# Patient Record
Sex: Male | Born: 1945 | Race: White | Hispanic: No | Marital: Single | Smoking: Former smoker
Health system: Southern US, Community
[De-identification: ages and names within clinical notes are randomized; demographics above are authoritative.]

## PROBLEM LIST (undated history)

## (undated) DIAGNOSIS — Z87891 Personal history of nicotine dependence: Secondary | ICD-10-CM

---

## 2014-10-28 ENCOUNTER — Inpatient Hospital Stay (HOSPITAL_COMMUNITY): Payer: Medicare Other

## 2014-10-28 ENCOUNTER — Encounter (HOSPITAL_COMMUNITY): Payer: Self-pay | Admitting: Anesthesiology

## 2014-10-28 ENCOUNTER — Inpatient Hospital Stay (HOSPITAL_COMMUNITY)
Admission: AD | Admit: 2014-10-28 | Discharge: 2014-11-09 | DRG: 200 | Disposition: A | Discharge status: Home / Self Care | Payer: Medicare Other | Source: Other Acute Inpatient Hospital | Attending: Cardiothoracic Surgery | Admitting: Cardiothoracic Surgery

## 2014-10-28 DIAGNOSIS — D62 Acute posthemorrhagic anemia: Secondary | ICD-10-CM | POA: Diagnosis not present

## 2014-10-28 DIAGNOSIS — Z9689 Presence of other specified functional implants: Secondary | ICD-10-CM

## 2014-10-28 DIAGNOSIS — I10 Essential (primary) hypertension: Secondary | ICD-10-CM | POA: Diagnosis present

## 2014-10-28 DIAGNOSIS — Z4682 Encounter for fitting and adjustment of non-vascular catheter: Secondary | ICD-10-CM

## 2014-10-28 DIAGNOSIS — R0602 Shortness of breath: Secondary | ICD-10-CM

## 2014-10-28 DIAGNOSIS — D72829 Elevated white blood cell count, unspecified: Secondary | ICD-10-CM | POA: Diagnosis not present

## 2014-10-28 DIAGNOSIS — I509 Heart failure, unspecified: Secondary | ICD-10-CM | POA: Diagnosis present

## 2014-10-28 DIAGNOSIS — J449 Chronic obstructive pulmonary disease, unspecified: Secondary | ICD-10-CM | POA: Diagnosis present

## 2014-10-28 DIAGNOSIS — Z9981 Dependence on supplemental oxygen: Secondary | ICD-10-CM

## 2014-10-28 DIAGNOSIS — R911 Solitary pulmonary nodule: Secondary | ICD-10-CM | POA: Diagnosis present

## 2014-10-28 DIAGNOSIS — T8182XA Emphysema (subcutaneous) resulting from a procedure, initial encounter: Secondary | ICD-10-CM | POA: Diagnosis present

## 2014-10-28 DIAGNOSIS — IMO0002 Reserved for concepts with insufficient information to code with codable children: Secondary | ICD-10-CM | POA: Diagnosis present

## 2014-10-28 DIAGNOSIS — Y828 Other medical devices associated with adverse incidents: Secondary | ICD-10-CM | POA: Diagnosis present

## 2014-10-28 DIAGNOSIS — Z86718 Personal history of other venous thrombosis and embolism: Secondary | ICD-10-CM

## 2014-10-28 DIAGNOSIS — J9382 Other air leak: Secondary | ICD-10-CM | POA: Diagnosis present

## 2014-10-28 DIAGNOSIS — J439 Emphysema, unspecified: Secondary | ICD-10-CM | POA: Diagnosis present

## 2014-10-28 DIAGNOSIS — J9381 Chronic pneumothorax: Secondary | ICD-10-CM

## 2014-10-28 DIAGNOSIS — J939 Pneumothorax, unspecified: Secondary | ICD-10-CM

## 2014-10-28 DIAGNOSIS — Z87891 Personal history of nicotine dependence: Secondary | ICD-10-CM | POA: Diagnosis not present

## 2014-10-28 DIAGNOSIS — R739 Hyperglycemia, unspecified: Secondary | ICD-10-CM | POA: Diagnosis not present

## 2014-10-28 DIAGNOSIS — C3490 Malignant neoplasm of unspecified part of unspecified bronchus or lung: Secondary | ICD-10-CM

## 2014-10-28 DIAGNOSIS — J95812 Postprocedural air leak: Secondary | ICD-10-CM | POA: Diagnosis present

## 2014-10-28 DIAGNOSIS — J984 Other disorders of lung: Secondary | ICD-10-CM

## 2014-10-28 HISTORY — DX: Personal history of nicotine dependence: Z87.891

## 2014-10-28 LAB — COMPREHENSIVE METABOLIC PANEL
ALT: 21 U/L (ref 0–53)
AST: 28 U/L (ref 0–37)
Albumin: 2.9 g/dL — ABNORMAL LOW (ref 3.5–5.2)
Alkaline Phosphatase: 45 U/L (ref 39–117)
Anion gap: 7 (ref 5–15)
BUN: 25 mg/dL — ABNORMAL HIGH (ref 6–23)
CO2: 24 mmol/L (ref 19–32)
Calcium: 7.9 mg/dL — ABNORMAL LOW (ref 8.4–10.5)
Chloride: 105 mmol/L (ref 96–112)
Creatinine, Ser: 1 mg/dL (ref 0.50–1.35)
GFR calc Af Amer: 86 mL/min — ABNORMAL LOW (ref >90)
GFR calc non Af Amer: 74 mL/min — ABNORMAL LOW (ref >90)
Glucose, Bld: 111 mg/dL — ABNORMAL HIGH (ref 70–99)
Potassium: 3.4 mmol/L — ABNORMAL LOW (ref 3.5–5.1)
Sodium: 136 mmol/L (ref 135–145)
Total Bilirubin: 1.3 mg/dL — ABNORMAL HIGH (ref 0.3–1.2)
Total Protein: 5.6 g/dL — ABNORMAL LOW (ref 6.0–8.3)

## 2014-10-28 LAB — URINALYSIS, ROUTINE W REFLEX MICROSCOPIC
Bilirubin Urine: NEGATIVE
Glucose, UA: NEGATIVE mg/dL
Ketones, ur: NEGATIVE mg/dL
Leukocytes, UA: NEGATIVE
Nitrite: NEGATIVE
Protein, ur: NEGATIVE mg/dL
Specific Gravity, Urine: 1.016 (ref 1.005–1.030)
Urobilinogen, UA: 0.2 mg/dL (ref 0.0–1.0)
pH: 8 (ref 5.0–8.0)

## 2014-10-28 LAB — CBC
HCT: 42.5 % (ref 39.0–52.0)
Hemoglobin: 14.2 g/dL (ref 13.0–17.0)
MCH: 29.6 pg (ref 26.0–34.0)
MCHC: 33.4 g/dL (ref 30.0–36.0)
MCV: 88.5 fL (ref 78.0–100.0)
Platelets: 289 10*3/uL (ref 150–400)
RBC: 4.8 MIL/uL (ref 4.22–5.81)
RDW: 15.1 % (ref 11.5–15.5)
WBC: 15 10*3/uL — ABNORMAL HIGH (ref 4.0–10.5)

## 2014-10-28 LAB — BLOOD GAS, ARTERIAL
ACID-BASE EXCESS: 3.8 mmol/L — AB (ref 0.0–2.0)
BICARBONATE: 27.3 meq/L — AB (ref 20.0–24.0)
Drawn by: 12971
O2 CONTENT: 1 L/min
O2 SAT: 90.2 %
PATIENT TEMPERATURE: 98.6
PCO2 ART: 38.1 mmHg (ref 35.0–45.0)
PH ART: 7.469 — AB (ref 7.350–7.450)
TCO2: 28.5 mmol/L (ref 0–100)
pO2, Arterial: 58.3 mmHg — ABNORMAL LOW (ref 80.0–100.0)

## 2014-10-28 LAB — URINE MICROSCOPIC-ADD ON

## 2014-10-28 LAB — MRSA PCR SCREENING: MRSA by PCR: NEGATIVE

## 2014-10-28 LAB — APTT: aPTT: 31 seconds (ref 24–37)

## 2014-10-28 LAB — TYPE AND SCREEN
ABO/RH(D): A POS
Antibody Screen: NEGATIVE

## 2014-10-28 LAB — SURGICAL PCR SCREEN
MRSA, PCR: NEGATIVE
Staphylococcus aureus: NEGATIVE

## 2014-10-28 LAB — ABO/RH: ABO/RH(D): A POS

## 2014-10-28 MED ORDER — DEXTROSE 5 % IV SOLN
1.5000 g | INTRAVENOUS | Status: AC
  Administered 2014-10-29: 1.5 g via INTRAVENOUS
  Filled 2014-10-28: qty 1.5

## 2014-10-28 MED ORDER — IOHEXOL 300 MG/ML  SOLN
80.0000 mL | Freq: Once | INTRAMUSCULAR | Status: AC | PRN
  Administered 2014-10-28: 80 mL via INTRAVENOUS

## 2014-10-28 MED ORDER — CETYLPYRIDINIUM CHLORIDE 0.05 % MT LIQD
7.0000 mL | Freq: Two times a day (BID) | OROMUCOSAL | Status: DC
  Administered 2014-10-28: 7 mL via OROMUCOSAL

## 2014-10-28 MED ORDER — BUDESONIDE-FORMOTEROL FUMARATE 160-4.5 MCG/ACT IN AERO
2.0000 | INHALATION_SPRAY | Freq: Two times a day (BID) | RESPIRATORY_TRACT | Status: DC
  Administered 2014-10-28 – 2014-11-09 (×24): 2 via RESPIRATORY_TRACT
  Filled 2014-10-28 (×2): qty 6

## 2014-10-28 MED ORDER — LEVALBUTEROL HCL 0.63 MG/3ML IN NEBU
0.6300 mg | INHALATION_SOLUTION | Freq: Four times a day (QID) | RESPIRATORY_TRACT | Status: DC | PRN
  Administered 2014-10-31: 0.63 mg via RESPIRATORY_TRACT
  Filled 2014-10-28: qty 3

## 2014-10-28 MED ORDER — ACETAMINOPHEN 650 MG RE SUPP: 650.0000 mg | Freq: Four times a day (QID) | RECTAL | Status: DC | PRN

## 2014-10-28 MED ORDER — TIOTROPIUM BROMIDE MONOHYDRATE 18 MCG IN CAPS
18.0000 ug | ORAL_CAPSULE | Freq: Every day | RESPIRATORY_TRACT | Status: DC
  Filled 2014-10-28: qty 5

## 2014-10-28 MED ORDER — DEXTROSE-NACL 5-0.9 % IV SOLN
INTRAVENOUS | Status: DC
  Administered 2014-10-28: 75 mL/h via INTRAVENOUS

## 2014-10-28 MED ORDER — POTASSIUM CHLORIDE CRYS ER 20 MEQ PO TBCR
20.0000 meq | EXTENDED_RELEASE_TABLET | ORAL | Status: DC | PRN
  Administered 2014-10-28 – 2014-10-29 (×2): 20 meq via ORAL
  Filled 2014-10-28 (×2): qty 1

## 2014-10-28 MED ORDER — SODIUM CHLORIDE 0.9 % IJ SOLN
3.0000 mL | Freq: Two times a day (BID) | INTRAMUSCULAR | Status: DC
Start: 2014-10-28 — End: 2014-10-29
  Administered 2014-10-28 (×2): 3 mL via INTRAVENOUS

## 2014-10-28 MED ORDER — PANTOPRAZOLE SODIUM 40 MG PO TBEC
40.0000 mg | DELAYED_RELEASE_TABLET | Freq: Every day | ORAL | Status: DC
Start: 2014-10-28 — End: 2014-10-29
  Administered 2014-10-28: 40 mg via ORAL
  Filled 2014-10-28: qty 1

## 2014-10-28 MED ORDER — LEVALBUTEROL HCL 0.63 MG/3ML IN NEBU
INHALATION_SOLUTION | RESPIRATORY_TRACT | Status: AC
  Administered 2014-10-28: 0.63 mg
  Filled 2014-10-28: qty 3

## 2014-10-28 MED ORDER — ACETAMINOPHEN 325 MG PO TABS: 650.0000 mg | ORAL_TABLET | Freq: Four times a day (QID) | ORAL | Status: DC | PRN

## 2014-10-28 MED ORDER — HYDROCODONE-ACETAMINOPHEN 5-325 MG PO TABS
1.0000 | ORAL_TABLET | ORAL | Status: DC | PRN
Start: 2014-10-28 — End: 2014-10-29

## 2014-10-28 MED ORDER — ONDANSETRON HCL 4 MG PO TABS
4.0000 mg | ORAL_TABLET | Freq: Four times a day (QID) | ORAL | Status: DC | PRN
Start: 2014-10-28 — End: 2014-10-29

## 2014-10-28 MED ORDER — FLUTICASONE PROPIONATE 50 MCG/ACT NA SUSP
1.0000 | NASAL | Status: DC | PRN
  Filled 2014-10-28: qty 16

## 2014-10-28 MED ORDER — ONDANSETRON HCL 4 MG/2ML IJ SOLN: 4.0000 mg | Freq: Four times a day (QID) | INTRAMUSCULAR | Status: DC | PRN

## 2014-10-28 MED ORDER — LEVOFLOXACIN IN D5W 750 MG/150ML IV SOLN
750.0000 mg | INTRAVENOUS | Status: DC
  Administered 2014-10-28: 750 mg via INTRAVENOUS
  Filled 2014-10-28 (×2): qty 150

## 2014-10-28 MED ORDER — METHYLPREDNISOLONE SODIUM SUCC 125 MG IJ SOLR
125.0000 mg | Freq: Every day | INTRAMUSCULAR | Status: AC
  Administered 2014-10-28: 125 mg via INTRAVENOUS
  Filled 2014-10-28: qty 2

## 2014-10-28 MED ORDER — HYDRALAZINE HCL 20 MG/ML IJ SOLN
10.0000 mg | Freq: Four times a day (QID) | INTRAMUSCULAR | Status: DC | PRN
  Administered 2014-10-28 (×2): 10 mg via INTRAVENOUS
  Filled 2014-10-28 (×2): qty 1

## 2014-10-28 MED ORDER — FENTANYL CITRATE 0.05 MG/ML IJ SOLN: 12.5000 ug | INTRAMUSCULAR | Status: DC | PRN

## 2014-10-28 MED ORDER — DEXTROSE-NACL 5-0.9 % IV SOLN: INTRAVENOUS | Status: DC

## 2014-10-28 MED ORDER — LEVALBUTEROL HCL 0.63 MG/3ML IN NEBU: 0.6300 mg | INHALATION_SOLUTION | Freq: Once | RESPIRATORY_TRACT | Status: DC

## 2014-10-28 MED ORDER — DOCUSATE SODIUM 100 MG PO CAPS
100.0000 mg | ORAL_CAPSULE | Freq: Two times a day (BID) | ORAL | Status: DC
  Administered 2014-10-28: 100 mg via ORAL
  Filled 2014-10-28: qty 1

## 2014-10-28 NOTE — H&P (Signed)
[address]       [place] [address]             [phone]        Gagan Samano Gilchrist Medical Record #[id number] Date of Birth: Jul 05, 1946  Referring: Dr. Leory Plowman Primary Care: No primary care provider on file.  Chief Complaint:   Left pneumothorax with persistent air leak.   History of Present Illness:     This is a 69 year old Caucasian male with a history of a left lung nodule (apparently followed by [place] in [place] previously) COPD, tobacco abuse (quit in 1994) , and hypertension. He woke up around 1 a.m. on 10/24/2014, and after letting the dog outside, he felt a sharp pain in his chest and had difficulty breathing. He summonsed EMS and was taken to [facility]. A chest x ray done in the ER showed a large left pneumothorax. A chest tube was placed and he was transferred to the ICU.      According to medical records, there was air leak after placement of the chest tube. The chest tube was replaced. The air leak gradually resolved. The left chest tube was placed to water seal on 10/27/2014 and there was a small air leak noted. On 10/28/2014, he developed marked increase in subcutaneous emphysema and the air leak was worse. The left chest tube was placed back to suction. It was felt he needed to be evaluated by a thoracic surgeon. Dr. Prescott Gum accepted the patient in transfer to [facility] for further evaluation and treatment.      Currently, the patient is is no acute distress, he is on 2 liters of oxygen via [place] with oxygen saturation of 95%. His BP is 176/107 and Hydralazine has been ordered. His heart rate is in the hight 90's low 100's.  Current Activity/ Functional Status: Patient is independent with mobility/ambulation, transfers, ADL's, IADL's.   Zubrod Score: At the time of surgery this patient's most appropriate activity status/level should be described as: [x]     0    Normal activity, no symptoms []     1    Restricted in  physical strenuous activity but ambulatory, able to do out light work []     2    Ambulatory and capable of self care, unable to do work activities, up and about                 more than 50%  Of the time                            []     3    Only limited self care, in bed greater than 50% of waking hours []     4    Completely disabled, no self care, confined to bed or chair []     5    Moribund   Allergies: NKDA  Past Medical History: 1. COPD 2. Hypertension 3. Left lung nodule  Past Surgical History: Denies  History  Smoking status  . Previous tobacco use. Quit in 1994.  Smokeless tobacco  . Not on file    History  Alcohol Use: Denies Not on file    History   Social History  . Marital Status: Divorced. Has a girlfriend who lives with him.    Spouse Name: N/A  . Number of Children: 3  . Years of Education: N/A   Occupational  History  . Not on file.   Medications taken prior to admission: 1. Albuterol Inhaler 1-2 puffs Q 4 hours PRN 2. Spiriva respimat 1.25 mcg 1-2 puffs daily 3. Symbicort 160/4.5 2 puffs bid 4. Lisinopril 20 daily  Medications at [facility]: 1. Albuterol 2.5 Q 6 hours 2. Methyl Prednisone 40 IV daily 3. Levaquin 750 mg IV daily 4. Lisinopril 20 mg po bid 5. Ecasa 81 mg po daily 6. Protonix 40 mg daily  Family History: Mother is deceased. She died at age 32. No known medical problems. Father is deceased and died at age 41 from brain aneurysm.  Review of Systems:     Cardiac Review of Systems: Y or N  Chest Pain [ N   ]  Resting SOB [ N  ] Exertional SOB  [ Y ]  Orthopnea [ N ]   Pedal Edema [ N  ]    Palpitations [ N ] Syncope  [ N ]   Presyncope Aqua.Slicker   ]  General Review of Systems: [Y] = yes [  ]=no Constitional: recent weight change [ N ]; anorexia [ N ]; fatigue [  ]; nausea [ N ]; night sweats [ N ]; fever [N  ]; or chills Aqua.Slicker  ]                                                 Eye : blurred vision [ N ]; diplopia [  N ]; vision  changes [ N ];  Amaurosis fugax[ N ]; Resp: cough [ N ];  wheezing[ N ];  hemoptysis[  N];  GI:  vomiting[ N ];  dysphagia[ N ]; melena[ N ];  hematochezia [ N ]; heartburn[ N ] GU:  hematuria[ N ];   dysuria Aqua.Slicker  ];  nocturia[ N ];  history of obstruction [ N ]; urinary frequency [N  ]             Skin: rash, swelling[ N ];, hair loss[ N ]; or itching[  N]; Musculosketetal: myalgias[N ];  joint swelling[ N ];  joint erythema[ N ]; joint pain[  N];  back pain[N  ];  Heme/Lymph: bruising[ N ];  bleeding[N  ];  anemia[ N ];  Neuro: TIA[N  ];  headaches[N  ];  stroke[  ]N;  vertigo[N  ];  seizures[N  ];   paresthesias[ N ];  difficulty walking[ N ];  Psych:depression[N  ]; anxiety[ N ];  Endocrine: diabetes[N  ];  thyroid dysfunction[N  ];    Physical Exam: BP 175/81 mmHg  Pulse 100  Temp(Src) 98.3 F (36.8 C) (Oral)  Resp 17  Ht 5\' 7"  (1.702 m)  Wt 146 lb 8 oz (66.452 kg)  BMI 22.94 kg/m2  SpO2 96%   General appearance: alert, cooperative and no distress Head: Normocephalic, without obvious abnormality, atraumatic Neck: no adenopathy, no carotid bruit, no JVD, supple, symmetrical, trachea midline, thyroid not enlarged, symmetric, no tenderness/mass/nodules and Swelling (subcutaneous emphysema), no carotid bruit Resp: Slightly diminished on left;Right lung is mostly clear. Subcutaneous emphysema bilateral neck, face, chest, and mostly left eye Cardio: RRR GI: soft, non-tender; bowel sounds normal; no masses,  no organomegaly Extremities: No LE edema, pulses intact Neurologic: Grossly normal  Diagnostic Studies & Laboratory data:  Await results   Recent Radiology Findings:   CXR ordered.  Recent Lab Findings: No results found for: WBC, HGB, HCT, PLT, GLUCOSE, CHOL, TRIG, HDL, LDLDIRECT, LDLCALC, ALT, AST, NA, K, CL, CREATININE, BUN, CO2, TSH, INR, GLUF, HGBA1C  Assessment / Plan:      1. Spontaneous left pneumothorax.S/p left chest tube placement with persistent air leak.   Subcutaneous emphysema. Spontaneous left pneumothorax likely secondary to ruptured bleb. Await CT scan. 3. History of COPD-Xopenex, Spiriva, and Symbicort ordered. He was given Methyl prednisone at [facility]. Dr. Prescott Gum will order steroid taper dose. 4. History of left lung nodule. Await CT scan. 5. History of hypertension-hydralazine PRN. Will re start Lisinopril after surgery  I  spent 20 minutes counseling the patient face to face and 50% or more the  time was spent in counseling and coordination of care. The total time spent in the appointment was 45 minutes.   Lars Pinks PA-C  10/28/2014 1:54 PM   Patient examined and radiographic studies personally reviewed The patient has a spontaneous left pneumothorax from COPD and persistent air leak despite chest tube placement. The patient will need left VATS with probable  bleb stapling and pleurodesis . Chest CT scan is pending preoperatively. I reviewed the procedure with the patient in detail and he understands the benefits and risks and alternatives and agrees to proceed with surgery

## 2014-10-28 NOTE — Progress Notes (Signed)
VASCULAR LAB PRELIMINARY  PRELIMINARY  PRELIMINARY  PRELIMINARY  Bilateral Lower Extremity Venous Duplex completed.    Preliminary report:  Negative for DVT and / or Baker's Cyst bilaterally.  Lindwood Coke, RVT 10/28/2014, 3:10 PM

## 2014-10-29 ENCOUNTER — Encounter (HOSPITAL_COMMUNITY)
Admission: AD | Disposition: A | Discharge status: Home / Self Care | Payer: Self-pay | Source: Other Acute Inpatient Hospital | Attending: Cardiothoracic Surgery

## 2014-10-29 ENCOUNTER — Inpatient Hospital Stay (HOSPITAL_COMMUNITY): Payer: Medicare Other | Admitting: Anesthesiology

## 2014-10-29 ENCOUNTER — Inpatient Hospital Stay (HOSPITAL_COMMUNITY): Payer: Medicare Other

## 2014-10-29 DIAGNOSIS — J439 Emphysema, unspecified: Secondary | ICD-10-CM | POA: Diagnosis present

## 2014-10-29 HISTORY — PX: VIDEO ASSISTED THORACOSCOPY: SHX5073

## 2014-10-29 HISTORY — PX: STAPLING OF BLEBS: SHX6429

## 2014-10-29 HISTORY — PX: PLEURADESIS: SHX6030

## 2014-10-29 LAB — COMPREHENSIVE METABOLIC PANEL
ALT: 23 U/L (ref 0–53)
AST: 28 U/L (ref 0–37)
Albumin: 3 g/dL — ABNORMAL LOW (ref 3.5–5.2)
Alkaline Phosphatase: 51 U/L (ref 39–117)
Anion gap: 8 (ref 5–15)
BUN: 26 mg/dL — ABNORMAL HIGH (ref 6–23)
CO2: 25 mmol/L (ref 19–32)
Calcium: 8 mg/dL — ABNORMAL LOW (ref 8.4–10.5)
Chloride: 102 mmol/L (ref 96–112)
Creatinine, Ser: 1.05 mg/dL (ref 0.50–1.35)
GFR calc Af Amer: 81 mL/min — ABNORMAL LOW (ref >90)
GFR calc non Af Amer: 70 mL/min — ABNORMAL LOW (ref >90)
Glucose, Bld: 200 mg/dL — ABNORMAL HIGH (ref 70–99)
Potassium: 4.4 mmol/L (ref 3.5–5.1)
Sodium: 135 mmol/L (ref 135–145)
Total Bilirubin: 1.3 mg/dL — ABNORMAL HIGH (ref 0.3–1.2)
Total Protein: 6.2 g/dL (ref 6.0–8.3)

## 2014-10-29 LAB — CBC
HCT: 44.6 % (ref 39.0–52.0)
Hemoglobin: 15.3 g/dL (ref 13.0–17.0)
MCH: 30.1 pg (ref 26.0–34.0)
MCHC: 34.3 g/dL (ref 30.0–36.0)
MCV: 87.8 fL (ref 78.0–100.0)
Platelets: 322 10*3/uL (ref 150–400)
RBC: 5.08 MIL/uL (ref 4.22–5.81)
RDW: 15.2 % (ref 11.5–15.5)
WBC: 13 10*3/uL — ABNORMAL HIGH (ref 4.0–10.5)

## 2014-10-29 LAB — POCT I-STAT 3, ART BLOOD GAS (G3+)
Bicarbonate: 24.7 mEq/L — ABNORMAL HIGH (ref 20.0–24.0)
O2 Saturation: 95 %
Patient temperature: 98.2
TCO2: 26 mmol/L (ref 0–100)
pCO2 arterial: 40.3 mmHg (ref 35.0–45.0)
pH, Arterial: 7.394 (ref 7.350–7.450)
pO2, Arterial: 75 mmHg — ABNORMAL LOW (ref 80.0–100.0)

## 2014-10-29 LAB — HEMOGLOBIN A1C
Hgb A1c MFr Bld: 6.5 % — ABNORMAL HIGH (ref 4.8–5.6)
MEAN PLASMA GLUCOSE: 140 mg/dL

## 2014-10-29 LAB — PROTIME-INR
INR: 1.12 (ref 0.00–1.49)
Prothrombin Time: 14.6 seconds (ref 11.6–15.2)

## 2014-10-29 SURGERY — VIDEO ASSISTED THORACOSCOPY
Anesthesia: General | Site: Chest | Laterality: Left

## 2014-10-29 MED ORDER — CHLORHEXIDINE GLUCONATE 0.12 % MT SOLN
15.0000 mL | Freq: Two times a day (BID) | OROMUCOSAL | Status: DC
  Administered 2014-10-29 – 2014-10-30 (×3): 15 mL via OROMUCOSAL
  Filled 2014-10-29 (×3): qty 15

## 2014-10-29 MED ORDER — CETYLPYRIDINIUM CHLORIDE 0.05 % MT LIQD
7.0000 mL | Freq: Two times a day (BID) | OROMUCOSAL | Status: DC
  Administered 2014-10-30 – 2014-11-08 (×15): 7 mL via OROMUCOSAL

## 2014-10-29 MED ORDER — HEMOSTATIC AGENTS (NO CHARGE) OPTIME
TOPICAL | Status: DC | PRN
  Administered 2014-10-29: 1 via TOPICAL

## 2014-10-29 MED ORDER — DIPHENHYDRAMINE HCL 12.5 MG/5ML PO ELIX
12.5000 mg | ORAL_SOLUTION | Freq: Four times a day (QID) | ORAL | Status: DC | PRN
  Filled 2014-10-29: qty 5

## 2014-10-29 MED ORDER — POTASSIUM CHLORIDE 10 MEQ/50ML IV SOLN
10.0000 meq | Freq: Every day | INTRAVENOUS | Status: DC | PRN
  Filled 2014-10-29: qty 50

## 2014-10-29 MED ORDER — ROCURONIUM BROMIDE 100 MG/10ML IV SOLN
INTRAVENOUS | Status: DC | PRN
  Administered 2014-10-29: 20 mg via INTRAVENOUS
  Administered 2014-10-29: 30 mg via INTRAVENOUS

## 2014-10-29 MED ORDER — OXYCODONE HCL 5 MG/5ML PO SOLN: 5.0000 mg | Freq: Once | ORAL | Status: DC | PRN

## 2014-10-29 MED ORDER — 0.9 % SODIUM CHLORIDE (POUR BTL) OPTIME
TOPICAL | Status: DC | PRN
  Administered 2014-10-29: 2000 mL

## 2014-10-29 MED ORDER — LACTATED RINGERS IV SOLN
INTRAVENOUS | Status: DC | PRN
  Administered 2014-10-29: 09:00:00 via INTRAVENOUS

## 2014-10-29 MED ORDER — FENTANYL CITRATE 0.05 MG/ML IJ SOLN
INTRAMUSCULAR | Status: AC
  Filled 2014-10-29: qty 5

## 2014-10-29 MED ORDER — OXYCODONE HCL 5 MG PO TABS: 5.0000 mg | ORAL_TABLET | Freq: Once | ORAL | Status: DC | PRN

## 2014-10-29 MED ORDER — DEXTROSE-NACL 5-0.9 % IV SOLN
INTRAVENOUS | Status: DC
  Administered 2014-10-29 – 2014-10-30 (×2): via INTRAVENOUS

## 2014-10-29 MED ORDER — METHYLPREDNISOLONE SODIUM SUCC 125 MG IJ SOLR
50.0000 mg | Freq: Once | INTRAMUSCULAR | Status: AC
  Administered 2014-10-29: 50 mg via INTRAVENOUS
  Filled 2014-10-29: qty 2

## 2014-10-29 MED ORDER — ACETAMINOPHEN 325 MG PO TABS: 325.0000 mg | ORAL_TABLET | ORAL | Status: DC | PRN

## 2014-10-29 MED ORDER — ACETAMINOPHEN 160 MG/5ML PO SOLN
1000.0000 mg | Freq: Four times a day (QID) | ORAL | Status: AC
  Filled 2014-10-29: qty 40

## 2014-10-29 MED ORDER — NALOXONE HCL 0.4 MG/ML IJ SOLN: 0.4000 mg | INTRAMUSCULAR | Status: DC | PRN

## 2014-10-29 MED ORDER — MIDAZOLAM HCL 5 MG/5ML IJ SOLN
INTRAMUSCULAR | Status: DC | PRN
  Administered 2014-10-29: 0.5 mg via INTRAVENOUS

## 2014-10-29 MED ORDER — GLYCOPYRROLATE 0.2 MG/ML IJ SOLN
INTRAMUSCULAR | Status: DC | PRN
  Administered 2014-10-29: .7 mg via INTRAVENOUS

## 2014-10-29 MED ORDER — PROPOFOL 10 MG/ML IV BOLUS
INTRAVENOUS | Status: DC | PRN
  Administered 2014-10-29: 30 mg via INTRAVENOUS
  Administered 2014-10-29: 80 mg via INTRAVENOUS

## 2014-10-29 MED ORDER — TALC 5 G PL SUSR
INTRAPLEURAL | Status: DC | PRN
  Administered 2014-10-29: 5 g via INTRAPLEURAL

## 2014-10-29 MED ORDER — FENTANYL 10 MCG/ML IV SOLN
INTRAVENOUS | Status: DC
  Administered 2014-10-29: 0 ug via INTRAVENOUS
  Administered 2014-10-29: 70 ug via INTRAVENOUS
  Administered 2014-10-29: 10 ug via INTRAVENOUS
  Administered 2014-10-30: 20 ug via INTRAVENOUS
  Administered 2014-10-30: 90 ug via INTRAVENOUS
  Administered 2014-10-30: 160 ug via INTRAVENOUS
  Administered 2014-10-30: 50 ug via INTRAVENOUS
  Administered 2014-10-30: 20:00:00 via INTRAVENOUS
  Administered 2014-10-30: 60 ug via INTRAVENOUS
  Administered 2014-10-30: 20 ug via INTRAVENOUS
  Administered 2014-10-31: 60 ug via INTRAVENOUS
  Administered 2014-10-31: 40 ug via INTRAVENOUS
  Administered 2014-10-31: 30 ug via INTRAVENOUS
  Administered 2014-10-31: 40 ug via INTRAVENOUS
  Administered 2014-10-31: 60 ug via INTRAVENOUS
  Administered 2014-10-31: 10 ug via INTRAVENOUS
  Administered 2014-10-31: 70 ug via INTRAVENOUS
  Administered 2014-11-01: 60 ug via INTRAVENOUS
  Administered 2014-11-01: 10 ug via INTRAVENOUS
  Filled 2014-10-29 (×2): qty 50

## 2014-10-29 MED ORDER — SENNOSIDES-DOCUSATE SODIUM 8.6-50 MG PO TABS
1.0000 | ORAL_TABLET | Freq: Every day | ORAL | Status: DC
  Administered 2014-10-31 – 2014-11-03 (×4): 1 via ORAL
  Filled 2014-10-29 (×13): qty 1

## 2014-10-29 MED ORDER — SODIUM CHLORIDE 0.9 % IJ SOLN: 9.0000 mL | INTRAMUSCULAR | Status: DC | PRN

## 2014-10-29 MED ORDER — DIPHENHYDRAMINE HCL 50 MG/ML IJ SOLN: 12.5000 mg | Freq: Four times a day (QID) | INTRAMUSCULAR | Status: DC | PRN

## 2014-10-29 MED ORDER — PROPOFOL 10 MG/ML IV BOLUS
INTRAVENOUS | Status: AC
  Filled 2014-10-29: qty 20

## 2014-10-29 MED ORDER — BISACODYL 5 MG PO TBEC
10.0000 mg | DELAYED_RELEASE_TABLET | Freq: Every day | ORAL | Status: DC
  Administered 2014-10-29 – 2014-11-09 (×8): 10 mg via ORAL
  Filled 2014-10-29 (×11): qty 2

## 2014-10-29 MED ORDER — CETYLPYRIDINIUM CHLORIDE 0.05 % MT LIQD: 7.0000 mL | Freq: Two times a day (BID) | OROMUCOSAL | Status: DC

## 2014-10-29 MED ORDER — ACETAMINOPHEN 500 MG PO TABS
1000.0000 mg | ORAL_TABLET | Freq: Four times a day (QID) | ORAL | Status: AC
  Administered 2014-10-29 – 2014-11-03 (×19): 1000 mg via ORAL
  Filled 2014-10-29 (×22): qty 2

## 2014-10-29 MED ORDER — VECURONIUM BROMIDE 10 MG IV SOLR
INTRAVENOUS | Status: DC | PRN
  Administered 2014-10-29: 2 mg via INTRAVENOUS

## 2014-10-29 MED ORDER — PHENYLEPHRINE HCL 10 MG/ML IJ SOLN
10.0000 mg | INTRAMUSCULAR | Status: DC | PRN
  Administered 2014-10-29: 10 ug/min via INTRAVENOUS

## 2014-10-29 MED ORDER — ONDANSETRON HCL 4 MG/2ML IJ SOLN: 4.0000 mg | Freq: Four times a day (QID) | INTRAMUSCULAR | Status: DC | PRN

## 2014-10-29 MED ORDER — DEXTROSE 5 % IV SOLN
1.5000 g | Freq: Two times a day (BID) | INTRAVENOUS | Status: AC
  Administered 2014-10-29 – 2014-10-30 (×2): 1.5 g via INTRAVENOUS
  Filled 2014-10-29 (×2): qty 1.5

## 2014-10-29 MED ORDER — PHENYLEPHRINE HCL 10 MG/ML IJ SOLN
INTRAMUSCULAR | Status: DC | PRN
  Administered 2014-10-29 (×2): 80 ug via INTRAVENOUS

## 2014-10-29 MED ORDER — ACETAMINOPHEN 160 MG/5ML PO SOLN: 325.0000 mg | ORAL | Status: DC | PRN

## 2014-10-29 MED ORDER — FENTANYL CITRATE 0.05 MG/ML IJ SOLN
INTRAMUSCULAR | Status: DC | PRN
  Administered 2014-10-29: 50 ug via INTRAVENOUS
  Administered 2014-10-29 (×2): 100 ug via INTRAVENOUS
  Administered 2014-10-29: 50 ug via INTRAVENOUS
  Administered 2014-10-29 (×2): 25 ug via INTRAVENOUS

## 2014-10-29 MED ORDER — LIDOCAINE HCL (CARDIAC) 20 MG/ML IV SOLN
INTRAVENOUS | Status: DC | PRN
  Administered 2014-10-29: 60 mg via INTRAVENOUS

## 2014-10-29 MED ORDER — TALC 5 G PL SUSR
INTRAPLEURAL | Status: AC
  Filled 2014-10-29: qty 10

## 2014-10-29 MED ORDER — NEOSTIGMINE METHYLSULFATE 10 MG/10ML IV SOLN
INTRAVENOUS | Status: DC | PRN
  Administered 2014-10-29: 4 mg via INTRAVENOUS

## 2014-10-29 MED ORDER — HYDROMORPHONE HCL 1 MG/ML IJ SOLN
INTRAMUSCULAR | Status: AC
  Filled 2014-10-29: qty 1

## 2014-10-29 MED ORDER — HYDROMORPHONE HCL 1 MG/ML IJ SOLN
0.2500 mg | INTRAMUSCULAR | Status: DC | PRN
  Administered 2014-10-29 (×2): 0.5 mg via INTRAVENOUS

## 2014-10-29 MED ORDER — SUCCINYLCHOLINE CHLORIDE 20 MG/ML IJ SOLN
INTRAMUSCULAR | Status: DC | PRN
  Administered 2014-10-29: 60 mg via INTRAVENOUS

## 2014-10-29 MED ORDER — MIDAZOLAM HCL 2 MG/2ML IJ SOLN
INTRAMUSCULAR | Status: AC
  Filled 2014-10-29: qty 2

## 2014-10-29 SURGICAL SUPPLY — 82 items
APPLICATOR TIP EXT COSEAL (VASCULAR PRODUCTS) ×6 IMPLANT
BAG DECANTER FOR FLEXI CONT (MISCELLANEOUS) IMPLANT
BLADE SURG 11 STRL SS (BLADE) ×3 IMPLANT
CANISTER SUCTION 2500CC (MISCELLANEOUS) ×3 IMPLANT
CATH KIT ON Q 5IN SLV (PAIN MANAGEMENT) IMPLANT
CATH ROBINSON RED A/P 18FR (CATHETERS) ×3 IMPLANT
CATH ROBINSON RED A/P 22FR (CATHETERS) IMPLANT
CATH THORACIC 28FR (CATHETERS) ×3 IMPLANT
CATH THORACIC 36FR (CATHETERS) ×3 IMPLANT
CATH THORACIC 36FR RT ANG (CATHETERS) IMPLANT
CLEANER TIP ELECTROSURG 2X2 (MISCELLANEOUS) ×3 IMPLANT
CONN ST 1/4X3/8  BEN (MISCELLANEOUS) ×2
CONN ST 1/4X3/8 BEN (MISCELLANEOUS) ×1 IMPLANT
CONN Y 3/8X3/8X3/8  BEN (MISCELLANEOUS) ×2
CONN Y 3/8X3/8X3/8 BEN (MISCELLANEOUS) ×1 IMPLANT
CONT SPEC 4OZ CLIKSEAL STRL BL (MISCELLANEOUS) ×6 IMPLANT
CONT SPECI 4OZ STER CLIK (MISCELLANEOUS) ×12 IMPLANT
COUNTER NEEDLE 20 DBL MAG RED (NEEDLE) ×3 IMPLANT
COVER SURGICAL LIGHT HANDLE (MISCELLANEOUS) ×6 IMPLANT
DERMABOND ADVANCED (GAUZE/BANDAGES/DRESSINGS)
DERMABOND ADVANCED .7 DNX12 (GAUZE/BANDAGES/DRESSINGS) IMPLANT
DRAPE LAPAROSCOPIC ABDOMINAL (DRAPES) ×3 IMPLANT
DRAPE WARM FLUID 44X44 (DRAPE) ×3 IMPLANT
ELECT BLADE 4.0 EZ CLEAN MEGAD (MISCELLANEOUS) ×3
ELECT REM PT RETURN 9FT ADLT (ELECTROSURGICAL) ×3
ELECTRODE BLDE 4.0 EZ CLN MEGD (MISCELLANEOUS) ×1 IMPLANT
ELECTRODE REM PT RTRN 9FT ADLT (ELECTROSURGICAL) ×1 IMPLANT
GAUZE SPONGE 4X4 12PLY STRL (GAUZE/BANDAGES/DRESSINGS) ×3 IMPLANT
GLOVE BIO SURGEON STRL SZ7.5 (GLOVE) ×9 IMPLANT
GLOVE BIOGEL PI IND STRL 6.5 (GLOVE) ×1 IMPLANT
GLOVE BIOGEL PI IND STRL 7.0 (GLOVE) ×3 IMPLANT
GLOVE BIOGEL PI INDICATOR 6.5 (GLOVE) ×2
GLOVE BIOGEL PI INDICATOR 7.0 (GLOVE) ×6
GOWN STRL REUS W/ TWL LRG LVL3 (GOWN DISPOSABLE) ×3 IMPLANT
GOWN STRL REUS W/TWL LRG LVL3 (GOWN DISPOSABLE) ×6
HANDLE STAPLE ENDO GIA SHORT (STAPLE) ×4
KIT BASIN OR (CUSTOM PROCEDURE TRAY) ×3 IMPLANT
KIT ROOM TURNOVER OR (KITS) ×3 IMPLANT
KIT SUCTION CATH 14FR (SUCTIONS) ×6 IMPLANT
NS IRRIG 1000ML POUR BTL (IV SOLUTION) ×9 IMPLANT
PACK CHEST (CUSTOM PROCEDURE TRAY) ×3 IMPLANT
PAD ARMBOARD 7.5X6 YLW CONV (MISCELLANEOUS) ×6 IMPLANT
PENCIL BUTTON HOLSTER BLD 10FT (ELECTRODE) ×3 IMPLANT
RELOAD EGIA 45 MED/THCK PURPLE (STAPLE) ×6 IMPLANT
RELOAD EGIA 60 MED/THCK PURPLE (STAPLE) ×6 IMPLANT
RELOAD EGIA BLACK ROTIC 45MM (STAPLE) ×18 IMPLANT
SEALANT SURG COSEAL 4ML (VASCULAR PRODUCTS) IMPLANT
SOLUTION ANTI FOG 6CC (MISCELLANEOUS) ×3 IMPLANT
SPONGE GAUZE 4X4 12PLY STER LF (GAUZE/BANDAGES/DRESSINGS) ×3 IMPLANT
SPONGE TONSIL 1 RF SGL (DISPOSABLE) ×6 IMPLANT
SPONGE TONSIL 1.25 RF SGL STRG (GAUZE/BANDAGES/DRESSINGS) ×3 IMPLANT
STAPLER ENDO GIA 12MM SHORT (STAPLE) ×2 IMPLANT
SUT CHROMIC 3 0 SH 27 (SUTURE) ×6 IMPLANT
SUT CHROMIC 4 0 SH 27 (SUTURE) ×6 IMPLANT
SUT ETHILON 3 0 PS 1 (SUTURE) ×6 IMPLANT
SUT PROLENE 3 0 SH DA (SUTURE) IMPLANT
SUT PROLENE 4 0 RB 1 (SUTURE)
SUT PROLENE 4-0 RB1 .5 CRCL 36 (SUTURE) IMPLANT
SUT SILK  1 MH (SUTURE) ×6
SUT SILK 1 MH (SUTURE) ×3 IMPLANT
SUT SILK 2 0SH CR/8 30 (SUTURE) IMPLANT
SUT SILK 3 0SH CR/8 30 (SUTURE) IMPLANT
SUT VIC AB 1 CTX 18 (SUTURE) ×6 IMPLANT
SUT VIC AB 2 TP1 27 (SUTURE) IMPLANT
SUT VIC AB 2-0 CT1 27 (SUTURE) ×2
SUT VIC AB 2-0 CT1 TAPERPNT 27 (SUTURE) ×1 IMPLANT
SUT VIC AB 2-0 CT2 18 VCP726D (SUTURE) IMPLANT
SUT VIC AB 2-0 CTX 36 (SUTURE) ×3 IMPLANT
SUT VIC AB 3-0 SH 18 (SUTURE) ×6 IMPLANT
SUT VIC AB 3-0 X1 27 (SUTURE) ×9 IMPLANT
SUT VICRYL 0 UR6 27IN ABS (SUTURE) ×12 IMPLANT
SUT VICRYL 2 TP 1 (SUTURE) ×3 IMPLANT
SWAB COLLECTION DEVICE MRSA (MISCELLANEOUS) IMPLANT
SYSTEM SAHARA CHEST DRAIN ATS (WOUND CARE) ×3 IMPLANT
TAPE CLOTH SURG 4X10 WHT LF (GAUZE/BANDAGES/DRESSINGS) ×3 IMPLANT
TIP APPLICATOR SPRAY EXTEND 16 (VASCULAR PRODUCTS) IMPLANT
TOWEL OR 17X24 6PK STRL BLUE (TOWEL DISPOSABLE) ×3 IMPLANT
TOWEL OR 17X26 10 PK STRL BLUE (TOWEL DISPOSABLE) ×6 IMPLANT
TRAP SPECIMEN MUCOUS 40CC (MISCELLANEOUS) IMPLANT
TRAY FOLEY CATH 16FRSI W/METER (SET/KITS/TRAYS/PACK) IMPLANT
TUBE ANAEROBIC SPECIMEN COL (MISCELLANEOUS) IMPLANT
WATER STERILE IRR 1000ML POUR (IV SOLUTION) ×6 IMPLANT

## 2014-10-29 NOTE — Progress Notes (Signed)
The patient was examined and preop studies reviewed. There has been no change from the prior exam and the patient is ready for surgery.    Plan left VATS on [place] today

## 2014-10-29 NOTE — Progress Notes (Signed)
Patient ID: Alan Huff, male   DOB: 04/08/1946, 69 y.o.   MRN: [id number]  SICU Evening Rounds:  Hemodynamically stable sats 94% 1 chamber air leak   Pain under control. Urine output marginal

## 2014-10-29 NOTE — Transfer of Care (Signed)
Immediate Anesthesia Transfer of Care Note  Patient: Alan Huff  Procedure(s) Performed: Procedure(s): VIDEO ASSISTED THORACOSCOPY (Left) STAPLING OF BLEBS (Left) PLEURADESIS (Left)  Patient Location: PACU  Anesthesia Type:General  Level of Consciousness: awake and alert   Airway & Oxygen Therapy: Patient Spontanous Breathing and Patient connected to face mask oxygen  Post-op Assessment: Report given to RN and Post -op Vital signs reviewed and stable  Post vital signs: Reviewed and stable  Last Vitals:  Filed Vitals:   10/29/14 0600  BP: 135/69  Pulse: 91  Temp:   Resp: 12    Complications: No apparent anesthesia complications

## 2014-10-29 NOTE — Brief Op Note (Signed)
10/28/2014 - 10/29/2014      [address]       [place] [address]             [phone]     10/28/2014 - 10/29/2014  12:03 PM  PATIENT:  Alan Huff  69 y.o. male  PRE-OPERATIVE DIAGNOSIS:  PERSISTANT AIR LEAK IN CHEST TUBE LEFT PTX  POST-OPERATIVE DIAGNOSIS:  PERSISTANT AIR LEAK IN CHEST TUBE  PROCEDURE:  Procedure(s): VIDEO ASSISTED THORACOSCOPY/MINI-THORACOTOMY STAPLING OF BLEBS (LUL) PLEURODESIS(MECHANICAL AND TALC)  SURGEON:  Surgeon(s): Ivin Poot, MD  PHYSICIAN ASSISTANT: Antaeus Karel PA-C  ANESTHESIA:   general  SPECIMEN:  Source of Specimen:  3 LUL WEDGE RESECTIONS OF BLEBS  DISPOSITION OF SPECIMEN:  Pathology  DRAINS: 2 Chest Tube(s) in the LEFT HEMITHORAX   PATIENT CONDITION:  PACU - hemodynamically stable.  PRE-OPERATIVE WEIGHT: 66kg  EBL: MINIMAL  COMPLICATIONS: NO KNOWN

## 2014-10-29 NOTE — Anesthesia Procedure Notes (Signed)
Procedure Name: Intubation Date/Time: 10/29/2014 9:26 AM Performed by: Eligha Bridegroom Pre-anesthesia Checklist: Patient identified, Timeout performed, Emergency Drugs available, Suction available and Patient being monitored Patient Re-evaluated:Patient Re-evaluated prior to inductionOxygen Delivery Method: Circle system utilized Preoxygenation: Pre-oxygenation with 100% oxygen Intubation Type: IV induction Ventilation: Mask ventilation without difficulty Endobronchial tube: Left and 39 Fr Number of attempts: 1 Airway Equipment and Method: Stylet Placement Confirmation: ETT inserted through vocal cords under direct vision,  breath sounds checked- equal and bilateral and positive ETCO2 Tube secured with: Tape Dental Injury: Teeth and Oropharynx as per pre-operative assessment

## 2014-10-29 NOTE — Anesthesia Postprocedure Evaluation (Signed)
  Anesthesia Post-op Note  Patient: Alan Huff  Procedure(s) Performed: Procedure(s): VIDEO ASSISTED THORACOSCOPY (Left) STAPLING OF BLEBS (Left) PLEURADESIS (Left)  Patient Location: PACU  Anesthesia Type:General  Level of Consciousness: awake and alert   Airway and Oxygen Therapy: Patient Spontanous Breathing  Post-op Pain: none  Post-op Assessment: Post-op Vital signs reviewed  Post-op Vital Signs: Reviewed  Last Vitals:  Filed Vitals:   10/29/14 1900  BP: 121/56  Pulse: 90  Temp:   Resp: 20    Complications: No apparent anesthesia complications

## 2014-10-29 NOTE — Anesthesia Preprocedure Evaluation (Signed)
Anesthesia Evaluation  Patient identified by MRN, date of birth, ID band Patient awake    Reviewed: Allergy & Precautions, NPO status , Patient's Chart, lab work & pertinent test results  History of Anesthesia Complications Negative for: history of anesthetic complications  Airway Mallampati: II  TM Distance: >3 FB Neck ROM: Full    Dental  (+) Edentulous Upper, Edentulous Lower   Pulmonary COPD COPD inhaler, former smoker,  Left spontaneous ptx with air leak and diffuse subq emphysema   Diffuse subq emphysema with crackles on left side        Cardiovascular hypertension, Pt. on medications - angina- Past MI and - CHF Rhythm:Regular     Neuro/Psych negative neurological ROS  negative psych ROS   GI/Hepatic negative GI ROS, Neg liver ROS,   Endo/Other  negative endocrine ROS  Renal/GU negative Renal ROS     Musculoskeletal   Abdominal   Peds  Hematology   Anesthesia Other Findings   Reproductive/Obstetrics                             Anesthesia Physical Anesthesia Plan  ASA: III  Anesthesia Plan: General   Post-op Pain Management:    Induction: Intravenous  Airway Management Planned: Double Lumen EBT  Additional Equipment: Arterial line  Intra-op Plan:   Post-operative Plan: Extubation in OR  Informed Consent: I have reviewed the patients History and Physical, chart, labs and discussed the procedure including the risks, benefits and alternatives for the proposed anesthesia with the patient or authorized representative who has indicated his/her understanding and acceptance.     Plan Discussed with: CRNA and Surgeon  Anesthesia Plan Comments:         Anesthesia Quick Evaluation

## 2014-10-29 NOTE — Plan of Care (Signed)
Problem: Phase I Progression Outcomes Goal: Initial discharge plan identified Outcome: Completed/Met Date Met:  10/29/14 Home with wife.

## 2014-10-29 NOTE — Progress Notes (Signed)
Utilization Review Completed.Donne Anon T2/18/2016

## 2014-10-29 NOTE — Progress Notes (Signed)
Spoke to patient about PICC placement including risks, benefits, and alternatives. Patient asked to think about it overnight before making a decision. Primary RN aware.

## 2014-10-30 ENCOUNTER — Inpatient Hospital Stay (HOSPITAL_COMMUNITY): Payer: Medicare Other

## 2014-10-30 LAB — BLOOD GAS, ARTERIAL
Acid-Base Excess: 0.7 mmol/L (ref 0.0–2.0)
Bicarbonate: 24.5 mEq/L — ABNORMAL HIGH (ref 20.0–24.0)
DRAWN BY: 36496
FIO2: 0.36 %
O2 Saturation: 96.4 %
PCO2 ART: 37.7 mmHg (ref 35.0–45.0)
PH ART: 7.429 (ref 7.350–7.450)
PO2 ART: 92.8 mmHg (ref 80.0–100.0)
Patient temperature: 98.6
TCO2: 25.7 mmol/L (ref 0–100)

## 2014-10-30 LAB — GLUCOSE, CAPILLARY
Glucose-Capillary: 118 mg/dL — ABNORMAL HIGH (ref 70–99)
Glucose-Capillary: 111 mg/dL — ABNORMAL HIGH (ref 70–99)
Glucose-Capillary: 113 mg/dL — ABNORMAL HIGH (ref 70–99)

## 2014-10-30 LAB — CBC
HEMATOCRIT: 38.3 % — AB (ref 39.0–52.0)
HEMOGLOBIN: 12.6 g/dL — AB (ref 13.0–17.0)
MCH: 29.1 pg (ref 26.0–34.0)
MCHC: 32.9 g/dL (ref 30.0–36.0)
MCV: 88.5 fL (ref 78.0–100.0)
PLATELETS: 267 10*3/uL (ref 150–400)
RBC: 4.33 MIL/uL (ref 4.22–5.81)
RDW: 15 % (ref 11.5–15.5)
WBC: 15.6 10*3/uL — AB (ref 4.0–10.5)

## 2014-10-30 LAB — BASIC METABOLIC PANEL
ANION GAP: 5 (ref 5–15)
BUN: 32 mg/dL — ABNORMAL HIGH (ref 6–23)
CALCIUM: 7.5 mg/dL — AB (ref 8.4–10.5)
CO2: 25 mmol/L (ref 19–32)
CREATININE: 0.98 mg/dL (ref 0.50–1.35)
Chloride: 107 mmol/L (ref 96–112)
GFR calc Af Amer: >90 mL/min (ref >90)
GFR, EST NON AFRICAN AMERICAN: 81 mL/min — AB (ref >90)
Glucose, Bld: 179 mg/dL — ABNORMAL HIGH (ref 70–99)
Potassium: 4.1 mmol/L (ref 3.5–5.1)
SODIUM: 137 mmol/L (ref 135–145)

## 2014-10-30 MED ORDER — DEXTROSE 5 % IV SOLN
1.5000 g | Freq: Two times a day (BID) | INTRAVENOUS | Status: AC
  Administered 2014-10-30 – 2014-11-01 (×4): 1.5 g via INTRAVENOUS
  Filled 2014-10-30 (×4): qty 1.5

## 2014-10-30 MED ORDER — TIOTROPIUM BROMIDE MONOHYDRATE 18 MCG IN CAPS
18.0000 ug | ORAL_CAPSULE | Freq: Every day | RESPIRATORY_TRACT | Status: DC
  Administered 2014-10-30 – 2014-11-09 (×11): 18 ug via RESPIRATORY_TRACT
  Filled 2014-10-30 (×2): qty 5

## 2014-10-30 MED ORDER — ALBUMIN HUMAN 5 % IV SOLN
INTRAVENOUS | Status: AC
  Administered 2014-10-30: 12.5 g via INTRAVENOUS
  Filled 2014-10-30: qty 250

## 2014-10-30 MED ORDER — DEXTROSE-NACL 5-0.9 % IV SOLN
INTRAVENOUS | Status: DC
  Administered 2014-10-30 – 2014-11-01 (×2): via INTRAVENOUS

## 2014-10-30 MED ORDER — SODIUM CHLORIDE 0.9 % IJ SOLN
10.0000 mL | Freq: Two times a day (BID) | INTRAMUSCULAR | Status: DC
  Administered 2014-10-30 – 2014-10-31 (×4): 10 mL
  Administered 2014-11-01: 20 mL
  Administered 2014-11-01 – 2014-11-02 (×2): 10 mL
  Administered 2014-11-02: 20 mL
  Administered 2014-11-03 – 2014-11-04 (×4): 10 mL
  Administered 2014-11-05: 20 mL
  Administered 2014-11-05: 10 mL
  Administered 2014-11-06: 20 mL
  Administered 2014-11-06 – 2014-11-08 (×4): 10 mL

## 2014-10-30 MED ORDER — INSULIN ASPART 100 UNIT/ML ~~LOC~~ SOLN: 0.0000 [IU] | Freq: Every day | SUBCUTANEOUS | Status: DC

## 2014-10-30 MED ORDER — SODIUM CHLORIDE 0.9 % IJ SOLN
10.0000 mL | INTRAMUSCULAR | Status: DC | PRN
  Administered 2014-11-06: 10 mL
  Filled 2014-10-30: qty 40

## 2014-10-30 MED ORDER — ALBUMIN HUMAN 5 % IV SOLN
12.5000 g | Freq: Once | INTRAVENOUS | Status: AC
  Administered 2014-10-30: 12.5 g via INTRAVENOUS

## 2014-10-30 MED ORDER — INSULIN ASPART 100 UNIT/ML ~~LOC~~ SOLN
0.0000 [IU] | Freq: Three times a day (TID) | SUBCUTANEOUS | Status: DC
  Administered 2014-11-01 – 2014-11-04 (×5): 2 [IU] via SUBCUTANEOUS
  Administered 2014-11-05: 3 [IU] via SUBCUTANEOUS
  Administered 2014-11-05 – 2014-11-08 (×3): 2 [IU] via SUBCUTANEOUS

## 2014-10-30 MED ORDER — HYDRALAZINE HCL 20 MG/ML IJ SOLN
10.0000 mg | Freq: Four times a day (QID) | INTRAMUSCULAR | Status: DC | PRN
  Administered 2014-10-31: 10 mg via INTRAVENOUS
  Filled 2014-10-30: qty 1

## 2014-10-30 MED ORDER — ENOXAPARIN SODIUM 40 MG/0.4ML ~~LOC~~ SOLN
40.0000 mg | SUBCUTANEOUS | Status: DC
  Administered 2014-10-30 – 2014-11-09 (×11): 40 mg via SUBCUTANEOUS
  Filled 2014-10-30 (×11): qty 0.4

## 2014-10-30 NOTE — Progress Notes (Signed)
Peripherally Inserted Central Catheter/Midline Placement  The IV Nurse has discussed with the patient and/or persons authorized to consent for the patient, the purpose of this procedure and the potential benefits and risks involved with this procedure.  The benefits include less needle sticks, lab draws from the catheter and patient may be discharged home with the catheter.  Risks include, but not limited to, infection, bleeding, blood clot (thrombus formation), and puncture of an artery; nerve damage and irregular heat beat.  Alternatives to this procedure were also discussed.  PICC/Midline Placement Documentation        Henderson Baltimore 10/30/2014, 1:59 PM

## 2014-10-30 NOTE — Progress Notes (Addendum)
TCTS DAILY ICU PROGRESS NOTE                   [address]            [place] [address]          [phone]   1 Day Post-Op Procedure(s) (LRB): VIDEO ASSISTED THORACOSCOPY (Left) STAPLING OF BLEBS (Left) PLEURADESIS (Left)  Total Length of Stay:  LOS: 2 days   Subjective: Patient is awake, alert, sitting in chair. He is hungry this am.   Objective: Vital signs in last 24 hours: Temp:  [97.8 F (36.6 C)-98.7 F (37.1 C)] 98.7 F (37.1 C) (02/19 0355) Pulse Rate:  [82-102] 95 (02/19 0700) Cardiac Rhythm:  [-] Normal sinus rhythm (02/19 0600) Resp:  [13-26] 23 (02/19 0740) BP: (97-160)/(41-75) 134/60 mmHg (02/19 0700) SpO2:  [89 %-98 %] 97 % (02/19 0740) Arterial Line BP: (111-194)/(46-73) 143/60 mmHg (02/19 0700)  Filed Weights   10/28/14 1100  Weight: 146 lb 8 oz (66.452 kg)      Intake/Output from previous day: 02/18 0701 - 02/19 0700 In: 1658.3 [I.V.:1608.3; IV Piggyback:50] Out: 1335 [Urine:945; Chest Tube:390]     Current Meds: Scheduled Meds: . acetaminophen  1,000 mg Oral 4 times per day   Or  . acetaminophen (TYLENOL) oral liquid 160 mg/5 mL  1,000 mg Oral 4 times per day  . antiseptic oral rinse  7 mL Mouth Rinse q12n4p  . bisacodyl  10 mg Oral Daily  . budesonide-formoterol  2 puff Inhalation BID  . chlorhexidine  15 mL Mouth Rinse BID  . fentaNYL   Intravenous 6 times per day  . senna-docusate  1 tablet Oral QHS   Continuous Infusions: . dextrose 5 % and 0.9% NaCl 100 mL/hr at 10/30/14 0300   PRN Meds:.diphenhydrAMINE **OR** diphenhydrAMINE, fluticasone, levalbuterol, naloxone **AND** sodium chloride, ondansetron (ZOFRAN) IV, potassium chloride  General appearance: alert, cooperative and no distress Neurologic: intact Heart: RRR Lungs: Coarse on the left and clear on the right. Subcutaneous emphysema bilaterally. Markedly decreased in face and left eye Abdomen: soft, non-tender; bowel sounds normal; no masses,  no  organomegaly Extremities: Ted hose in place. No LE edema Wound: Dressing is clean and dry  Lab Results: CBC: Recent Labs  10/29/14 0350 10/30/14 0415  WBC 13.0* 15.6*  HGB 15.3 12.6*  HCT 44.6 38.3*  PLT 322 267   BMET:  Recent Labs  10/29/14 0350 10/30/14 0415  NA 135 137  K 4.4 4.1  CL 102 107  CO2 25 25  GLUCOSE 200* 179*  BUN 26* 32*  CREATININE 1.05 0.98  CALCIUM 8.0* 7.5*    PT/INR:  Recent Labs  10/29/14 0350  LABPROT 14.6  INR 1.12   Radiology: Dg Chest 2 View  10/29/2014   CLINICAL DATA:  Dyspnea.  Chest tube.  Chest pain.  EXAM: CHEST  2 VIEW  COMPARISON:  Radiograph from earlier today.  FINDINGS: The left pneumothorax has enlarged, now approximately 10-20% volume. Subcutaneous emphysema appears unchanged. Chest tube appears unchanged. Pneumomediastinum again noted.  No other significant interval changes are evident. Minor left base atelectatic appearing opacities are present.  IMPRESSION: Enlargement of the left pneumothorax.   Electronically Signed   By: Andreas Newport M.D.   On: 10/29/2014 00:59   Dg Chest Port 1 View  10/29/2014   CLINICAL DATA:  69 year old male status post VATS. Lung blebs. Pneumothorax. Initial encounter.  EXAM: PORTABLE CHEST - 1 VIEW  COMPARISON:  0054 hours  the same day and earlier.  FINDINGS: Portable AP semi upright view at 1236 hours. A second left chest tube now is in place. Left pneumothorax no longer are identified.  Severe chest wall and neck subcutaneous emphysema re - identified.  Stable lung volumes. Stable cardiac size and mediastinal contours. No pulmonary edema, pleural effusion or consolidation identified.  IMPRESSION: 1. Second left chest tube placed, left pneumothorax no longer visible. 2. No new cardiopulmonary abnormality identified. 3. Severe subcutaneous emphysema.   Electronically Signed   By: Genevie Ann M.D.   On: 10/29/2014 14:46     Assessment/Plan: S/P Procedure(s) (LRB): VIDEO ASSISTED THORACOSCOPY  (Left) STAPLING OF BLEBS (Left) PLEURADESIS (Left)  1. CV-SR in the 80's. Mildly hypertensive. On Lisinopril pre op. Will not restart this yet. Will give Hydralazine PRN.  2. Pulmonary-On 4 liters of oxygen via [place].Chest tubes with 390 cc since surgery. Chest tubes are to suction. There is a +1 air leak. Chest tubes to remain for now. CXR shows patient is rotated to the right, extensive subcutaneous emphysema, no pneumothorax. Encourage incentive spirometer and flutter valve.  3. ABL anemia- H and H 12.6 and 38.3 4. Remove a line 5. Decrease IVF once tolerates diet   ZIMMERMAN,DONIELLE M PA-C 10/30/2014 8:23 AM   Postop pain well controlled Air leak much better than preop ABG this am good- DC aline and start ambulation  Reduce CT suction to 10 cm H2O since no PNTX on am CXR Start SSI for hyperglycemia  patient examined and medical record reviewed,agree with above note. VAN TRIGT III,Leeandra Ellerson 10/30/2014

## 2014-10-30 NOTE — Progress Notes (Signed)
Patient ID: Alan Huff, male   DOB: 1946-01-06, 69 y.o.   MRN: [id number] EVENING ROUNDS NOTE :     [address]       [place] [address]             [phone]                 1 Day Post-Op Procedure(s) (LRB): VIDEO ASSISTED THORACOSCOPY (Left) STAPLING OF BLEBS (Left) PLEURADESIS (Left)  Total Length of Stay:  LOS: 2 days  BP 115/58 mmHg  Pulse 102  Temp(Src) 98 F (36.7 C) (Oral)  Resp 21  Ht 5\' 7"  (1.702 m)  Wt 146 lb 8 oz (66.452 kg)  BMI 22.94 kg/m2  SpO2 94%  .Intake/Output      02/18 0701 - 02/19 0700 02/19 0701 - 02/20 0700   P.O.  240   I.V. (mL/kg) 1608.3 (24.2) 297.5 (4.5)   IV Piggyback 50 50   Total Intake(mL/kg) 1658.3 (25) 587.5 (8.8)   Urine (mL/kg/hr) 970 (0.6) 314 (0.5)   Chest Tube 390 (0.2) 20 (0)   Total Output 1360 334   Net +298.3 +253.5          . dextrose 5 % and 0.9% NaCl 10 mL/hr (10/30/14 1045)     Lab Results  Component Value Date   WBC 15.6* 10/30/2014   HGB 12.6* 10/30/2014   HCT 38.3* 10/30/2014   PLT 267 10/30/2014   GLUCOSE 179* 10/30/2014   ALT 23 10/29/2014   AST 28 10/29/2014   NA 137 10/30/2014   K 4.1 10/30/2014   CL 107 10/30/2014   CREATININE 0.98 10/30/2014   BUN 32* 10/30/2014   CO2 25 10/30/2014   INR 1.12 10/29/2014   HGBA1C 6.5* 10/28/2014   subQ air improving Stable day Ct tube to suction  Grace Isaac MD  Beeper [phone] Office [phone] 10/30/2014 5:12 PM

## 2014-10-30 NOTE — Op Note (Signed)
NAMEMarland Kitchen  Alan Huff, Alan Huff NO.:  [id number]  MEDICAL RECORD NO.:  [id number]  LOCATION:  2S03C                        FACILITY:  [facility]  PHYSICIAN:  Ivin Poot, M.D.  DATE OF BIRTH:  [birth year]  DATE OF PROCEDURE:  10/29/2014 DATE OF DISCHARGE:                              OPERATIVE REPORT   OPERATIONS: 1. Left VATS (video-assisted thoracoscopic surgery) with resection of     apical and lingular blebs, mechanical pleurodesis. 2. [place]c pleurodesis of the left pleural space. 3. 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  SURGEON:  Ivin Poot, M.D.  ASSISTANT:  John Giovanni, P.A.-C.  PREOPERATIVE DIAGNOSES:  Chronic obstructive pulmonary disease, spontaneous pneumothorax with persistent large air leak from chest tube associated with significant subcutaneous emphysema.  POSTOPERATIVE DIAGNOSES:  Chronic obstructive pulmonary disease, spontaneous pneumothorax with persistent large air leak from chest tube associated with significant subcutaneous emphysema.  ANESTHESIA:  General.  INDICATIONS:  The patient is A 69 year old with history of smoking and COPD.  He was admitted to an outside hospital with shortness of breath and a spontaneous pneumothorax.  A chest tube was placed and then repositioned on several occasions.  He had persistent large air leak with persistent pneumothorax and developed significant subcutaneous emphysema, was transferred to this hospital for thoracic surgical evaluation and treatment.  Following his arrival, I examined the patient and reviewed a CT scan of the chest, which was performed here.  There was no evidence of tumor, but he did have bullous changes at both apices as well as some blebs in the lingula.  Left VATS for resection of blebs and pleurodesis was recommended.  I discussed the procedure in detail with the patient including the indications, benefits, alternatives,  and risks.  We discussed the possible risks including persistent air leak, ventilator dependence, infection, and death.  He demonstrated his understanding and agreed to proceed with surgery under what I felt was an informed consent.  OPERATIVE FINDINGS: 1. Severe emphysema with thin fragile lungs. 2. Erythematous, inflamed lungs. 3. 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  OPERATIVE PROCEDURE:  The patient was brought to the operating room and placed supine on the operating table where general anesthesia was induced and a double-lumen endotracheal tube was placed.  The patient was turned to exposing the left chest, which was prepped and draped as a sterile field.  A proper time-out was performed.  The previous chest tube had been removed prior to the prep.  Three VATS incisions were made around the circumference of the left chest.  Beneath the tip of the scapula, a VATS camera was inserted.  The lung was examined.  It was extremely erythematous with thin, fragile, erythematous tissue.  There were some apical blebs noted.  There were some adhesions at the apex noted.  Using the VATS portal, the VATS instruments and the camera, the apical adhesions were taken down to expose the apex.  Using the  VATS endoscopic stapling devices, wedge resections were performed along the apex to remove some blebs as well as along the lingula.  The staple lines were coated with a thin layer of medical adhesive - Coseal.  A 36-French chest tube was then positioned laterally in the pleural space and brought out through separate incision.  The lung was then expanded under direct vision.  The VATS incisions were closed.  The chest tube was connected to underwater seal Pleur-Evac drainage system.  There was a severe air leak to the chest tube, which  did not improve with time.  The chest tube was disconnected from the suction.  The left lung was deflated.  A small incision was made in the fourth interspace anterior to the scapula and the ribs were gently spread.  The lung was directly inspected.  The staple lines appeared to be intact.  One staple line was reinforced with a second staple line using the Endo-GIA stapling device.  Water was placed in the pleural space, warm to body temperature.  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  This was then oversewn with figure-of-eight chromic 4-0 suture and also coded with a layer of Coseal medical adhesive.  A second 28-French chest tube was positioned anteriorly and brought out through separate incision. The lung was deflated.  The thoracotomy incision was closed with #2 Vicryl pericostals.  The lung was then reinflated under direct vision as the pericostals were tied.  The muscle layers were closed with interrupted Vicryl.  The subcutaneous and skin layers were closed in running Vicryl.  Chest tubes were connected to underwater seal Pleur-Evac drainage system.  The air leak was significantly improved.  The patient was then rolled supine, extubated and returned to the recovery room.     Ivin Poot, M.D.     PV/MEDQ  D:  10/29/2014  T:  10/30/2014  Job:  [id number]  cc:   Dorene Grebe. Ronne Binning, M.D.

## 2014-10-31 ENCOUNTER — Encounter (HOSPITAL_COMMUNITY): Payer: Self-pay | Admitting: Cardiothoracic Surgery

## 2014-10-31 ENCOUNTER — Inpatient Hospital Stay (HOSPITAL_COMMUNITY): Payer: Medicare Other

## 2014-10-31 LAB — CBC
HCT: 32.7 % — ABNORMAL LOW (ref 39.0–52.0)
Hemoglobin: 10.8 g/dL — ABNORMAL LOW (ref 13.0–17.0)
MCH: 29 pg (ref 26.0–34.0)
MCHC: 33 g/dL (ref 30.0–36.0)
MCV: 87.9 fL (ref 78.0–100.0)
Platelets: 238 10*3/uL (ref 150–400)
RBC: 3.72 MIL/uL — ABNORMAL LOW (ref 4.22–5.81)
RDW: 15 % (ref 11.5–15.5)
WBC: 11.6 10*3/uL — ABNORMAL HIGH (ref 4.0–10.5)

## 2014-10-31 LAB — GLUCOSE, CAPILLARY
Glucose-Capillary: 133 mg/dL — ABNORMAL HIGH (ref 70–99)
Glucose-Capillary: 97 mg/dL (ref 70–99)
Glucose-Capillary: 101 mg/dL — ABNORMAL HIGH (ref 70–99)
Glucose-Capillary: 112 mg/dL — ABNORMAL HIGH (ref 70–99)
Glucose-Capillary: 108 mg/dL — ABNORMAL HIGH (ref 70–99)

## 2014-10-31 LAB — COMPREHENSIVE METABOLIC PANEL
ALK PHOS: 28 U/L — AB (ref 39–117)
ALT: 17 U/L (ref 0–53)
ANION GAP: 3 — AB (ref 5–15)
AST: 27 U/L (ref 0–37)
Albumin: 2.4 g/dL — ABNORMAL LOW (ref 3.5–5.2)
BILIRUBIN TOTAL: 0.8 mg/dL (ref 0.3–1.2)
BUN: 26 mg/dL — ABNORMAL HIGH (ref 6–23)
CO2: 29 mmol/L (ref 19–32)
Calcium: 7.4 mg/dL — ABNORMAL LOW (ref 8.4–10.5)
Chloride: 107 mmol/L (ref 96–112)
Creatinine, Ser: 0.87 mg/dL (ref 0.50–1.35)
GFR calc Af Amer: >90 mL/min (ref >90)
GFR calc non Af Amer: 85 mL/min — ABNORMAL LOW (ref >90)
Glucose, Bld: 107 mg/dL — ABNORMAL HIGH (ref 70–99)
Potassium: 3.6 mmol/L (ref 3.5–5.1)
SODIUM: 139 mmol/L (ref 135–145)
Total Protein: 4.5 g/dL — ABNORMAL LOW (ref 6.0–8.3)

## 2014-10-31 MED ORDER — GUAIFENESIN ER 600 MG PO TB12
600.0000 mg | ORAL_TABLET | Freq: Two times a day (BID) | ORAL | Status: DC
  Administered 2014-10-31 – 2014-11-09 (×19): 600 mg via ORAL
  Filled 2014-10-31 (×21): qty 1

## 2014-10-31 MED ORDER — POTASSIUM CHLORIDE 10 MEQ/50ML IV SOLN
10.0000 meq | INTRAVENOUS | Status: AC
  Administered 2014-10-31 (×3): 10 meq via INTRAVENOUS

## 2014-10-31 MED ORDER — POTASSIUM CHLORIDE 10 MEQ/50ML IV SOLN: 10.0000 meq | INTRAVENOUS | Status: DC | PRN

## 2014-10-31 NOTE — Progress Notes (Signed)
Pt's urine output for last 2 hours was 10 cc/hr. MD notified. Order given for 250cc Albumin and increase IVF to 50cc/hr. Will implement orders and continue to monitor.  Vella Raring, RN

## 2014-10-31 NOTE — Progress Notes (Addendum)
TCTS DAILY ICU PROGRESS NOTE                   [address]            [place] [address]          [phone]   2 Days Post-Op Procedure(s) (LRB): VIDEO ASSISTED THORACOSCOPY (Left) STAPLING OF BLEBS (Left) PLEURADESIS (Left)  Total Length of Stay:  LOS: 3 days   Subjective: Feels better today, SQ air decreasing.  C/o thick secretions that are hard to cough up.   Objective: Vital signs in last 24 hours: Temp:  [97.9 F (36.6 C)-98.6 F (37 C)] 98.6 F (37 C) (02/20 0738) Pulse Rate:  [74-115] 89 (02/20 0900) Cardiac Rhythm:  [-] Normal sinus rhythm (02/20 0745) Resp:  [13-27] 17 (02/20 0900) BP: (115-170)/(48-89) 125/56 mmHg (02/20 0900) SpO2:  [92 %-97 %] 93 % (02/20 0900) Weight:  [145 lb 4.5 oz (65.9 kg)] 145 lb 4.5 oz (65.9 kg) (02/20 0600)  Filed Weights   10/28/14 1100 10/31/14 0600  Weight: 146 lb 8 oz (66.452 kg) 145 lb 4.5 oz (65.9 kg)    Weight change:    Hemodynamic parameters for last 24 hours:    Intake/Output from previous day: 02/19 0701 - 02/20 0700 In: 1356.2 [P.O.:240; I.V.:766.2; IV Piggyback:350] Out: 5427 [Urine:1204; Chest Tube:160]  Intake/Output this shift: Total I/O In: 390 [P.O.:240; IV Piggyback:150] Out: 125 [Urine:125]  Current Meds: Scheduled Meds: . acetaminophen  1,000 mg Oral 4 times per day   Or  . acetaminophen (TYLENOL) oral liquid 160 mg/5 mL  1,000 mg Oral 4 times per day  . antiseptic oral rinse  7 mL Mouth Rinse q12n4p  . bisacodyl  10 mg Oral Daily  . budesonide-formoterol  2 puff Inhalation BID  . cefUROXime (ZINACEF)  IV  1.5 g Intravenous Q12H  . enoxaparin (LOVENOX) injection  40 mg Subcutaneous Q24H  . fentaNYL   Intravenous 6 times per day  . insulin aspart  0-15 Units Subcutaneous TID WC  . insulin aspart  0-5 Units Subcutaneous QHS  . potassium chloride  10 mEq Intravenous Q1 Hr x 3  . senna-docusate  1 tablet Oral QHS  . sodium chloride  10-40 mL Intracatheter Q12H  . tiotropium  18  mcg Inhalation Daily   Continuous Infusions: . dextrose 5 % and 0.9% NaCl 50 mL/hr at 10/30/14 2132   PRN Meds:.diphenhydrAMINE **OR** diphenhydrAMINE, fluticasone, hydrALAZINE, levalbuterol, naloxone **AND** sodium chloride, ondansetron (ZOFRAN) IV, potassium chloride, potassium chloride, sodium chloride   Physical Exam: General appearance: alert, cooperative and no distress Heart: regular rate and rhythm, S1, S2 normal, no murmur, click, rub or gallop Lungs: clear to auscultation bilaterally Wound: Clean and dry, SubQ air throughout left chest/neck/back  Chest tube: 1/7 air leak   Lab Results: CBC: Recent Labs  10/30/14 0415 10/31/14 0430  WBC 15.6* 11.6*  HGB 12.6* 10.8*  HCT 38.3* 32.7*  PLT 267 238   BMET:  Recent Labs  10/30/14 0415 10/31/14 0430  NA 137 139  K 4.1 3.6  CL 107 107  CO2 25 29  GLUCOSE 179* 107*  BUN 32* 26*  CREATININE 0.98 0.87  CALCIUM 7.5* 7.4*    PT/INR:  Recent Labs  10/29/14 0350  LABPROT 14.6  INR 1.12   Radiology: Dg Chest Port 1 View  10/30/2014   CLINICAL DATA:  Status post pleurodesis with resection of blebs on the left, persistent air leak from chest tube  EXAM:  PORTABLE CHEST - 1 VIEW  COMPARISON:  Portable chest x-ray of October 29, 2014  FINDINGS: The lungs are well-expanded. Extensive subcutaneous air remains present bilaterally. No pneumothorax is evident. Two left-sided chest tubes are unchanged in position. There is no alveolar infiltrate. The heart and pulmonary vascularity are normal. The observed bony thorax is unremarkable.  IMPRESSION: Slight interval decrease in extensive subcutaneous emphysema. No pneumothorax is visible. The left-sided chest tubes are unchanged in position.   Electronically Signed   By: David  Martinique   On: 10/30/2014 07:18     Assessment/Plan: S/P Procedure(s) (LRB): VIDEO ASSISTED THORACOSCOPY (Left) STAPLING OF BLEBS (Left) PLEURADESIS (Left) SQ emphysema improving, CT still with small air  leak. Continue to low suction for now. Continue pulm toilet, will add Mucinex for thick secretions. Decrease IV to [facility], ambulate in hall.  COLLINS,GINA H 10/31/2014 10:12 AM  Small air leak, sub q air improving I have seen and examined Milus Glazier and agree with the above assessment  and plan.  Grace Isaac MD Beeper [phone] Office [phone] 10/31/2014 12:15 PM

## 2014-10-31 NOTE — Progress Notes (Signed)
Patient ID: Alan Huff, male   DOB: Feb 03, 1946, 69 y.o.   MRN: [id number] EVENING ROUNDS NOTE :     [address]       [place] [address]             [phone]                 2 Days Post-Op Procedure(s) (LRB): VIDEO ASSISTED THORACOSCOPY (Left) STAPLING OF BLEBS (Left) PLEURADESIS (Left)  Total Length of Stay:  LOS: 3 days  BP 133/52 mmHg  Pulse 91  Temp(Src) 98.6 F (37 C) (Oral)  Resp 25  Ht 5\' 7"  (1.702 m)  Wt 145 lb 4.5 oz (65.9 kg)  BMI 22.75 kg/m2  SpO2 95%  .Intake/Output      02/20 0701 - 02/21 0700   P.O. 480   I.V. (mL/kg) 86.5 (1.3)   IV Piggyback 200   Total Intake(mL/kg) 766.5 (11.6)   Urine (mL/kg/hr) 605 (0.7)   Chest Tube 50 (0.1)   Total Output 655   Net +111.5         . dextrose 5 % and 0.9% NaCl 10 mL/hr at 10/31/14 1021     Lab Results  Component Value Date   WBC 11.6* 10/31/2014   HGB 10.8* 10/31/2014   HCT 32.7* 10/31/2014   PLT 238 10/31/2014   GLUCOSE 107* 10/31/2014   ALT 17 10/31/2014   AST 27 10/31/2014   NA 139 10/31/2014   K 3.6 10/31/2014   CL 107 10/31/2014   CREATININE 0.87 10/31/2014   BUN 26* 10/31/2014   CO2 29 10/31/2014   INR 1.12 10/29/2014   HGBA1C 6.5* 10/28/2014   subq air decreasing, still small air leak , move to water seal  Grace Isaac MD  Beeper [phone] Office [phone] 10/31/2014 7:22 PM

## 2014-11-01 ENCOUNTER — Inpatient Hospital Stay (HOSPITAL_COMMUNITY): Payer: Medicare Other

## 2014-11-01 LAB — CBC
HCT: 32.8 % — ABNORMAL LOW (ref 39.0–52.0)
Hemoglobin: 10.7 g/dL — ABNORMAL LOW (ref 13.0–17.0)
MCH: 28.8 pg (ref 26.0–34.0)
MCHC: 32.6 g/dL (ref 30.0–36.0)
MCV: 88.2 fL (ref 78.0–100.0)
Platelets: 244 10*3/uL (ref 150–400)
RBC: 3.72 MIL/uL — ABNORMAL LOW (ref 4.22–5.81)
RDW: 15 % (ref 11.5–15.5)
WBC: 8.9 10*3/uL (ref 4.0–10.5)

## 2014-11-01 LAB — GLUCOSE, CAPILLARY
Glucose-Capillary: 96 mg/dL (ref 70–99)
Glucose-Capillary: 117 mg/dL — ABNORMAL HIGH (ref 70–99)
Glucose-Capillary: 138 mg/dL — ABNORMAL HIGH (ref 70–99)
Glucose-Capillary: 119 mg/dL — ABNORMAL HIGH (ref 70–99)

## 2014-11-01 MED ORDER — IPRATROPIUM-ALBUTEROL 0.5-2.5 (3) MG/3ML IN SOLN
3.0000 mL | Freq: Four times a day (QID) | RESPIRATORY_TRACT | Status: DC | PRN
  Administered 2014-11-06: 3 mL via RESPIRATORY_TRACT
  Filled 2014-11-01: qty 3

## 2014-11-01 MED ORDER — BUDESONIDE-FORMOTEROL FUMARATE 160-4.5 MCG/ACT IN AERO: 2.0000 | INHALATION_SPRAY | Freq: Two times a day (BID) | RESPIRATORY_TRACT | Status: DC

## 2014-11-01 MED ORDER — OXYCODONE HCL 5 MG PO TABS
10.0000 mg | ORAL_TABLET | ORAL | Status: DC | PRN
  Administered 2014-11-01: 10 mg via ORAL
  Administered 2014-11-03: 5 mg via ORAL
  Filled 2014-11-01 (×2): qty 2

## 2014-11-01 MED ORDER — TIOTROPIUM BROMIDE MONOHYDRATE 18 MCG IN CAPS: 18.0000 ug | ORAL_CAPSULE | Freq: Every day | RESPIRATORY_TRACT | Status: DC

## 2014-11-01 MED ORDER — TRAMADOL HCL 50 MG PO TABS
50.0000 mg | ORAL_TABLET | Freq: Four times a day (QID) | ORAL | Status: DC | PRN
  Administered 2014-11-04: 50 mg via ORAL
  Filled 2014-11-01 (×2): qty 1

## 2014-11-01 MED ORDER — LISINOPRIL 10 MG PO TABS
10.0000 mg | ORAL_TABLET | Freq: Every day | ORAL | Status: DC
  Administered 2014-11-01: 10 mg via ORAL
  Filled 2014-11-01 (×2): qty 1

## 2014-11-01 MED ORDER — ALBUTEROL SULFATE (2.5 MG/3ML) 0.083% IN NEBU
3.0000 mL | INHALATION_SOLUTION | Freq: Four times a day (QID) | RESPIRATORY_TRACT | Status: DC | PRN
  Administered 2014-11-06 (×2): 3 mL via RESPIRATORY_TRACT
  Filled 2014-11-01 (×2): qty 3

## 2014-11-01 NOTE — Progress Notes (Signed)
Patient ID: Alan Huff, male   DOB: 05-16-1946, 69 y.o.   MRN: [id number] TCTS DAILY ICU PROGRESS NOTE                   Alan [address]            [place] [address]          [phone]   3 Days Post-Op Procedure(s) (LRB): VIDEO ASSISTED THORACOSCOPY (Left) STAPLING OF BLEBS (Left) PLEURADESIS (Left)  Total Length of Stay:  LOS: 4 days   Subjective: comfortable breathing  Objective: Vital signs in last 24 hours: Temp:  [98.3 F (36.8 C)-98.9 F (37.2 C)] 98.5 F (36.9 C) (02/21 0730) Pulse Rate:  [70-100] 85 (02/21 0800) Cardiac Rhythm:  [-] Normal sinus rhythm (02/21 0800) Resp:  [12-29] 14 (02/21 0800) BP: (105-151)/(40-70) 137/70 mmHg (02/21 0800) SpO2:  [93 %-98 %] 98 % (02/21 0800) Weight:  [143 lb 4.8 oz (65 kg)] 143 lb 4.8 oz (65 kg) (02/21 0600)  Filed Weights   10/28/14 1100 10/31/14 0600 11/01/14 0600  Weight: 146 lb 8 oz (66.452 kg) 145 lb 4.5 oz (65.9 kg) 143 lb 4.8 oz (65 kg)    Weight change: -1 lb 15.8 oz (-0.9 kg)   Hemodynamic parameters for last 24 hours:    Intake/Output from previous day: 02/20 0701 - 02/21 0700 In: 1920.5 [P.O.:1440; I.V.:230.5; IV Piggyback:250] Out: 1900 [Urine:1800; Chest Tube:100]  Intake/Output this shift: Total I/O In: 60 [I.V.:10; IV Piggyback:50] Out: 0   Current Meds: Scheduled Meds: . acetaminophen  1,000 mg Oral 4 times per day   Or  . acetaminophen (TYLENOL) oral liquid 160 mg/5 mL  1,000 mg Oral 4 times per day  . antiseptic oral rinse  7 mL Mouth Rinse q12n4p  . bisacodyl  10 mg Oral Daily  . budesonide-formoterol  2 puff Inhalation BID  . enoxaparin (LOVENOX) injection  40 mg Subcutaneous Q24H  . fentaNYL   Intravenous 6 times per day  . guaiFENesin  600 mg Oral BID  . insulin aspart  0-15 Units Subcutaneous TID WC  . insulin aspart  0-5 Units Subcutaneous QHS  . senna-docusate  1 tablet Oral QHS  . sodium chloride  10-40 mL Intracatheter Q12H  . tiotropium  18 mcg Inhalation Daily    Continuous Infusions: . dextrose 5 % and 0.9% NaCl 10 mL/hr at 10/31/14 1021   PRN Meds:.diphenhydrAMINE **OR** diphenhydrAMINE, fluticasone, hydrALAZINE, levalbuterol, naloxone **AND** sodium chloride, ondansetron (ZOFRAN) IV, potassium chloride, sodium chloride  General appearance: alert, cooperative, no distress and decreasing subq air Neurologic: intact Heart: regular rate and rhythm, S1, S2 normal, no murmur, click, rub or gallop Lungs: diminished breath sounds bibasilar Abdomen: soft, non-tender; bowel sounds normal; no masses,  no organomegaly Extremities: extremities normal, atraumatic, no cyanosis or edema and Homans sign is negative, no sign of DVT Wound: small air leak on water seal  Lab Results: CBC: Recent Labs  10/31/14 0430 11/01/14 0345  WBC 11.6* 8.9  HGB 10.8* 10.7*  HCT 32.7* 32.8*  PLT 238 244   BMET:  Recent Labs  10/30/14 0415 10/31/14 0430  NA 137 139  K 4.1 3.6  CL 107 107  CO2 25 29  GLUCOSE 179* 107*  BUN 32* 26*  CREATININE 0.98 0.87  CALCIUM 7.5* 7.4*    PT/INR: No results for input(s): LABPROT, INR in the last 72 hours. Radiology: Dg Chest Port 1 View  10/31/2014   CLINICAL DATA:  Chest tube placement  EXAM: PORTABLE CHEST - 1 VIEW  COMPARISON:  10/30/2014  FINDINGS: There are 2 left chest tubes, appearing unchanged in position. There is a left subclavian PICC line with tip in the SVC. Extensive subcutaneous emphysema persists. No pneumothorax is evident. No consolidated airspace opacities are evident, but there is slight hazy atelectatic appearing basilar opacity bilaterally. No large effusions are evident.  IMPRESSION: No pneumothorax is evident. Minor atelectatic basilar opacities without significant interval change.   Electronically Signed   By: Andreas Newport M.D.   On: 10/31/2014 05:18     Assessment/Plan: S/P Procedure(s) (LRB): VIDEO ASSISTED THORACOSCOPY (Left) STAPLING OF BLEBS (Left) PLEURADESIS (Left) Mobilize leave  chest tubes until no air leak To step down D/c pca pump  Foley out today    Alan Huff B 11/01/2014 8:28 AM

## 2014-11-01 NOTE — Progress Notes (Signed)
Pt received from [place], pt up in chair, pt VSS, pt oriented to room, all questions answered

## 2014-11-01 NOTE — Progress Notes (Addendum)
Wasted 21ml of Fentanyl PCA syringe down the sink with Sula Soda, RN.   Maia Petties, RN   Witnessed waste of 10 ml of Fentanyl PCA by Maia Petties, RN  Herschell Dimes, Sherley Bounds

## 2014-11-02 ENCOUNTER — Inpatient Hospital Stay (HOSPITAL_COMMUNITY): Payer: Medicare Other

## 2014-11-02 LAB — CBC
HCT: 32.2 % — ABNORMAL LOW (ref 39.0–52.0)
Hemoglobin: 10.6 g/dL — ABNORMAL LOW (ref 13.0–17.0)
MCH: 28.7 pg (ref 26.0–34.0)
MCHC: 32.9 g/dL (ref 30.0–36.0)
MCV: 87.3 fL (ref 78.0–100.0)
Platelets: 276 K/uL (ref 150–400)
RBC: 3.69 MIL/uL — ABNORMAL LOW (ref 4.22–5.81)
RDW: 15 % (ref 11.5–15.5)
WBC: 9.9 K/uL (ref 4.0–10.5)

## 2014-11-02 LAB — GLUCOSE, CAPILLARY
Glucose-Capillary: 113 mg/dL — ABNORMAL HIGH (ref 70–99)
Glucose-Capillary: 123 mg/dL — ABNORMAL HIGH (ref 70–99)
Glucose-Capillary: 120 mg/dL — ABNORMAL HIGH (ref 70–99)
Glucose-Capillary: 107 mg/dL — ABNORMAL HIGH (ref 70–99)

## 2014-11-02 MED ORDER — LACTULOSE 10 GM/15ML PO SOLN
30.0000 g | Freq: Every day | ORAL | Status: DC | PRN
  Filled 2014-11-02 (×2): qty 45

## 2014-11-02 MED ORDER — DEXTROSE 5 % IV SOLN
1.5000 g | Freq: Two times a day (BID) | INTRAVENOUS | Status: AC
  Administered 2014-11-02 – 2014-11-04 (×5): 1.5 g via INTRAVENOUS
  Filled 2014-11-02 (×5): qty 1.5

## 2014-11-02 MED ORDER — LISINOPRIL 20 MG PO TABS
20.0000 mg | ORAL_TABLET | Freq: Every day | ORAL | Status: DC
  Administered 2014-11-02 – 2014-11-09 (×8): 20 mg via ORAL
  Filled 2014-11-02 (×8): qty 1

## 2014-11-02 NOTE — Progress Notes (Addendum)
      [address]       [place] [address]             [phone]      4 Days Post-Op Procedure(s) (LRB): VIDEO ASSISTED THORACOSCOPY (Left) STAPLING OF BLEBS (Left) PLEURADESIS (Left)   Subjective:  Alan Huff has no complaints this morning.  States he is doing okay this morning.  + ambulation +BM  Objective: Vital signs in last 24 hours: Temp:  [98 F (36.7 C)-98.7 F (37.1 C)] 98.5 F (36.9 C) (02/22 0700) Pulse Rate:  [78-97] 78 (02/22 0743) Cardiac Rhythm:  [-] Normal sinus rhythm (02/22 0745) Resp:  [14-27] 27 (02/22 0635) BP: (97-174)/(43-127) 153/67 mmHg (02/22 0743) SpO2:  [87 %-97 %] 93 % (02/22 0743)  Intake/Output from previous day: 02/21 0701 - 02/22 0700 In: 914 [P.O.:600; I.V.:220; IV Piggyback:50] Out: 1430 [Urine:1400; Chest Tube:30] Intake/Output this shift: Total I/O In: -  Out: 30 [Chest Tube:30]  General appearance: alert, cooperative and no distress Heart: regular rate and rhythm Lungs: clear to auscultation bilaterally Abdomen: soft, non-tender; bowel sounds normal; no masses,  no organomegaly Extremities: extremities normal, atraumatic, no cyanosis or edema Wound: clean and dry  Lab Results:  Recent Labs  11/01/14 0345 11/02/14 0331  WBC 8.9 9.9  HGB 10.7* 10.6*  HCT 32.8* 32.2*  PLT 244 276   BMET:  Recent Labs  10/31/14 0430  NA 139  K 3.6  CL 107  CO2 29  GLUCOSE 107*  BUN 26*  CREATININE 0.87  CALCIUM 7.4*    PT/INR: No results for input(s): LABPROT, INR in the last 72 hours. ABG    Component Value Date/Time   PHART 7.429 10/30/2014 0500   HCO3 24.5* 10/30/2014 0500   TCO2 25.7 10/30/2014 0500   O2SAT 96.4 10/30/2014 0500   CBG (last 3)   Recent Labs  11/01/14 1201 11/01/14 1654 11/01/14 2131  GLUCAP 117* 138* 119*    Assessment/Plan: S/P Procedure(s) (LRB): VIDEO ASSISTED THORACOSCOPY (Left) STAPLING OF BLEBS (Left) PLEURADESIS (Left)  1. Chest tube- minimal output, +1 air leak  with cough- CXR remains stable will d/c posterior chest tube 2. CV- HTN- continue Hydralazine prn, will increase Lisinopril to 20 mg daily 3. Pulm- wean oxygen as tolerated, on home inhalers, continue nebs, IS 4. Dispo- patient stable, d/c posterior chest tube today   LOS: 5 days    Ellwood Handler 11/02/2014 Path on wedge resections- no evidence of malignancy patient examined and medical record reviewed,agree with above note. Cont water seal anterior chest tube VAN TRIGT III,PETER 11/02/2014

## 2014-11-03 ENCOUNTER — Inpatient Hospital Stay (HOSPITAL_COMMUNITY): Payer: Medicare Other

## 2014-11-03 LAB — BASIC METABOLIC PANEL
Anion gap: 4 — ABNORMAL LOW (ref 5–15)
BUN: 19 mg/dL (ref 6–23)
CO2: 30 mmol/L (ref 19–32)
Calcium: 7.9 mg/dL — ABNORMAL LOW (ref 8.4–10.5)
Chloride: 102 mmol/L (ref 96–112)
Creatinine, Ser: 0.88 mg/dL (ref 0.50–1.35)
GFR calc Af Amer: >90 mL/min (ref >90)
GFR calc non Af Amer: 85 mL/min — ABNORMAL LOW (ref >90)
Glucose, Bld: 107 mg/dL — ABNORMAL HIGH (ref 70–99)
Potassium: 3.8 mmol/L (ref 3.5–5.1)
Sodium: 136 mmol/L (ref 135–145)

## 2014-11-03 LAB — CBC
HCT: 35.3 % — ABNORMAL LOW (ref 39.0–52.0)
Hemoglobin: 11.5 g/dL — ABNORMAL LOW (ref 13.0–17.0)
MCH: 29 pg (ref 26.0–34.0)
MCHC: 32.6 g/dL (ref 30.0–36.0)
MCV: 89.1 fL (ref 78.0–100.0)
Platelets: 291 10*3/uL (ref 150–400)
RBC: 3.96 MIL/uL — ABNORMAL LOW (ref 4.22–5.81)
RDW: 15 % (ref 11.5–15.5)
WBC: 11.5 10*3/uL — ABNORMAL HIGH (ref 4.0–10.5)

## 2014-11-03 LAB — GLUCOSE, CAPILLARY
Glucose-Capillary: 100 mg/dL — ABNORMAL HIGH (ref 70–99)
Glucose-Capillary: 124 mg/dL — ABNORMAL HIGH (ref 70–99)
Glucose-Capillary: 141 mg/dL — ABNORMAL HIGH (ref 70–99)
Glucose-Capillary: 117 mg/dL — ABNORMAL HIGH (ref 70–99)

## 2014-11-03 MED ORDER — ENSURE COMPLETE PO LIQD
237.0000 mL | Freq: Three times a day (TID) | ORAL | Status: DC
  Administered 2014-11-04 – 2014-11-08 (×15): 237 mL via ORAL

## 2014-11-03 NOTE — Progress Notes (Signed)
Utilization review completed.  

## 2014-11-03 NOTE — Progress Notes (Addendum)
TCTS DAILY ICU PROGRESS NOTE                   [address]            [place] [address]          [phone]   5 Days Post-Op Procedure(s) (LRB): VIDEO ASSISTED THORACOSCOPY (Left) STAPLING OF BLEBS (Left) PLEURADESIS (Left)  Total Length of Stay:  LOS: 6 days   Subjective: Feels ok, no new complaints, + BM  Objective: Vital signs in last 24 hours: Temp:  [97.9 F (36.6 C)-98.9 F (37.2 C)] 98 F (36.7 C) (02/23 0343) Pulse Rate:  [83-100] 83 (02/23 0345) Cardiac Rhythm:  [-] Normal sinus rhythm (02/23 0345) Resp:  [16-24] 20 (02/23 0345) BP: (130-152)/(54-71) 130/65 mmHg (02/23 0345) SpO2:  [78 %-97 %] 97 % (02/23 0345)  Filed Weights   10/28/14 1100 10/31/14 0600 11/01/14 0600  Weight: 146 lb 8 oz (66.452 kg) 145 lb 4.5 oz (65.9 kg) 143 lb 4.8 oz (65 kg)    Weight change:    Hemodynamic parameters for last 24 hours:    Intake/Output from previous day: 02/22 0701 - 02/23 0700 In: 1002.5 [P.O.:720; I.V.:182.5; IV Piggyback:100] Out: 995 [Urine:940; Chest Tube:55]  Intake/Output this shift:    Current Meds: Scheduled Meds: . acetaminophen  1,000 mg Oral 4 times per day   Or  . acetaminophen (TYLENOL) oral liquid 160 mg/5 mL  1,000 mg Oral 4 times per day  . antiseptic oral rinse  7 mL Mouth Rinse q12n4p  . bisacodyl  10 mg Oral Daily  . budesonide-formoterol  2 puff Inhalation BID  . cefUROXime (ZINACEF)  IV  1.5 g Intravenous Q12H  . enoxaparin (LOVENOX) injection  40 mg Subcutaneous Q24H  . guaiFENesin  600 mg Oral BID  . insulin aspart  0-15 Units Subcutaneous TID WC  . insulin aspart  0-5 Units Subcutaneous QHS  . lisinopril  20 mg Oral Daily  . senna-docusate  1 tablet Oral QHS  . sodium chloride  10-40 mL Intracatheter Q12H  . tiotropium  18 mcg Inhalation Daily   Continuous Infusions: . dextrose 5 % and 0.9% NaCl Stopped (11/02/14 2315)   PRN Meds:.albuterol, fluticasone, hydrALAZINE, ipratropium-albuterol, lactulose,  levalbuterol, ondansetron (ZOFRAN) IV, oxyCODONE, potassium chloride, sodium chloride, traMADol  General appearance: alert, cooperative and no distress Heart: regular rate and rhythm Lungs: fair air exchange throughout, + sub q air Abdomen: some distension, soft, nontender Extremities: no edema Wound: incis healing well  Lab Results: CBC: Recent Labs  11/02/14 0331 11/03/14 0445  WBC 9.9 11.5*  HGB 10.6* 11.5*  HCT 32.2* 35.3*  PLT 276 291   BMET:  Recent Labs  11/03/14 0445  NA 136  K 3.8  CL 102  CO2 30  GLUCOSE 107*  BUN 19  CREATININE 0.88  CALCIUM 7.9*    PT/INR: No results for input(s): LABPROT, INR in the last 72 hours. Radiology: Dg Chest 2 View  11/02/2014   CLINICAL DATA:  Pneumatocele  EXAM: CHEST  2 VIEW  COMPARISON:  11/01/2014  FINDINGS: LEFT thoracostomy tubes again identified.  LEFT arm PICC line tip projects over SVC.  Normal heart size, mediastinal contours and pulmonary vascularity.  Severe emphysematous changes with minimal bibasilar atelectasis.  Tiny residual LEFT apex pneumothorax.  Remaining lungs clear.  BILATERAL extensive chest wall emphysema into the cervical regions.  Bones demineralized.  IMPRESSION: COPD changes with minimal bibasilar atelectasis and tiny LEFT pneumothorax despite thoracostomy tubes.  Little interval change.   Electronically Signed   By: Lavonia Dana M.D.   On: 11/02/2014 08:06     Assessment/Plan: S/P Procedure(s) (LRB): VIDEO ASSISTED THORACOSCOPY (Left) STAPLING OF BLEBS (Left) PLEURADESIS (Left)   1 conts with an air leak, lung is expanded- cont H20 seal 2 BP control is improved 3 sats ok on 2 liters- push pulm toilet/cont pulm  rx 4 rehab as able  GOLD,WAYNE E 11/03/2014 8:02 AM   Leave chest tube until air leak resolves Daily cxr patient examined and medical record reviewed,agree with above note. VAN TRIGT III,Armya Westerhoff 11/03/2014

## 2014-11-04 ENCOUNTER — Inpatient Hospital Stay (HOSPITAL_COMMUNITY): Payer: Medicare Other

## 2014-11-04 LAB — CBC
HCT: 34.8 % — ABNORMAL LOW (ref 39.0–52.0)
Hemoglobin: 11.5 g/dL — ABNORMAL LOW (ref 13.0–17.0)
MCH: 29 pg (ref 26.0–34.0)
MCHC: 33 g/dL (ref 30.0–36.0)
MCV: 87.9 fL (ref 78.0–100.0)
Platelets: 325 10*3/uL (ref 150–400)
RBC: 3.96 MIL/uL — ABNORMAL LOW (ref 4.22–5.81)
RDW: 15.1 % (ref 11.5–15.5)
WBC: 14 10*3/uL — ABNORMAL HIGH (ref 4.0–10.5)

## 2014-11-04 LAB — GLUCOSE, CAPILLARY
Glucose-Capillary: 100 mg/dL — ABNORMAL HIGH (ref 70–99)
Glucose-Capillary: 106 mg/dL — ABNORMAL HIGH (ref 70–99)
Glucose-Capillary: 140 mg/dL — ABNORMAL HIGH (ref 70–99)
Glucose-Capillary: 125 mg/dL — ABNORMAL HIGH (ref 70–99)

## 2014-11-04 LAB — URINALYSIS, ROUTINE W REFLEX MICROSCOPIC
Bilirubin Urine: NEGATIVE
Glucose, UA: NEGATIVE mg/dL
HGB URINE DIPSTICK: NEGATIVE
KETONES UR: 15 mg/dL — AB
Leukocytes, UA: NEGATIVE
Nitrite: NEGATIVE
PROTEIN: NEGATIVE mg/dL
Specific Gravity, Urine: 1.02 (ref 1.005–1.030)
UROBILINOGEN UA: 0.2 mg/dL (ref 0.0–1.0)
pH: 6 (ref 5.0–8.0)

## 2014-11-04 MED ORDER — DEXTROSE 5 % IV SOLN
1.5000 g | Freq: Two times a day (BID) | INTRAVENOUS | Status: DC
  Administered 2014-11-04 – 2014-11-08 (×8): 1.5 g via INTRAVENOUS
  Filled 2014-11-04 (×10): qty 1.5

## 2014-11-04 NOTE — Progress Notes (Addendum)
       [address]       [place] [address]             [phone]          6 Days Post-Op Procedure(s) (LRB): VIDEO ASSISTED THORACOSCOPY (Left) STAPLING OF BLEBS (Left) PLEURADESIS (Left)  Subjective: Feels well.  Walked in hall already this am, breathing stable.    Objective: Vital signs in last 24 hours: Patient Vitals for the past 24 hrs:  BP Temp Temp src Pulse Resp SpO2  11/04/14 0700 - 98.3 F (36.8 C) Oral - - -  11/04/14 0550 - - - 97 (!) 27 (!) 78 %  11/04/14 0343 (!) 112/93 mmHg - - 100 (!) 22 92 %  11/03/14 2329 (!) 135/58 mmHg - - 87 13 93 %  11/03/14 2322 - 97.9 F (36.6 C) Oral - - -  11/03/14 1925 112/77 mmHg - - 93 17 95 %  11/03/14 1914 - 98.2 F (36.8 C) Oral - - -  11/03/14 1500 - 98.1 F (36.7 C) Oral - - -  11/03/14 1200 - 98.1 F (36.7 C) Oral - - -   Current Weight  11/01/14 143 lb 4.8 oz (65 kg)     Intake/Output from previous day: 02/23 0701 - 02/24 0700 In: 120 [P.O.:120] Out: 630 [Urine:550; Chest Tube:80]    PHYSICAL EXAM:  Heart: RRR Lungs: Slightly decreased BS in bases Wound: Clean and dry, SQ air decreasing Chest tube: Small 1/7 air leak with talking and respiration, mainly just tidaling with cough    Lab Results: CBC: Recent Labs  11/03/14 0445 11/04/14 0410  WBC 11.5* 14.0*  HGB 11.5* 11.5*  HCT 35.3* 34.8*  PLT 291 325   BMET:  Recent Labs  11/03/14 0445  NA 136  K 3.8  CL 102  CO2 30  GLUCOSE 107*  BUN 19  CREATININE 0.88  CALCIUM 7.9*    PT/INR: No results for input(s): LABPROT, INR in the last 72 hours.  CXR: COMPARISON: Portable chest x-ray of 11/03/2014  FINDINGS: There is little change in the small left apical pneumothorax with a single left chest tube remaining. The tip of the left tube is rather medial in position. Subcutaneous chest wall emphysema remains left-greater-than-right. A left PICC line tip overlies the mid SVC. The right lung is clear. Heart size is  stable.  IMPRESSION: No change in small left apical pneumothorax with left chest tube remaining.  Assessment/Plan: S/P Procedure(s) (LRB): VIDEO ASSISTED THORACOSCOPY (Left) STAPLING OF BLEBS (Left) PLEURADESIS (Left) CT with intermittent 1/7 air leak with talking and respiration, but mainly tidaling with cough.  CXR stable, decreased SQ air and stable ptx. Continue CT to water seal.  Possibly could consider switching to Mini Express soon. Leukocytosis- WBC continues to trend up 8.0->9.9-> 11.5->14.  No fever.  Likely atelectasis as wounds stable and no cough, but will check u/a.  Recheck labs in am.   LOS: 7 days    COLLINS,GINA H 11/04/2014  Briefly clamped chest tube- with release there was an air leak so leave tube, pleurovac  Slight drainage and erythema around old chest tube site- cont IV antibiotic Other incisions clean- personally inspectecd  patient examined and medical record reviewed,agree with above note. VAN TRIGT III,PETER 11/04/2014

## 2014-11-05 ENCOUNTER — Inpatient Hospital Stay (HOSPITAL_COMMUNITY): Payer: Medicare Other

## 2014-11-05 LAB — GLUCOSE, CAPILLARY
Glucose-Capillary: 140 mg/dL — ABNORMAL HIGH (ref 70–99)
Glucose-Capillary: 158 mg/dL — ABNORMAL HIGH (ref 70–99)
Glucose-Capillary: 130 mg/dL — ABNORMAL HIGH (ref 70–99)
Glucose-Capillary: 141 mg/dL — ABNORMAL HIGH (ref 70–99)

## 2014-11-05 LAB — CBC
HCT: 30.5 % — ABNORMAL LOW (ref 39.0–52.0)
Hemoglobin: 10 g/dL — ABNORMAL LOW (ref 13.0–17.0)
MCH: 28.9 pg (ref 26.0–34.0)
MCHC: 32.8 g/dL (ref 30.0–36.0)
MCV: 88.2 fL (ref 78.0–100.0)
Platelets: 306 10*3/uL (ref 150–400)
RBC: 3.46 MIL/uL — ABNORMAL LOW (ref 4.22–5.81)
RDW: 15.6 % — ABNORMAL HIGH (ref 11.5–15.5)
WBC: 12.3 10*3/uL — ABNORMAL HIGH (ref 4.0–10.5)

## 2014-11-05 MED ORDER — ACETAMINOPHEN 325 MG PO TABS
650.0000 mg | ORAL_TABLET | Freq: Four times a day (QID) | ORAL | Status: DC | PRN
  Administered 2014-11-05: 650 mg via ORAL
  Filled 2014-11-05: qty 2

## 2014-11-05 NOTE — Progress Notes (Signed)
O2 sats sitting in chair with 2L O2 via [place]= 98%  O2 Sats walking on 2L O2 [place]= 93%  O2 Sats walking on RA= 86-87%

## 2014-11-05 NOTE — Progress Notes (Addendum)
       [address]       [place] [address]             [phone]          7 Days Post-Op Procedure(s) (LRB): VIDEO ASSISTED THORACOSCOPY (Left) STAPLING OF BLEBS (Left) PLEURADESIS (Left)  Subjective: Felt his breathing was much better this am when out walking.  No complaints.   Objective: Vital signs in last 24 hours: Patient Vitals for the past 24 hrs:  BP Temp Temp src Pulse Resp SpO2  11/05/14 0751 - - - - - 93 %  11/05/14 0338 (!) 114/52 mmHg 97.8 F (36.6 C) Oral 78 15 95 %  11/04/14 2338 (!) 121/58 mmHg 97.9 F (36.6 C) Oral 80 17 96 %  11/04/14 1949 - - - - - 93 %  11/04/14 1935 (!) 107/56 mmHg 98 F (36.7 C) Oral 96 (!) 22 93 %  11/04/14 1613 - 98.6 F (37 C) Oral - - -  11/04/14 1557 (!) 105/46 mmHg - - 97 13 91 %  11/04/14 1350 110/83 mmHg - - (!) 104 18 93 %  11/04/14 1300 - 97.8 F (36.6 C) Oral - - -   Current Weight  11/01/14 143 lb 4.8 oz (65 kg)   CBGs 140-125  Intake/Output from previous day: 02/24 0701 - 02/25 0700 In: 170 [P.O.:120; IV Piggyback:50] Out: 600 [Urine:600]    PHYSICAL EXAM:  Heart: RRR Lungs: Slightly decreased on left Wound: Clean and dry, small amount serous drainage around CT Chest tube: +air leak with cough    Lab Results: CBC: Recent Labs  11/04/14 0410 11/05/14 0415  WBC 14.0* 12.3*  HGB 11.5* 10.0*  HCT 34.8* 30.5*  PLT 325 306   BMET:  Recent Labs  11/03/14 0445  NA 136  K 3.8  CL 102  CO2 30  GLUCOSE 107*  BUN 19  CREATININE 0.88  CALCIUM 7.9*    PT/INR: No results for input(s): LABPROT, INR in the last 72 hours.  U/A negative   Assessment/Plan: S/P Procedure(s) (LRB): VIDEO ASSISTED THORACOSCOPY (Left) STAPLING OF BLEBS (Left) PLEURADESIS (Left)  CT with +air leak, CXR unchanged. Continue CT to water seal. Consider changing to Mini Express soon.  Leukocytosis- WBC down today, u/a negative, no fever. Continue Zinacef.  Pt received stool softeners and  laxatives yesterday and had 3 stools, then placed on contact precautions.  He has not had any more BMs since then.  Since his WBC is trending down and increased BMs seem related to multiple bowel stimulants, will d/c contact precautions and monitor since he has been on prolonged antibiotics.  Continue ambulation, pulm toilet.   LOS: 8 days    COLLINS,GINA H 11/05/2014 All path benign on wedge resections patient examined and medical record reviewed,agree with above note.Will replace pleurovac with Miniexpress Will keep in hospital Until Sun - if air leak persists  then home to [place] with [facility] to care for Miniexpress and F/u office appt w/ CXR March 9. Cont po Keflex if pt goes home with tube VAN TRIGT III,PETER 11/05/2014

## 2014-11-06 ENCOUNTER — Inpatient Hospital Stay (HOSPITAL_COMMUNITY): Payer: Medicare Other

## 2014-11-06 LAB — GLUCOSE, CAPILLARY
Glucose-Capillary: 113 mg/dL — ABNORMAL HIGH (ref 70–99)
Glucose-Capillary: 127 mg/dL — ABNORMAL HIGH (ref 70–99)
Glucose-Capillary: 156 mg/dL — ABNORMAL HIGH (ref 70–99)
Glucose-Capillary: 154 mg/dL — ABNORMAL HIGH (ref 70–99)

## 2014-11-06 LAB — CBC
HEMATOCRIT: 30.1 % — AB (ref 39.0–52.0)
Hemoglobin: 9.8 g/dL — ABNORMAL LOW (ref 13.0–17.0)
MCH: 28.9 pg (ref 26.0–34.0)
MCHC: 32.6 g/dL (ref 30.0–36.0)
MCV: 88.8 fL (ref 78.0–100.0)
Platelets: 323 10*3/uL (ref 150–400)
RBC: 3.39 MIL/uL — AB (ref 4.22–5.81)
RDW: 15.7 % — ABNORMAL HIGH (ref 11.5–15.5)
WBC: 13.4 10*3/uL — AB (ref 4.0–10.5)

## 2014-11-06 NOTE — Progress Notes (Signed)
Utilization review completed.  

## 2014-11-06 NOTE — Progress Notes (Signed)
Physician notified: Roxy Manns At: 1910  Regarding: SOB, on 2LPM [place], labored breathing, exp wheezes prior to breathing treatment-now diminished shallow, CT to mini express water seal, known air leak, family concerned.  Awaiting return response.   Returned Response at: 1911  Order(s): cxr, place chest tube to -20cm suction if pneumothorax present.

## 2014-11-06 NOTE — Care Management Note (Signed)
Page 1 of 2   11/09/2014     4:12:16 PM CARE MANAGEMENT NOTE 11/09/2014  Patient:  Alan Huff, Alan Huff   Account Number:  [id number]  Date Initiated:  11/03/2014  Documentation initiated by:  Marvetta Gibbons  Subjective/Objective Assessment:   Pt tx from [facility] with persistent airleak and CT- s/p VATS on 2/18     Action/Plan:   PTA pt lived at home   Anticipated DC Date:  11/08/2014   Anticipated DC Plan:  [place]  CM consult      Choice offered to / List presented to:  NA   DME arranged  [facility]      DME agency  [place]     [facility] arranged  HH-1 RN      [place]   Status of service:  Completed, signed off Medicare Important Message given?  NO (If response is "NO", the following Medicare IM given date fields will be blank) Date Medicare IM given:   Medicare IM given by:   Date Additional Medicare IM given:   Additional Medicare IM given by:    Discharge Disposition:  [place]  Per UR Regulation:  Reviewed for med. necessity/level of care/duration of stay  If discussed at [place] of Stay Meetings, dates discussed:   11/03/2014  11/05/2014    Comments:  11/09/14- 1000- Marvetta Gibbons RN, BSN [phone] F/u call made to [facility] this am- message left for Jana Half- with [facility]- [phone] ext [phone]- regarding [place] and DME needs- return call by Jackelyn Poling with [facility] Transfer Coord received- faxed needed info again this am- 02 will be arranged with [place]- will hear from them directly regarding timeframe for 02 delivery to room. Still awaiting call regarding [place] arrangements. 1230-Received call from [place] will be delivered to room between 2:00-2:30 pm this afternoon- pt notified of this- still awaiting word regarding [place] arrangements- will call pt at home once we have heard from [place] regarding the [facility] agency that they have set things  up with. 1600- received call from [facility]- they have made arrangements for [place] with [facility] out of [place] services to begin 11/10/14. will call pt and let him know arrangements.  11/06/14- 1000- Marvetta Gibbons RN< BSN [phone] Pt may need to go home on home 02 and have an RN for chest tube management- orders placed- pt is [facility]- and reports that his PCP with the [place] is at the [facility]- [phone]) Call made to the [facility] and spoke with the primary care medical group one- regarding the d/c needs- pt's PCP is Dr. Hughie Closs- received a return call from the nurse thereJana Half at [facility]- who stated that orders would need to be faxed to the Transfer Coord. per the CSW at the office in order to try to get approval for services needed from pt's primary care PCP- orders faxed to [phone] as requested. Awaiting return call from the [place] regarding the d/c needs and who they need to be arranged with both 02 and HH. update- 1638- call made again to [facility]- message left for Jana Half at [facility]- regarding HH/DME needs- as of 5 pm have not heard back from [place] regarding approval for HH/DME needs for pt- will need [facility] approval before CM can make arrangements for home 02 and HH needs- Primary Care office will not be  open again until Monday- CM will f/u again Monday Morning regarding pt needs.  Pts last 4 of SS- [phone]   11/03/14- 1100- Marvetta Gibbons RN BSN [phone] Pt continues to have airleak with Chest tube x1 to water-seal. NCM to continue to follow for potential d/c needs

## 2014-11-06 NOTE — Discharge Summary (Signed)
[address]       [place] [address]             [phone]              Discharge Summary  Name: Alan Huff DOB: April 01, 1946 69 y.o. 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 male with a history of a left lung nodule (apparently followed by [place] in [place] previously) COPD, tobacco abuse (quit in 1994) , and hypertension. He woke up around 1 a.m. on 10/24/2014, and after letting the dog outside, he felt a sharp pain in his chest and had difficulty breathing. He summoned EMS and was taken to [facility]. A chest x-ray done in the ER showed a large left pneumothorax. A chest tube was placed and he was transferred to the ICU.   According to medical records, there was air leak after placement of the chest tube. The chest tube was replaced. The air leak gradually resolved. The left chest tube was placed to water seal on 10/27/2014 and there was a small air leak noted. On 10/28/2014, he developed marked increase in subcutaneous emphysema and the air leak was worse. The left chest tube was placed back to suction. It was felt he needed to be evaluated by a thoracic surgeon. Dr. Prescott Gum accepted the patient in transfer to [facility] for further evaluation and treatment.    Hospital Course:  The patient was admitted to [facility] on 10/28/2014. It was felt that he would require a left VATS for bleb resection and pleurodesis.  All risks, benefits and alternatives of surgery were explained in detail, and the patient agreed to  proceed. The patient was taken to the operating room and underwent the above procedure.    The postoperative course has been notable for a prolonged air leak and extensive subcutaneous emphysema which initially extended throughout his chest up into his neck and face.  He was treated conservatively and chest tube was left on suction.  Over time, the subcutaneous emphysema improved and the air leak decreased.  His chest tube was weaned to water seal and ultimately switched to a Mini Express.  His chest x-rays have remained stable, with a persistent small apical pneumothorax.  Incisions are healing well.  The patient continues to require 2 liters of supplemental oxygen as his saturations drop with exertion, therefore, home oxygen has been arranged.  He also developed a leukocytosis up to 14,00 without fever.  Urinalysis was negative and there was no other source of infection.  He was continued on IV Zinacef and his white blood count has trended down.    He otherwise is doing well. He is ambulating in the halls without difficulty and is tolerating a regular diet.  We anticipate discharge home in the next 48 hours with the Mini Express provided no acute changes occur.  We will arrange home health to assist with chest tube management and to remove incisional sutures in 1 week. He will be discharged on home O2  Recent vital signs:  Filed Vitals:   11/06/14 0710  BP: 114/50  Pulse: 85  Temp: 97.9 F (36.6 C)  Resp: 17    Recent laboratory studies:  CBC: Recent Labs  11/05/14 0415 11/06/14 0423  WBC 12.3* 13.4*  HGB 10.0* 9.8*  HCT 30.5* 30.1*  PLT 306 323   BMET: No results for input(s): NA, K, CL, CO2, GLUCOSE, BUN, CREATININE, CALCIUM in the last 72 hours.  PT/INR: No results for input(s): LABPROT, INR in the last 72 hours.     Medication List    TAKE these medications        albuterol 108 (90 BASE) MCG/ACT inhaler  Commonly known as:  PROVENTIL HFA;VENTOLIN HFA  Inhale 2 puffs  into the lungs every 6 (six) hours as needed for wheezing or shortness of breath.     budesonide-formoterol 160-4.5 MCG/ACT inhaler  Commonly known as:  SYMBICORT  Inhale 2 puffs into the lungs 2 (two) times daily.     cephALEXin 250 MG capsule  Commonly known as:  KEFLEX  Take 1 capsule (250 mg total) by mouth 3 (three) times daily.     COMBIVENT IN  Inhale 1 puff into the lungs 4 (four) times daily.     fluticasone 50 MCG/ACT nasal spray  Commonly known as:  FLONASE  Place 1 spray into both nostrils as needed for rhinitis.     guaiFENesin 600 MG 12 hr tablet  Commonly known as:  MUCINEX  Take 1 tablet (600 mg total) by mouth 2 (two) times daily.     lisinopril 20 MG tablet  Commonly known as:  PRINIVIL,ZESTRIL  Take 10 mg by mouth daily.     Oxycodone HCl 10 MG Tabs  Take 1 tablet (10 mg total) by mouth every 6 (six) hours as needed for severe pain.     tiotropium 18 MCG inhalation capsule  Commonly known as:  SPIRIVA  Place 18 mcg into inhaler and inhale daily.        Discharge Instructions:  The patient is to refrain from driving, heavy lifting or strenuous activity.  May shower daily and clean incisions with soap and water.  May resume regular diet.  Home health will remove sutures in 1 week.   Follow Up: Follow-up Information    Follow up with VAN Wilber Oliphant, MD On 11/18/2014.   Specialty:  Cardiothoracic Surgery   Why:  Have a chest x-ray at [place] at 8:30, then see MD at 9:30   Contact information:   [address] [place] [address] [place] 11/06/2014, 11:18 AM

## 2014-11-06 NOTE — Progress Notes (Addendum)
       [address]       [place] [address]             [phone]          8 Days Post-Op Procedure(s) (LRB): VIDEO ASSISTED THORACOSCOPY (Left) STAPLING OF BLEBS (Left) PLEURADESIS (Left)  Subjective: "I can feel a big difference today." Feels a lot better overall. No complaints.   Objective: Vital signs in last 24 hours: Patient Vitals for the past 24 hrs:  BP Temp Temp src Pulse Resp SpO2  11/06/14 0710 (!) 114/50 mmHg 97.9 F (36.6 C) Oral 85 17 96 %  11/06/14 0347 (!) 107/53 mmHg 97.7 F (36.5 C) Oral 88 19 99 %  11/05/14 2314 (!) 117/52 mmHg 97.9 F (36.6 C) Oral 90 (!) 21 97 %  11/05/14 1958 - - - - - 95 %  11/05/14 1906 (!) 131/52 mmHg 97.9 F (36.6 C) Oral 99 20 98 %  11/05/14 1628 140/65 mmHg - - 100 11 98 %  11/05/14 1442 119/64 mmHg - - 92 (!) 21 98 %  11/05/14 1115 (!) 103/48 mmHg - - 96 (!) 21 97 %  11/05/14 1100 - 97.5 F (36.4 C) Oral - - -  11/05/14 0817 (!) 124/47 mmHg 97.4 F (36.3 C) Oral 92 17 96 %   Current Weight  11/01/14 143 lb 4.8 oz (65 kg)     Intake/Output from previous day: 02/25 0701 - 02/26 0700 In: 597 [P.O.:477; I.V.:20; IV Piggyback:100] Out: 660 [Urine:650; Chest Tube:10]    PHYSICAL EXAM:  Heart: RRR Lungs: Slightly decreased BS on L Wound: Clean and dry Chest tube: No obvious air leak, minimal drainage    Lab Results: CBC: Recent Labs  11/05/14 0415 11/06/14 0423  WBC 12.3* 13.4*  HGB 10.0* 9.8*  HCT 30.5* 30.1*  PLT 306 323   BMET: No results for input(s): NA, K, CL, CO2, GLUCOSE, BUN, CREATININE, CALCIUM in the last 72 hours.  PT/INR: No results for input(s): LABPROT, INR in the last 72 hours.  CXR: FINDINGS: The lungs are well-expanded. There is a small left pneumothorax amounting to 10% of the lung volume. The left chest tube is unchanged in position with the tip projecting over approximately T6. The interstitial markings of both lungs are increased. There is a trace of  pleural fluid on the left. The heart and pulmonary vascularity are normal. There is persistent subcutaneous emphysema on the left. The tip of the left-sided PICC line projects over the midportion of the SVC.  IMPRESSION: 1. Persistent 10% left apical pneumothorax. Trace left pleural effusion. Left chest tube unchanged in position. 2. COPD. Minimal right basilar subsegmental atelectasis. No CHF.   Assessment/Plan: S/P Procedure(s) (LRB): VIDEO ASSISTED THORACOSCOPY (Left) STAPLING OF BLEBS (Left) PLEURADESIS (Left)  CXR stable, no obvious air leak today. Continue CT to Mini Express today.  Will monitor and hopefully d/c home Sunday with Mini Express.  Leukocytosis- WBC stable.  Continue Zinacef, will switch to po Keflex at d/c.  Continue ambulation, pulm toilet.  Pulm- desats with exertion, will arrange home O2.   LOS: 9 days    COLLINS,GINA H 11/06/2014  patient examined and medical record reviewed,agree with above note. VAN TRIGT III,Azaliyah Kennard 11/06/2014

## 2014-11-07 ENCOUNTER — Inpatient Hospital Stay (HOSPITAL_COMMUNITY): Payer: Medicare Other

## 2014-11-07 LAB — GLUCOSE, CAPILLARY
Glucose-Capillary: 95 mg/dL (ref 70–99)
Glucose-Capillary: 119 mg/dL — ABNORMAL HIGH (ref 70–99)
Glucose-Capillary: 128 mg/dL — ABNORMAL HIGH (ref 70–99)

## 2014-11-07 NOTE — Progress Notes (Signed)
SATURATION QUALIFICATIONS: (This note is used to comply with regulatory documentation for home oxygen)  Patient Saturations on Room Air at Rest = 91%  Patient Saturations on Room Air while Ambulating = 77%  Patient Saturations on 4 Liters of oxygen while Ambulating = 90%  Please briefly explain why patient needs home oxygen: Pt. Oxygen saturation dropped from 91% to 77% at fifty feet.  Applied oxygen at 2L once patients oxygen saturation was above 94% patient began ambulating.  Patients oxygen saturation dropped to 87% increased the oxygen up to 4L [place] and patient maintained an oxygen saturation of 90% while ambulating.

## 2014-11-07 NOTE — Progress Notes (Addendum)
TCTS DAILY ICU PROGRESS NOTE                   [place]            [place] [address]          [phone]   9 Days Post-Op Procedure(s) (LRB): VIDEO ASSISTED THORACOSCOPY (Left) STAPLING OF BLEBS (Left) PLEURADESIS (Left)  Total Length of Stay:  LOS: 10 days   Subjective: Feels fine , breathing is comfortable   Objective: Vital signs in last 24 hours: Temp:  [98.1 F (36.7 C)-98.9 F (37.2 C)] 98.7 F (37.1 C) (02/27 0700) Pulse Rate:  [79-100] 79 (02/27 0740) Cardiac Rhythm:  [-] Normal sinus rhythm (02/27 0740) Resp:  [15-21] 16 (02/27 0740) BP: (105-138)/(48-58) 127/52 mmHg (02/27 0740) SpO2:  [93 %-100 %] 97 % (02/27 0740)  Filed Weights   10/28/14 1100 10/31/14 0600 11/01/14 0600  Weight: 146 lb 8 oz (66.452 kg) 145 lb 4.5 oz (65.9 kg) 143 lb 4.8 oz (65 kg)    Weight change:    Hemodynamic parameters for last 24 hours:    Intake/Output from previous day: 02/26 0701 - 02/27 0700 In: 240 [P.O.:120; I.V.:20; IV Piggyback:100] Out: 485 [Urine:450; Chest Tube:35]  Intake/Output this shift:    Current Meds: Scheduled Meds: . antiseptic oral rinse  7 mL Mouth Rinse q12n4p  . bisacodyl  10 mg Oral Daily  . budesonide-formoterol  2 puff Inhalation BID  . cefUROXime (ZINACEF)  IV  1.5 g Intravenous Q12H  . enoxaparin (LOVENOX) injection  40 mg Subcutaneous Q24H  . feeding supplement (ENSURE COMPLETE)  237 mL Oral TID WC  . guaiFENesin  600 mg Oral BID  . insulin aspart  0-15 Units Subcutaneous TID WC  . insulin aspart  0-5 Units Subcutaneous QHS  . lisinopril  20 mg Oral Daily  . senna-docusate  1 tablet Oral QHS  . sodium chloride  10-40 mL Intracatheter Q12H  . tiotropium  18 mcg Inhalation Daily   Continuous Infusions: . dextrose 5 % and 0.9% NaCl Stopped (11/02/14 2315)   PRN Meds:.acetaminophen, albuterol, fluticasone, hydrALAZINE, ipratropium-albuterol, lactulose, levalbuterol, ondansetron (ZOFRAN) IV, oxyCODONE, potassium  chloride, sodium chloride, traMADol  General appearance: alert, cooperative and no distress Heart: regular rate and rhythm Lungs: clear to auscultation bilaterally Abdomen: benign Extremities: no edema, warm Wound: incis healing well  Lab Results: CBC: Recent Labs  11/05/14 0415 11/06/14 0423  WBC 12.3* 13.4*  HGB 10.0* 9.8*  HCT 30.5* 30.1*  PLT 306 323   BMET: No results for input(s): NA, K, CL, CO2, GLUCOSE, BUN, CREATININE, CALCIUM in the last 72 hours.  PT/INR: No results for input(s): LABPROT, INR in the last 72 hours. Radiology: Dg Chest 2 View  11/06/2014   CLINICAL DATA:  Recent left thoracotomy, stapling of blebs and pleurodesis  EXAM: CHEST  2 VIEW  COMPARISON:  Portable chest x-ray of November 05, 2014 and November 04, 2014 peer  FINDINGS: The lungs are well-expanded. There is a small left pneumothorax amounting to 10% of the lung volume. The left chest tube is unchanged in position with the tip projecting over approximately T6. The interstitial markings of both lungs are increased. There is a trace of pleural fluid on the left. The heart and pulmonary vascularity are normal. There is persistent subcutaneous emphysema on the left. The tip of the left-sided PICC line projects over the midportion of the SVC.  IMPRESSION: 1. Persistent 10% left apical pneumothorax. Trace left pleural  effusion. Left chest tube unchanged in position. 2. COPD.  Minimal right basilar subsegmental atelectasis.  No CHF.   Electronically Signed   By: David  Martinique   On: 11/06/2014 07:50   Dg Chest Port 1 View  11/06/2014   CLINICAL DATA:  Shortness of breath, weakness.  EXAM: PORTABLE CHEST - 1 VIEW  COMPARISON:  11/06/2014  FINDINGS: Support devices including left chest tube remain in place, unchanged. Small left apical pneumothorax again noted, not significantly changed. Stable subcutaneous emphysema. Bibasilar atelectasis is similar prior study. Heart is normal size.  IMPRESSION: Stable small left  apical pneumothorax with left chest tube in place.  Bibasilar atelectasis.  Stable subcutaneous emphysema.   Electronically Signed   By: Rolm Baptise M.D.   On: 11/06/2014 19:46     Assessment/Plan: S/P Procedure(s) (LRB): VIDEO ASSISTED THORACOSCOPY (Left) STAPLING OF BLEBS (Left) PLEURADESIS (Left)  1 stable 2 conts current rx/tx 3 plan for d/c in am with mini-express/home O2/po keflex     GOLD,WAYNE E 11/07/2014 10:07 AM   I have seen and examined the patient and agree with the assessment and plan as outlined.  Potentially ready for d/c soon, although family member (daughter?) was reportedly very worried about his respiratory status yesterday evening.  At present he looks quite comfortable and the patient denies any problems yesterday other than receiving morning inhalers late.  Given the severity of underlying COPD I would hold d/c unless it's quite clear that he is stable and won't wind up back in ED with shortness of breath.  OWEN,CLARENCE H 11/07/2014 11:39 AM

## 2014-11-08 LAB — GLUCOSE, CAPILLARY
Glucose-Capillary: 161 mg/dL — ABNORMAL HIGH (ref 70–99)
Glucose-Capillary: 141 mg/dL — ABNORMAL HIGH (ref 70–99)
Glucose-Capillary: 107 mg/dL — ABNORMAL HIGH (ref 70–99)
Glucose-Capillary: 126 mg/dL — ABNORMAL HIGH (ref 70–99)

## 2014-11-08 MED ORDER — CEPHALEXIN 250 MG PO CAPS: 250.0000 mg | ORAL_CAPSULE | Freq: Three times a day (TID) | ORAL | Status: DC

## 2014-11-08 MED ORDER — OXYCODONE HCL 10 MG PO TABS: 10.0000 mg | ORAL_TABLET | Freq: Four times a day (QID) | ORAL | Status: DC | PRN

## 2014-11-08 MED ORDER — GUAIFENESIN ER 600 MG PO TB12: 600.0000 mg | ORAL_TABLET | Freq: Two times a day (BID) | ORAL | Status: DC

## 2014-11-08 MED ORDER — CEPHALEXIN 250 MG PO CAPS
250.0000 mg | ORAL_CAPSULE | Freq: Two times a day (BID) | ORAL | Status: DC
  Administered 2014-11-09: 250 mg via ORAL
  Filled 2014-11-08 (×2): qty 1

## 2014-11-08 MED ORDER — FLUTICASONE PROPIONATE 50 MCG/ACT NA SUSP: 1.0000 | NASAL | Status: DC | PRN

## 2014-11-08 NOTE — Progress Notes (Addendum)
[facility] [address]       [place] [address]             [phone]      10 Days Post-Op Procedure(s) (LRB): VIDEO ASSISTED THORACOSCOPY (Left) STAPLING OF BLEBS (Left) PLEURADESIS (Left) Subjective: conts to feel better, breathing is comfortable with O2  Objective: Vital signs in last 24 hours: Temp:  [97.8 F (36.6 C)-98.7 F (37.1 C)] 97.8 F (36.6 C) (02/28 0700) Pulse Rate:  [82-93] 82 (02/28 0345) Cardiac Rhythm:  [-] Normal sinus rhythm (02/28 0745) Resp:  [15-22] 15 (02/28 0345) BP: (98-152)/(48-117) 123/54 mmHg (02/28 0345) SpO2:  [94 %-97 %] 97 % (02/28 0345)  Hemodynamic parameters for last 24 hours:    Intake/Output from previous day: 02/27 0701 - 02/28 0700 In: 190 [P.O.:120; I.V.:20; IV Piggyback:50] Out: 0076 [Urine:1650; Chest Tube:60] Intake/Output this shift:    General appearance: alert, cooperative and no distress Heart: regular rate and rhythm Lungs: fair air exchange throughout Abdomen: benign Extremities: warm, no edema Wound: incis healing well  Lab Results:  Recent Labs  11/06/14 0423  WBC 13.4*  HGB 9.8*  HCT 30.1*  PLT 323   BMET: No results for input(s): NA, K, CL, CO2, GLUCOSE, BUN, CREATININE, CALCIUM in the last 72 hours.  PT/INR: No results for input(s): LABPROT, INR in the last 72 hours. ABG    Component Value Date/Time   PHART 7.429 10/30/2014 0500   HCO3 24.5* 10/30/2014 0500   TCO2 25.7 10/30/2014 0500   O2SAT 96.4 10/30/2014 0500   CBG (last 3)   Recent Labs  11/07/14 1157 11/07/14 1401 11/07/14 2125  GLUCAP 119* 128* 161*    Meds Scheduled Meds: . antiseptic oral rinse  7 mL Mouth Rinse q12n4p  . bisacodyl  10 mg Oral Daily  . budesonide-formoterol  2 puff Inhalation BID  . cefUROXime (ZINACEF)  IV  1.5 g Intravenous Q12H  . enoxaparin (LOVENOX) injection  40 mg Subcutaneous Q24H  . feeding supplement (ENSURE COMPLETE)  237 mL Oral TID WC  . guaiFENesin  600 mg Oral BID  . insulin  aspart  0-15 Units Subcutaneous TID WC  . insulin aspart  0-5 Units Subcutaneous QHS  . lisinopril  20 mg Oral Daily  . senna-docusate  1 tablet Oral QHS  . sodium chloride  10-40 mL Intracatheter Q12H  . tiotropium  18 mcg Inhalation Daily   Continuous Infusions: . dextrose 5 % and 0.9% NaCl Stopped (11/02/14 2315)   PRN Meds:.acetaminophen, albuterol, fluticasone, hydrALAZINE, ipratropium-albuterol, lactulose, levalbuterol, ondansetron (ZOFRAN) IV, oxyCODONE, potassium chloride, sodium chloride, traMADol  Xrays Dg Chest 2 View  11/07/2014   CLINICAL DATA:  Recent thoracoscopy with chest tube placement  EXAM: CHEST  2 VIEW  COMPARISON:  11/06/2014  FINDINGS: Left chest tube is again identified in satisfactory position. Stable left apical pneumothorax is noted. A left-sided PICC line is seen. A small amount subcutaneous emphysema is noted bilaterally. Right lung remains clear. Mild scarring is noted in the bases bilaterally. The cardiac shadow is stable.  IMPRESSION: Stable left apical pneumothorax.   Electronically Signed   By: Inez Catalina M.D.   On: 11/07/2014 09:32   Dg Chest Port 1 View  11/06/2014   CLINICAL DATA:  Shortness of breath, weakness.  EXAM: PORTABLE CHEST - 1 VIEW  COMPARISON:  11/06/2014  FINDINGS: Support devices including left chest tube remain in place, unchanged. Small left apical pneumothorax again noted, not significantly changed. Stable subcutaneous emphysema.  Bibasilar atelectasis is similar prior study. Heart is normal size.  IMPRESSION: Stable small left apical pneumothorax with left chest tube in place.  Bibasilar atelectasis.  Stable subcutaneous emphysema.   Electronically Signed   By: Rolm Baptise M.D.   On: 11/06/2014 19:46    Assessment/Plan: S/P Procedure(s) (LRB): VIDEO ASSISTED THORACOSCOPY (Left) STAPLING OF BLEBS (Left) PLEURADESIS (Left)   1 CT- mini express with air leak, cont at discharge 2 Home O2  3 pulm status appears stable on current rx 4  d/c home today     LOS: 11 days    GOLD,WAYNE E 11/08/2014  I have seen and examined the patient and agree with the assessment and plan as outlined.  Harris Kistler H 11/08/2014

## 2014-11-09 ENCOUNTER — Inpatient Hospital Stay (HOSPITAL_COMMUNITY): Payer: Medicare Other

## 2014-11-09 LAB — GLUCOSE, CAPILLARY
Glucose-Capillary: 114 mg/dL — ABNORMAL HIGH (ref 70–99)
Glucose-Capillary: 95 mg/dL (ref 70–99)
Glucose-Capillary: 97 mg/dL (ref 70–99)

## 2014-11-09 NOTE — Progress Notes (Addendum)
      [facility] [address]       [place] [address]             [phone]      11 Days Post-Op Procedure(s) (LRB): VIDEO ASSISTED THORACOSCOPY (Left) STAPLING OF BLEBS (Left) PLEURADESIS (Left)   Subjective:  Alan Huff has no complaints.  He is awaiting home oxygen to be arranged via the [place].  Objective: Vital signs in last 24 hours: Temp:  [97.5 F (36.4 C)-98.9 F (37.2 C)] 98 F (36.7 C) (02/29 0728) Pulse Rate:  [79-100] 79 (02/29 0430) Cardiac Rhythm:  [-] Normal sinus rhythm (02/29 0430) Resp:  [15-26] 18 (02/29 0430) BP: (107-120)/(45-56) 107/55 mmHg (02/29 0430) SpO2:  [93 %-100 %] 99 % (02/29 0728)  Intake/Output from previous day: 02/28 0701 - 02/29 0700 In: 360 [P.O.:360] Out: 905 [Urine:875; Chest Tube:30]  General appearance: alert, cooperative and no distress Heart: regular rate and rhythm Lungs: clear to auscultation bilaterally Abdomen: soft, non-tender; bowel sounds normal; no masses,  no organomegaly Wound: clean and dry  Lab Results: No results for input(s): WBC, HGB, HCT, PLT in the last 72 hours. BMET: No results for input(s): NA, K, CL, CO2, GLUCOSE, BUN, CREATININE, CALCIUM in the last 72 hours.  PT/INR: No results for input(s): LABPROT, INR in the last 72 hours. ABG    Component Value Date/Time   PHART 7.429 10/30/2014 0500   HCO3 24.5* 10/30/2014 0500   TCO2 25.7 10/30/2014 0500   O2SAT 96.4 10/30/2014 0500   CBG (last 3)   Recent Labs  11/08/14 0844 11/08/14 1308 11/08/14 2133  GLUCAP 141* 107* 126*    Assessment/Plan: S/P Procedure(s) (LRB): VIDEO ASSISTED THORACOSCOPY (Left) STAPLING OF BLEBS (Left) PLEURADESIS (Left)  1. CT- Mini Express remains in place, air leak present 2. Home oxygen- currently being arranged via the [facility], awaiting this to be completed 3. Dispo- plan to d/c home today if oxygen arranged, CXR ordered by Dr. Prescott Gum this morning has not been completed   LOS: 12 days    BARRETT,  ERIN 11/09/2014  CXR this am ok for DC home today [facility] for chest tube care Cont po keflex at home  patient examined and medical record reviewed,agree with above note. VAN TRIGT III,Jerimie Mancuso 11/09/2014

## 2014-11-16 ENCOUNTER — Other Ambulatory Visit: Payer: Self-pay | Admitting: Cardiothoracic Surgery

## 2014-11-16 DIAGNOSIS — J439 Emphysema, unspecified: Secondary | ICD-10-CM

## 2014-11-18 ENCOUNTER — Ambulatory Visit
Admission: RE | Admit: 2014-11-18 | Discharge: 2014-11-18 | Disposition: A | Discharge status: Home / Self Care | Payer: Non-veteran care | Source: Ambulatory Visit | Attending: Cardiothoracic Surgery | Admitting: Cardiothoracic Surgery

## 2014-11-18 ENCOUNTER — Ambulatory Visit (INDEPENDENT_AMBULATORY_CARE_PROVIDER_SITE_OTHER): Payer: Non-veteran care | Admitting: Cardiothoracic Surgery

## 2014-11-18 ENCOUNTER — Encounter: Payer: Self-pay | Admitting: Cardiothoracic Surgery

## 2014-11-18 VITALS — BP 127/71 | HR 100 | Resp 18 | Ht 67.0 in | Wt 138.0 lb

## 2014-11-18 DIAGNOSIS — J439 Emphysema, unspecified: Secondary | ICD-10-CM

## 2014-11-18 DIAGNOSIS — J449 Chronic obstructive pulmonary disease, unspecified: Secondary | ICD-10-CM

## 2014-11-18 DIAGNOSIS — IMO0002 Reserved for concepts with insufficient information to code with codable children: Secondary | ICD-10-CM

## 2014-11-18 DIAGNOSIS — J939 Pneumothorax, unspecified: Secondary | ICD-10-CM

## 2014-11-18 DIAGNOSIS — J9382 Other air leak: Secondary | ICD-10-CM

## 2014-11-18 NOTE — Progress Notes (Signed)
PCP is No primary care provider on file. Referring Provider is Wynetta Emery, PA-C  Chief Complaint  Patient presents with  . Routine Post Op    s/p L VATS, STAPLING OF BLEBS, ETC..10/29/14....with MINI-EXPRESS on discharge    HQI:ONGEXB office visit after left VATS resection of blebs and pleurodesis for spontaneous pneumothorax with prolonged air leak. Patient was discharged home with chest tube in place to a mini express. He has no postsurgical pain. He denies shortness of breath. He is on home oxygen. A home health nurses taking care of the chest tube.there is been some xanthochromic fluid drainage from the chest tube. No evidence of infection. No fever. No productive cough.  Chest x-ray taken today shows the chest to be good position. There is a small space-pneumothorax at the left costophrenic angle.   Past Medical History  Diagnosis Date  . Smoking history     Past Surgical History  Procedure Laterality Date  . Video assisted thoracoscopy Left 10/29/2014    Procedure: VIDEO ASSISTED THORACOSCOPY;  Surgeon: Ivin Poot, MD;  Location: [place];  Service: Thoracic;  Laterality: Left;  . Stapling of blebs Left 10/29/2014    Procedure: STAPLING OF BLEBS;  Surgeon: Ivin Poot, MD;  Location: [place];  Service: Thoracic;  Laterality: Left;  . Pleuradesis Left 10/29/2014    Procedure: PLEURADESIS;  Surgeon: Ivin Poot, MD;  Location: [place];  Service: Thoracic;  Laterality: Left;    No family history on file.  Social History History  Substance Use Topics  . Smoking status: Former Smoker -- 1.00 packs/day    Types: Cigarettes    Quit date: 09/11/1992  . Smokeless tobacco: Not on file  . Alcohol Use: No    Current Outpatient Prescriptions  Medication Sig Dispense Refill  . albuterol (PROVENTIL HFA;VENTOLIN HFA) 108 (90 BASE) MCG/ACT inhaler Inhale 2 puffs into the lungs every 6 (six) hours as needed for wheezing or shortness of breath.    . budesonide-formoterol  (SYMBICORT) 160-4.5 MCG/ACT inhaler Inhale 2 puffs into the lungs 2 (two) times daily.    . cephALEXin (KEFLEX) 250 MG capsule Take 1 capsule (250 mg total) by mouth 3 (three) times daily. 30 capsule 0  . fluticasone (FLONASE) 50 MCG/ACT nasal spray Place 1 spray into both nostrils as needed for rhinitis. 16 g 2  . guaiFENesin (MUCINEX) 600 MG 12 hr tablet Take 1 tablet (600 mg total) by mouth 2 (two) times daily. 60 tablet 0  . Ipratropium-Albuterol (COMBIVENT IN) Inhale 1 puff into the lungs 4 (four) times daily.    Marland Kitchen lisinopril (PRINIVIL,ZESTRIL) 20 MG tablet Take 10 mg by mouth daily.    Marland Kitchen tiotropium (SPIRIVA) 18 MCG inhalation capsule Place 18 mcg into inhaler and inhale daily.    Marland Kitchen oxyCODONE 10 MG TABS Take 1 tablet (10 mg total) by mouth every 6 (six) hours as needed for severe pain. (Patient not taking: Reported on 11/18/2014) 50 tablet 0   No current facility-administered medications for this visit.    No Known Allergies  Review of [employer]. Improving his overall strength. Denies shortness of breath or fever.  BP 127/71 mmHg  Pulse 100  Resp 18  Ht 5\' 7"  (1.702 m)  Wt 138 lb (62.596 kg)  BMI 21.61 kg/m2  SpO2 90% Physical Exam Alert and comfortable  Breath sounds distant but clear bilaterally Left VATS incision is well-healed Left chest tube site clean and dry connections intact. Positive airleak through chest tube into many expressed  chamber Heart rate regular No peripheral edema Neuro intact  Diagnostic Tests: Chest x-ray shows chest tube in good position, small basilar pneumothorax at the costophrenic angle  Impression: Persistent airleak following VATS and blood resection. We'll continue many express-chest tube system at home  Plan: Return with chest x-ray in 2 weeks for reassessment  Len Childs, MD [facility] [phone]

## 2014-11-30 ENCOUNTER — Other Ambulatory Visit: Payer: Self-pay | Admitting: Cardiothoracic Surgery

## 2014-11-30 DIAGNOSIS — J439 Emphysema, unspecified: Secondary | ICD-10-CM

## 2014-12-02 ENCOUNTER — Encounter: Payer: Self-pay | Admitting: Cardiothoracic Surgery

## 2014-12-02 ENCOUNTER — Ambulatory Visit
Admission: RE | Admit: 2014-12-02 | Discharge: 2014-12-02 | Disposition: A | Discharge status: Home / Self Care | Payer: Non-veteran care | Source: Ambulatory Visit | Attending: Cardiothoracic Surgery | Admitting: Cardiothoracic Surgery

## 2014-12-02 ENCOUNTER — Ambulatory Visit (INDEPENDENT_AMBULATORY_CARE_PROVIDER_SITE_OTHER): Payer: Non-veteran care | Admitting: Cardiothoracic Surgery

## 2014-12-02 VITALS — BP 157/78 | HR 108 | Resp 20 | Ht 67.0 in | Wt 137.0 lb

## 2014-12-02 DIAGNOSIS — J439 Emphysema, unspecified: Secondary | ICD-10-CM

## 2014-12-02 DIAGNOSIS — IMO0002 Reserved for concepts with insufficient information to code with codable children: Secondary | ICD-10-CM

## 2014-12-02 DIAGNOSIS — J9382 Other air leak: Secondary | ICD-10-CM

## 2014-12-02 DIAGNOSIS — J449 Chronic obstructive pulmonary disease, unspecified: Secondary | ICD-10-CM

## 2014-12-02 DIAGNOSIS — J939 Pneumothorax, unspecified: Secondary | ICD-10-CM

## 2014-12-02 NOTE — Progress Notes (Signed)
PCP is No primary care provider on file. Referring Provider is Wynetta Emery, PA-C  Chief Complaint  Patient presents with  . Routine Post Op    2 week f/u with CXR    [id number] routine followup after undergoing left VATS with stapling of blebs for large recurrent pneumothorax. The patient had a persistent air leak through the final remaining chest tube and was sent home with a chest tube in place on a portable Pleur-evac. He returns now. He has had no air or fluid drainage from the tube in several days. It has appeared to have resolved the airleak. He is breathing comfortably. No drainage or fever. The VATS incisions are well-healed.   Past Medical History  Diagnosis Date  . Smoking history     Past Surgical History  Procedure Laterality Date  . Video assisted thoracoscopy Left 10/29/2014    Procedure: VIDEO ASSISTED THORACOSCOPY;  Surgeon: Ivin Poot, MD;  Location: [place];  Service: Thoracic;  Laterality: Left;  . Stapling of blebs Left 10/29/2014    Procedure: STAPLING OF BLEBS;  Surgeon: Ivin Poot, MD;  Location: [place];  Service: Thoracic;  Laterality: Left;  . Pleuradesis Left 10/29/2014    Procedure: PLEURADESIS;  Surgeon: Ivin Poot, MD;  Location: [place];  Service: Thoracic;  Laterality: Left;    No family history on file.  Social History History  Substance Use Topics  . Smoking status: Former Smoker -- 1.00 packs/day    Types: Cigarettes    Quit date: 09/11/1992  . Smokeless tobacco: Not on file  . Alcohol Use: No    Current Outpatient Prescriptions  Medication Sig Dispense Refill  . albuterol (PROVENTIL HFA;VENTOLIN HFA) 108 (90 BASE) MCG/ACT inhaler Inhale 2 puffs into the lungs every 6 (six) hours as needed for wheezing or shortness of breath.    . budesonide-formoterol (SYMBICORT) 160-4.5 MCG/ACT inhaler Inhale 2 puffs into the lungs 2 (two) times daily.    . fluticasone (FLONASE) 50 MCG/ACT nasal spray Place 1 spray into both nostrils as  needed for rhinitis. 16 g 2  . Ipratropium-Albuterol (COMBIVENT IN) Inhale 1 puff into the lungs 4 (four) times daily.    Marland Kitchen lisinopril (PRINIVIL,ZESTRIL) 20 MG tablet Take 10 mg by mouth daily.    Marland Kitchen tiotropium (SPIRIVA) 18 MCG inhalation capsule Place 18 mcg into inhaler and inhale daily.     No current facility-administered medications for this visit.    No Known Allergies  Review of Systems  Doing well with improved strength Remains on home oxygen Breathing comfortably at rest BP 157/78 mmHg  Pulse 108  Resp 20  Ht 5\' 7"  (1.702 m)  Wt 137 lb (62.143 kg)  BMI 21.45 kg/m2  SpO2 92% Physical Exam Alert and comfortable Breath sounds clear VATS incision is well-healed No air leak from chest tube  The chest tube sutures removed and the chest tube was removed in the office and covered with an airtight dressing with Vaseline gauze over the exit wound. This will be left in place for 40 hours and then the family will change dressing every 48 hours for the next week.  Diagnostic Tests: Chest x-ray shows no pneumothorax  Impression: Resolved airleak. Chest tubes were removed  Plan: Keep airtight dressing on chest tube exit site for the next week Return with chest x-ray in 3 weeks  Len Childs, MD [facility] [phone]

## 2014-12-22 ENCOUNTER — Other Ambulatory Visit: Payer: Self-pay | Admitting: Cardiothoracic Surgery

## 2014-12-22 DIAGNOSIS — J439 Emphysema, unspecified: Secondary | ICD-10-CM

## 2014-12-23 ENCOUNTER — Ambulatory Visit: Payer: Self-pay | Admitting: Cardiothoracic Surgery

## 2014-12-31 ENCOUNTER — Ambulatory Visit: Payer: Non-veteran care | Admitting: Cardiothoracic Surgery

## 2015-01-04 ENCOUNTER — Ambulatory Visit
Admission: RE | Admit: 2015-01-04 | Discharge: 2015-01-04 | Disposition: A | Discharge status: Home / Self Care | Payer: Non-veteran care | Source: Ambulatory Visit | Attending: Cardiothoracic Surgery | Admitting: Cardiothoracic Surgery

## 2015-01-04 ENCOUNTER — Ambulatory Visit (INDEPENDENT_AMBULATORY_CARE_PROVIDER_SITE_OTHER): Payer: Self-pay | Admitting: Surgical

## 2015-01-04 VITALS — BP 145/80 | HR 110 | Resp 20 | Ht 67.0 in | Wt 136.0 lb

## 2015-01-04 DIAGNOSIS — J939 Pneumothorax, unspecified: Secondary | ICD-10-CM

## 2015-01-04 DIAGNOSIS — IMO0002 Reserved for concepts with insufficient information to code with codable children: Secondary | ICD-10-CM

## 2015-01-04 DIAGNOSIS — J449 Chronic obstructive pulmonary disease, unspecified: Secondary | ICD-10-CM

## 2015-01-04 DIAGNOSIS — J439 Emphysema, unspecified: Secondary | ICD-10-CM

## 2015-01-04 DIAGNOSIS — J9382 Other air leak: Secondary | ICD-10-CM

## 2015-01-04 NOTE — Progress Notes (Signed)
[address]       [place] [address]             [phone]                  Anthonymichael Voges  Medical Record #[id number] Date of Birth: 26-Dec-1945  Referring LP:[id number], Loletha Carrow, PA-C Primary Cardiology: Primary Care:No primary care provider on file.  Chief Complaint:  Follow Up Visit  OPERATIONS: 1. Left VATS (video-assisted thoracoscopic surgery) with resection of  apical and lingular blebs, mechanical pleurodesis. 2. [place] pleurodesis of the left pleural space. 3. 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  SURGEON: Ivin Poot, M.D.  ASSISTANT: John Giovanni, P.A.-C.   History of Present Illness:      The patient is seen in the office on today's date following the above procedure. He appears to be doing quite well and is having no new difficulties. He is on chronic oxygen and feels as though his shortness of breath is stable. He sees pulmonology at the [facility]. He has had no difficulties with her incisions. He denies fevers, chills or other constitutional symptoms.     Zubrod Score: At the time of surgery this patient's most appropriate activity status/level should be described as: '[]'$     0    Normal activity, no symptoms '[x]'$     1    Restricted in physical strenuous activity but ambulatory, able to do out light work '[]'$     2    Ambulatory and capable of self care, unable to do work activities, up and about                 >50 % of waking hours                                                                                   '[]'$     3    Only limited self care, in bed greater than 50% of waking hours '[]'$     4    Completely disabled, no self care, confined to bed or chair '[]'$     5    Moribund  History  Smoking status  . Former Smoker -- 1.00 packs/day  . Types: Cigarettes  . Quit date: 09/11/1992  Smokeless tobacco  . Not on file       No Known Allergies  Current  Outpatient Prescriptions  Medication Sig Dispense Refill  . albuterol (PROVENTIL HFA;VENTOLIN HFA) 108 (90 BASE) MCG/ACT inhaler Inhale 2 puffs into the lungs every 6 (six) hours as needed for wheezing or shortness of breath.    . budesonide-formoterol (SYMBICORT) 160-4.5 MCG/ACT inhaler Inhale 2 puffs into the lungs 2 (two) times daily.    . fluticasone (FLONASE) 50 MCG/ACT nasal spray Place 1 spray into both nostrils as needed for rhinitis. 16 g 2  . lisinopril (PRINIVIL,ZESTRIL) 20 MG tablet Take 10 mg by mouth daily.    Marland Kitchen tiotropium (SPIRIVA) 18 MCG inhalation capsule Place 18 mcg into inhaler and inhale daily.     No current facility-administered medications for this visit.  Physical Exam: BP 145/80 mmHg  Pulse 110  Resp 20  Ht '5\' 7"'$  (1.702 m)  Wt 136 lb (61.689 kg)  BMI 21.30 kg/m2  SpO2 94%  General appearance: alert, cooperative and no distress Heart: regular rate and rhythm Lungs: clear to auscultation bilaterally Wound: Healing well   Diagnostic Studies & Laboratory data:         Recent Radiology Findings: Dg Chest 2 View  01/04/2015   CLINICAL DATA:  Known bullous emphysema, history of previous left-sided pneumothorax; history of tobacco use, disc discontinued in 1994.  EXAM: CHEST  2 VIEW  COMPARISON:  PA and lateral chest x-ray of December 02, 2014.  FINDINGS: The lungs are hyperinflated. There is patchy increased density at the left lung base which is not entirely new. A small amount of pleural fluid is present at the left base. The heart and pulmonary vascularity are normal. The mediastinum is normal in width. There is no pneumothorax. The bony thorax is unremarkable.  IMPRESSION: There are bullous emphysematous changes as well as basilar fibrosis and trace of pleural fluid or pleural thickening on the left. There is no evidence of a pneumothorax nor of acute pneumonia.   Electronically Signed   By: David  Martinique M.D.   On: 01/04/2015 15:06      I have  independently reviewed the above radiology findings and reviewed findings  with the patient.  Recent Labs: Lab Results  Component Value Date   WBC 13.4* 11/06/2014   HGB 9.8* 11/06/2014   HCT 30.1* 11/06/2014   PLT 323 11/06/2014   GLUCOSE 107* 11/03/2014   ALT 17 10/31/2014   AST 27 10/31/2014   NA 136 11/03/2014   K 3.8 11/03/2014   CL 102 11/03/2014   CREATININE 0.88 11/03/2014   BUN 19 11/03/2014   CO2 30 11/03/2014   INR 1.12 10/29/2014   HGBA1C 6.5* 10/28/2014      Assessment / Plan:  The patient is doing well. There is no evidence of recurrent pneumothorax on chest x-ray. 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 He appears stable on his current medication regimen and is followed at the [place]. We will see him again on a when necessary basis for any surgical issues or at request.         Blair Mesina E 01/04/2015 3:45 PM

## 2015-01-04 NOTE — Patient Instructions (Signed)
Continue current management

## 2016-04-21 IMAGING — CR DG CHEST 1V PORT
2 series · 2 of 2 positions shown · non-contrast
Comparison: Portable chest x-ray October 29, 2014

CLINICAL DATA: Status post pleurodesis with resection of blebs on
the left, persistent air leak from chest tube

EXAM:
PORTABLE CHEST - 1 VIEW

[portable (1 of 2)]
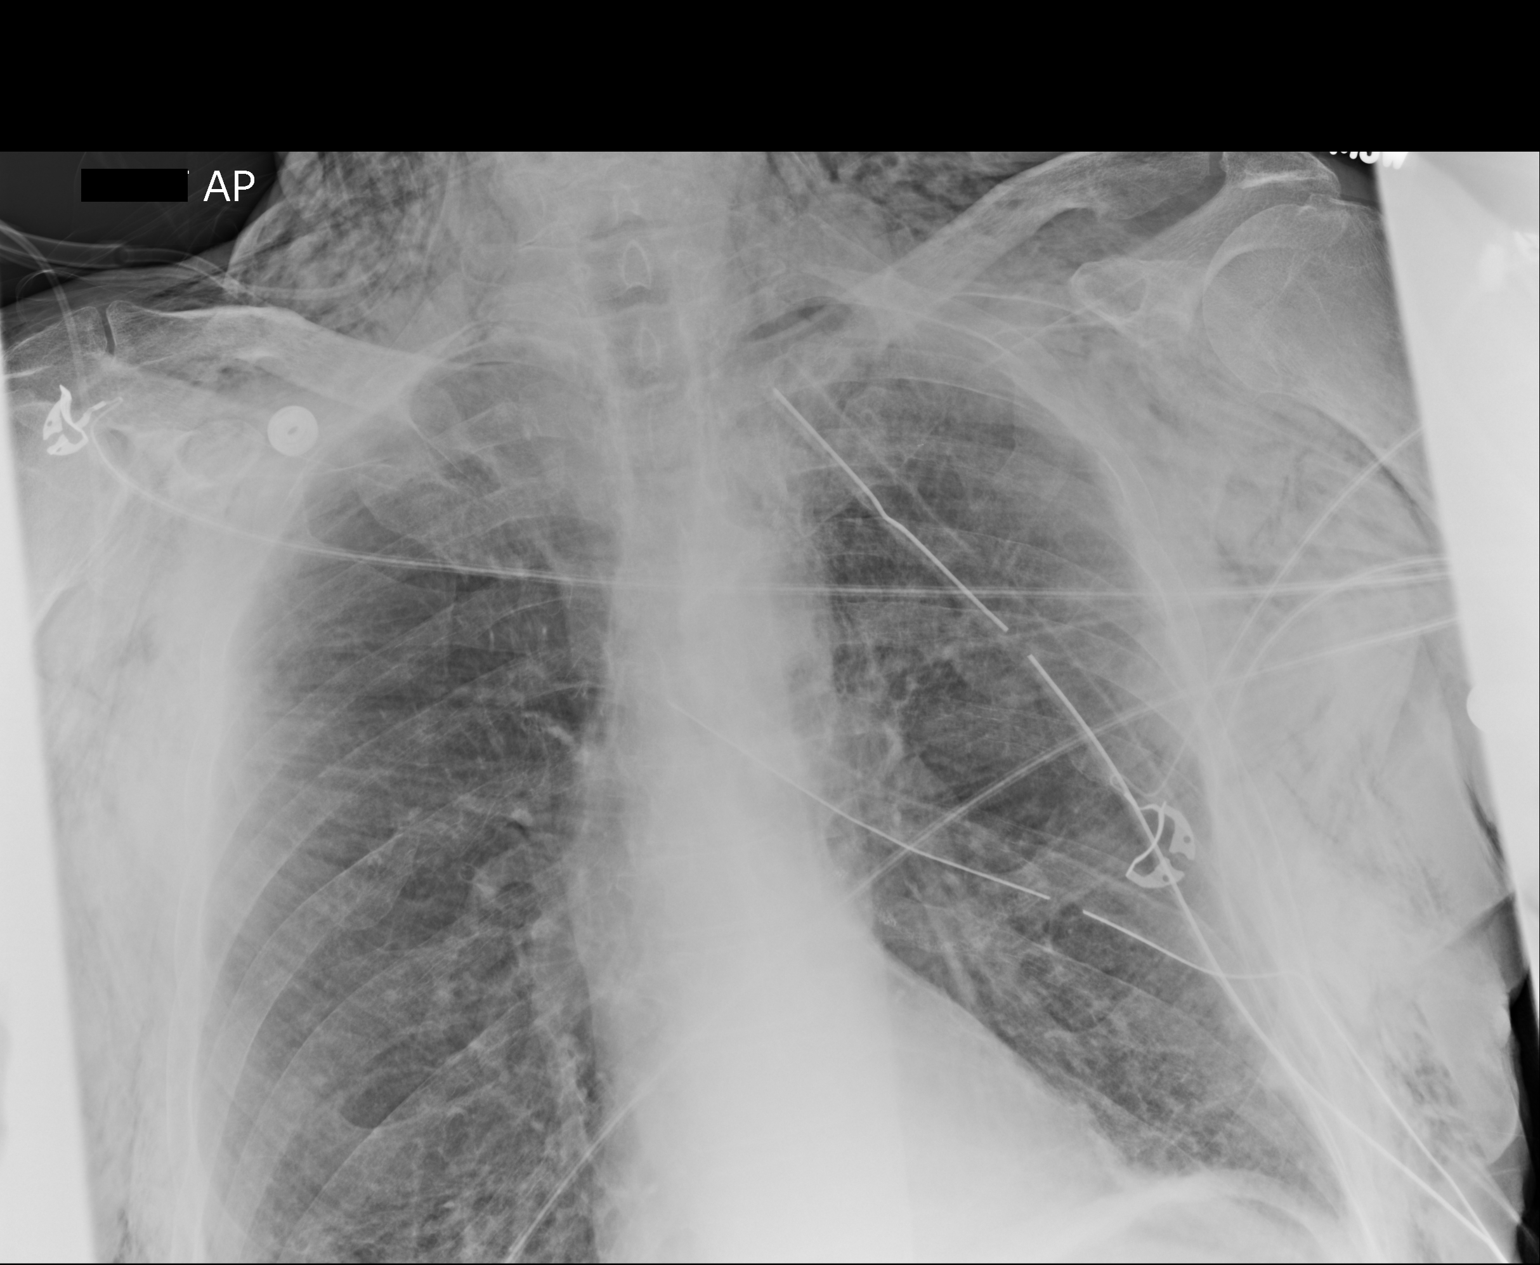

[portable (2 of 2)]
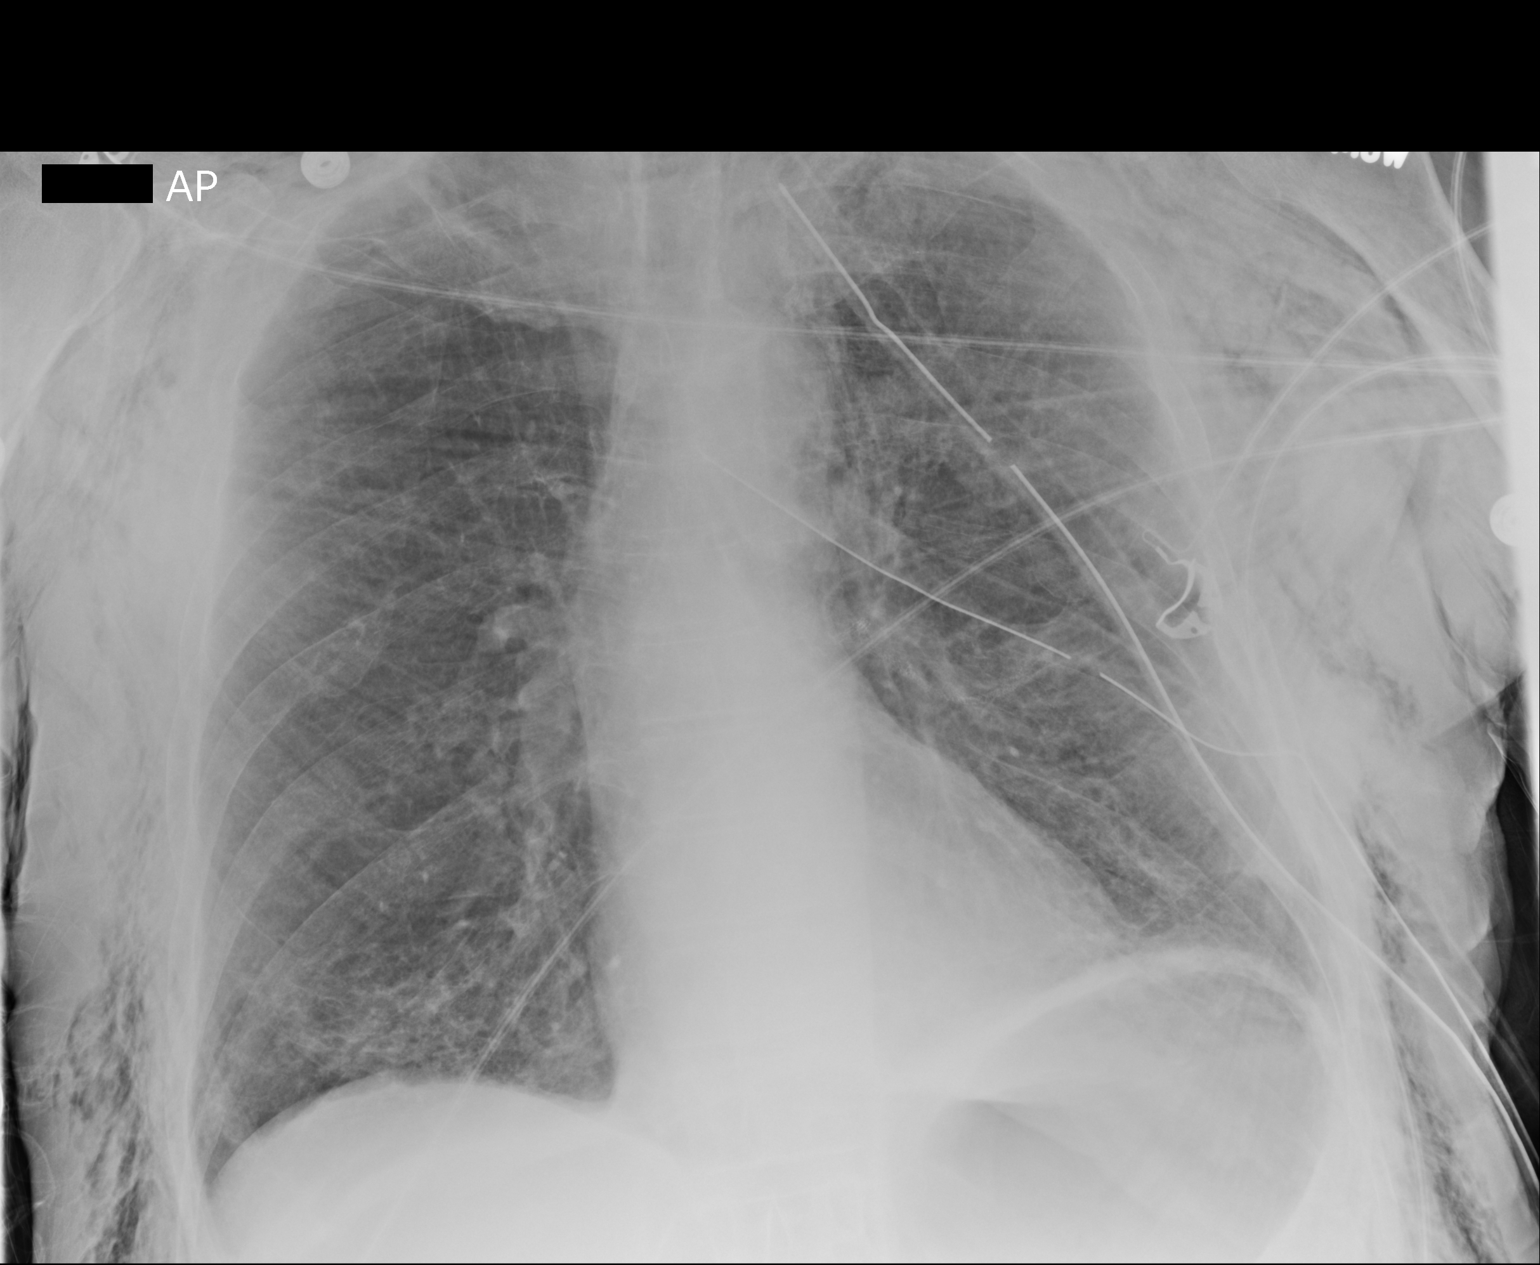

[2 of 2 positions shown; findings below may reference images not displayed]

FINDINGS: The lungs are well-expanded. Extensive subcutaneous air remains
present bilaterally. No pneumothorax is evident. Two left-sided
chest tubes are unchanged in position. There is no alveolar
infiltrate. The heart and pulmonary vascularity are normal. The
observed bony thorax is unremarkable.
IMPRESSION: Slight interval decrease in extensive subcutaneous emphysema. No
pneumothorax is visible. The left-sided chest tubes are unchanged in
position.

## 2016-04-23 IMAGING — CR DG CHEST 1V PORT
1 series · 1 of 1 positions shown · non-contrast
Comparison: 10/31/2014.

CLINICAL DATA: Postop bleb stapling

EXAM:
PORTABLE CHEST - 1 VIEW

[AP]
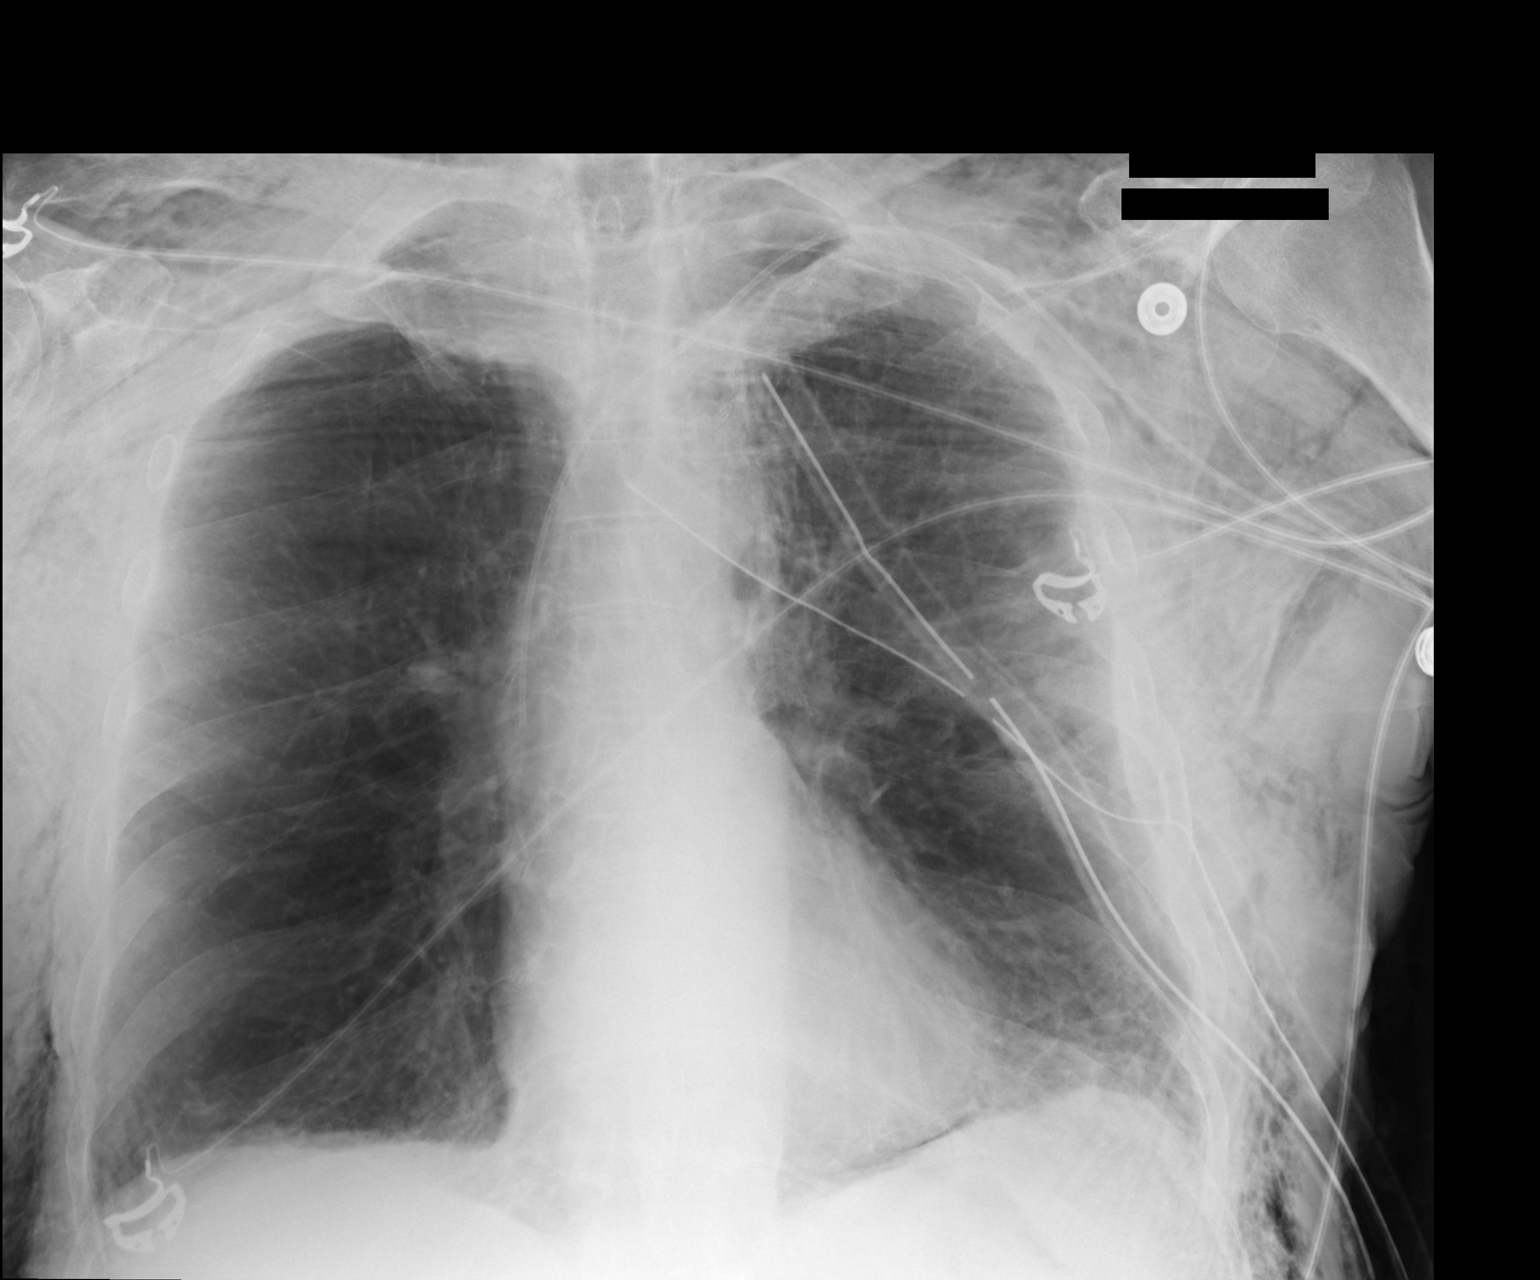

[1 of 1 positions shown; findings below may reference images not displayed]

FINDINGS: 2 left chest tubes are again evident. There is a left upper
extremity PICC line with tip in the SVC. Subcutaneous emphysema
present throughout the thorax. No pneumothorax is evident. Minimal
unchanged basilar opacities, left greater than right
IMPRESSION: No pneumothorax. Minimal basilar atelectatic changes without
significant interval change.

## 2016-04-26 IMAGING — DX DG CHEST 2V
2 series · 2 of 2 positions shown · non-contrast
Comparison: Portable chest x-ray of 11/03/2014

CLINICAL DATA: Followup chest tube placement, VA TS

EXAM:
CHEST  2 VIEW

[chest pa]
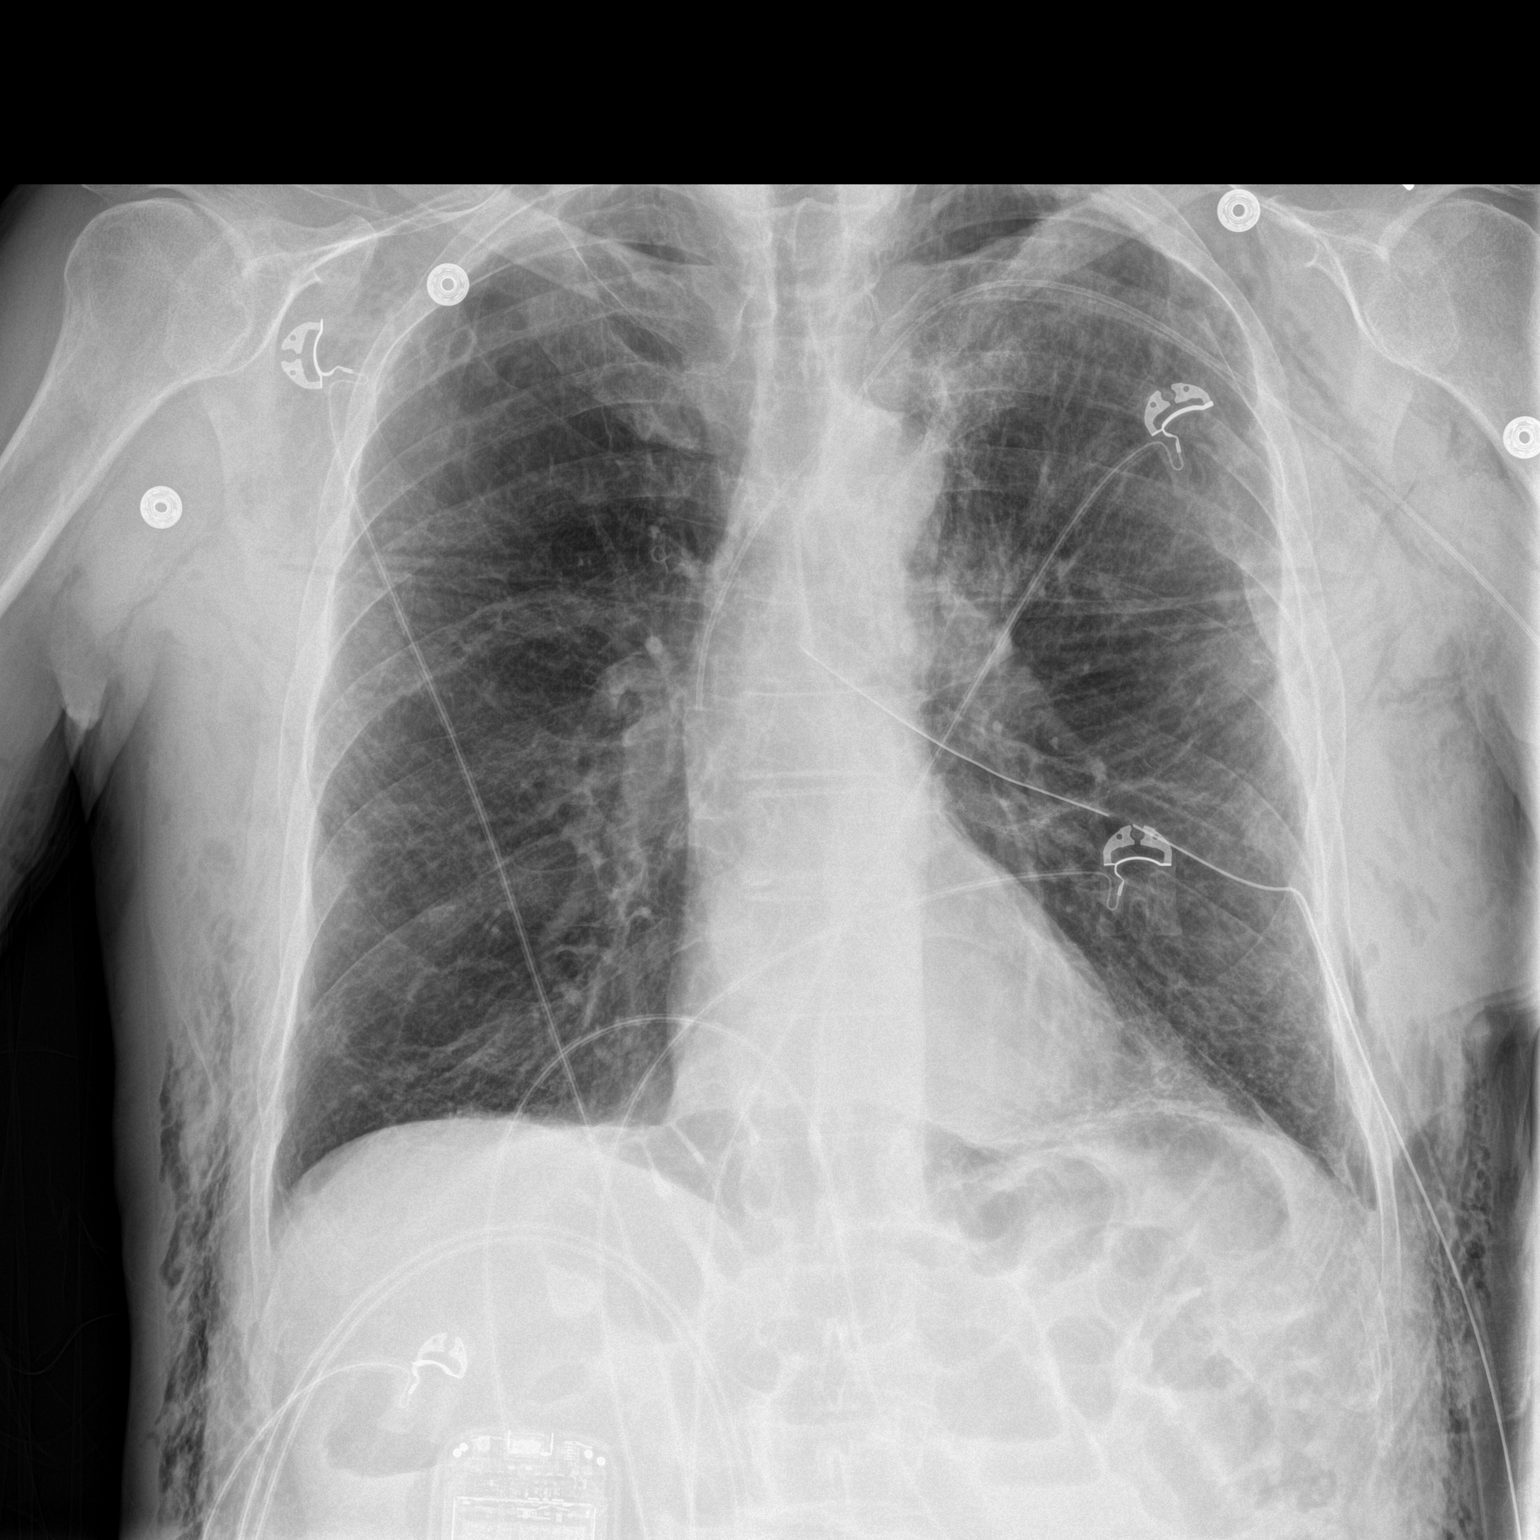

[chest lat]
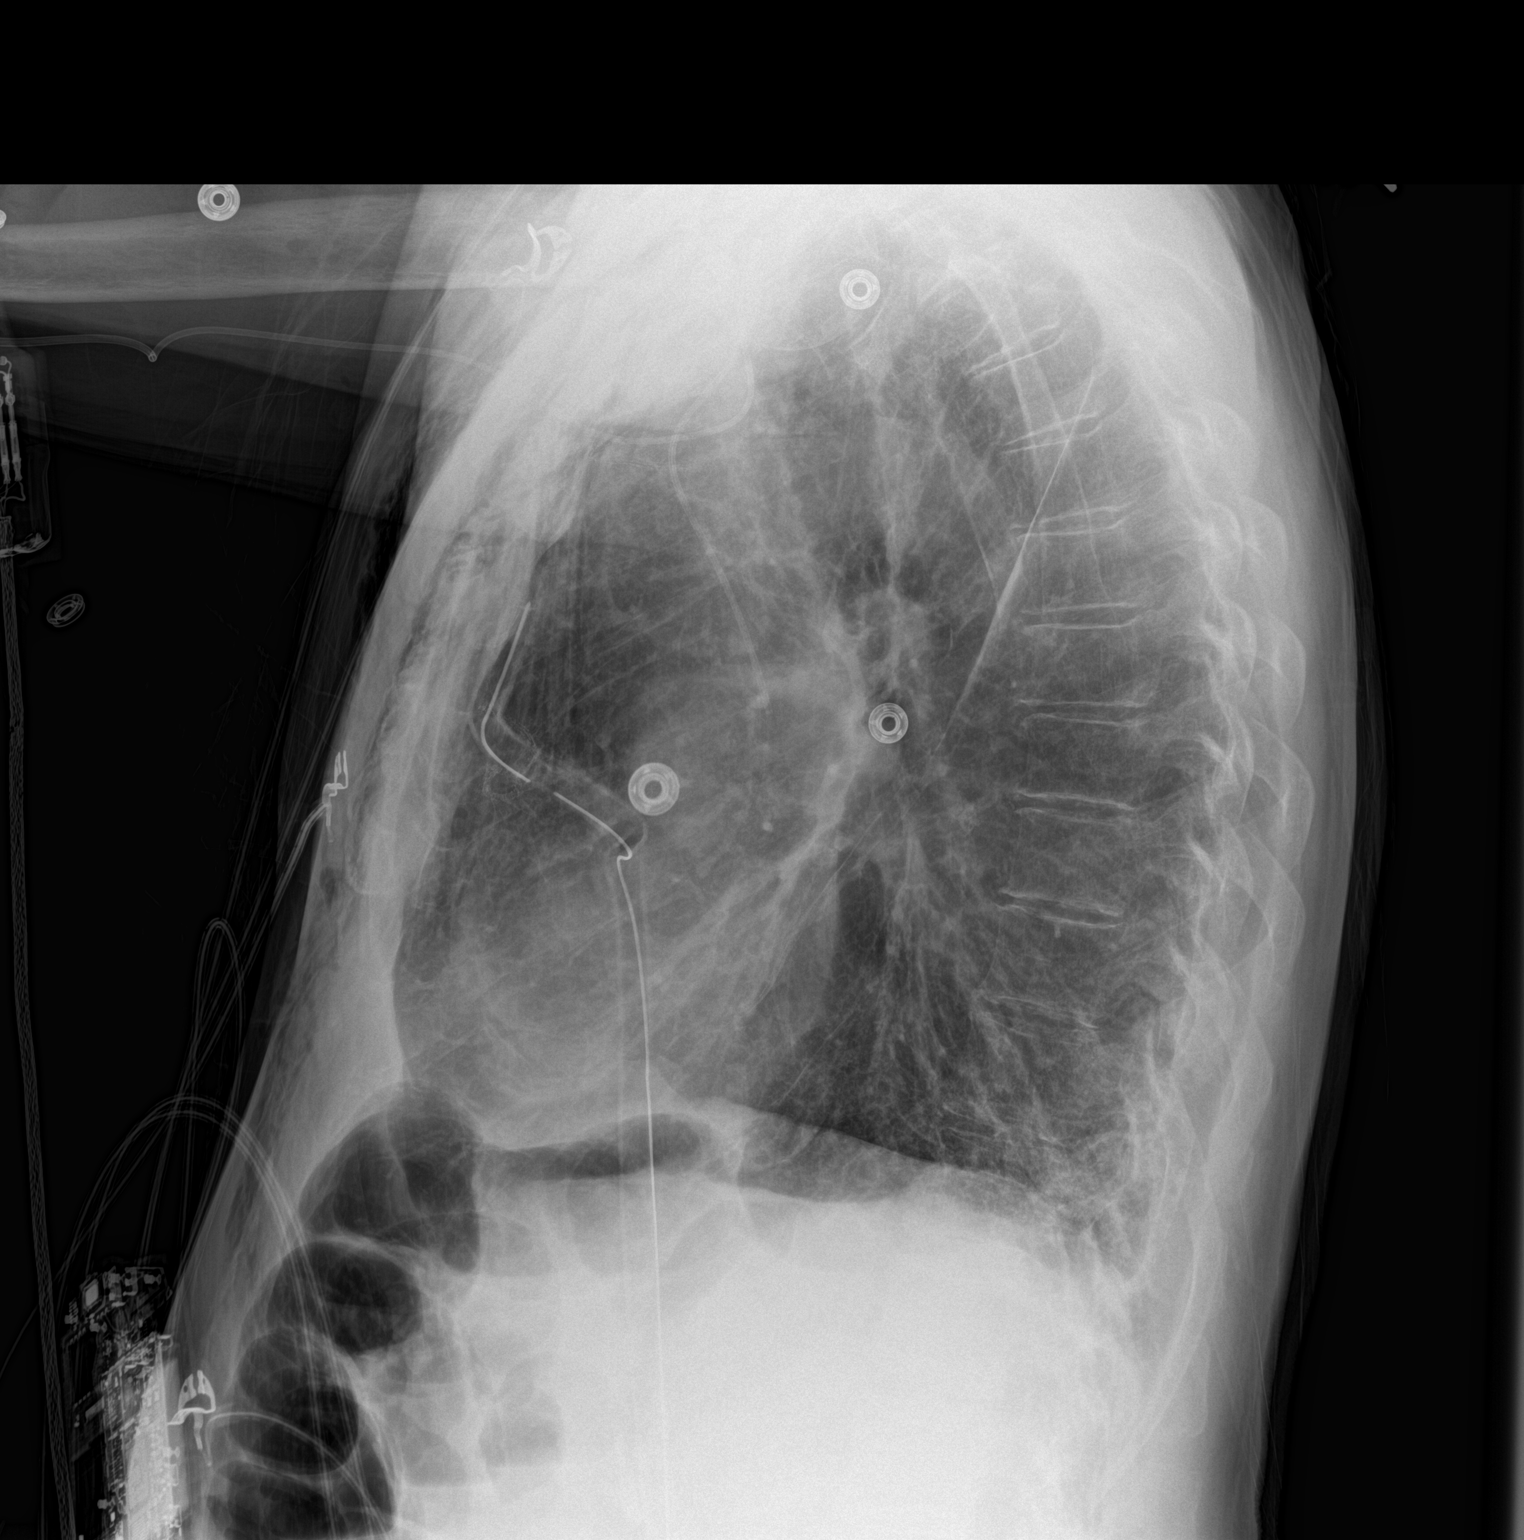

[2 of 2 positions shown; findings below may reference images not displayed]

FINDINGS: There is little change in the small left apical pneumothorax with a
single left chest tube remaining. The tip of the left tube is rather
medial in position. Subcutaneous chest wall emphysema remains
left-greater-than-right. A left PICC line tip overlies the mid SVC.
The right lung is clear. Heart size is stable.
IMPRESSION: No change in small left apical pneumothorax with left chest tube
remaining.

## 2019-02-22 ENCOUNTER — Encounter (HOSPITAL_COMMUNITY): Payer: Self-pay

## 2019-02-22 ENCOUNTER — Emergency Department (HOSPITAL_COMMUNITY)
Admission: EM | Admit: 2019-02-22 | Discharge: 2019-03-01 | Disposition: A | Discharge status: Other Acute Inpatient Hospital | Payer: No Typology Code available for payment source | Attending: Emergency Medicine | Admitting: Emergency Medicine

## 2019-02-22 ENCOUNTER — Other Ambulatory Visit: Payer: Self-pay

## 2019-02-22 DIAGNOSIS — Z87891 Personal history of nicotine dependence: Secondary | ICD-10-CM | POA: Diagnosis not present

## 2019-02-22 DIAGNOSIS — F0281 Dementia in other diseases classified elsewhere with behavioral disturbance: Secondary | ICD-10-CM | POA: Diagnosis not present

## 2019-02-22 DIAGNOSIS — G3184 Mild cognitive impairment, so stated: Secondary | ICD-10-CM | POA: Diagnosis not present

## 2019-02-22 DIAGNOSIS — R4189 Other symptoms and signs involving cognitive functions and awareness: Secondary | ICD-10-CM | POA: Diagnosis present

## 2019-02-22 DIAGNOSIS — F22 Delusional disorders: Secondary | ICD-10-CM | POA: Insufficient documentation

## 2019-02-22 DIAGNOSIS — Z03818 Encounter for observation for suspected exposure to other biological agents ruled out: Secondary | ICD-10-CM | POA: Insufficient documentation

## 2019-02-22 DIAGNOSIS — F0391 Unspecified dementia with behavioral disturbance: Secondary | ICD-10-CM | POA: Diagnosis not present

## 2019-02-22 DIAGNOSIS — F99 Mental disorder, not otherwise specified: Secondary | ICD-10-CM | POA: Diagnosis present
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
## 2019-02-22 NOTE — ED Triage Notes (Signed)
Wears 1L [place] chronically. escorted by [employer]
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
## 2019-02-23 LAB — COMPREHENSIVE METABOLIC PANEL
ALT: 17 U/L (ref 0–44)
AST: 22 U/L (ref 15–41)
Albumin: 3.9 g/dL (ref 3.5–5.0)
Alkaline Phosphatase: 62 U/L (ref 38–126)
Anion gap: 11 (ref 5–15)
BUN: 19 mg/dL (ref 8–23)
CO2: 27 mmol/L (ref 22–32)
Calcium: 8.7 mg/dL — ABNORMAL LOW (ref 8.9–10.3)
Chloride: 106 mmol/L (ref 98–111)
Creatinine, Ser: 1.11 mg/dL (ref 0.61–1.24)
GFR calc Af Amer: >60 mL/min (ref >60)
GFR calc non Af Amer: >60 mL/min (ref >60)
Glucose, Bld: 117 mg/dL — ABNORMAL HIGH (ref 70–99)
Potassium: 3.8 mmol/L (ref 3.5–5.1)
Sodium: 144 mmol/L (ref 135–145)
Total Bilirubin: 1.1 mg/dL (ref 0.3–1.2)
Total Protein: 7.1 g/dL (ref 6.5–8.1)

## 2019-02-23 LAB — CBC WITH DIFFERENTIAL/PLATELET
Abs Immature Granulocytes: 0.04 10*3/uL (ref 0.00–0.07)
Basophils Absolute: 0.1 10*3/uL (ref 0.0–0.1)
Basophils Relative: 0 %
Eosinophils Absolute: 0.3 10*3/uL (ref 0.0–0.5)
Eosinophils Relative: 2 %
HCT: 44.5 % (ref 39.0–52.0)
Hemoglobin: 14.2 g/dL (ref 13.0–17.0)
Immature Granulocytes: 0 %
Lymphocytes Relative: 13 %
Lymphs Abs: 1.7 10*3/uL (ref 0.7–4.0)
MCH: 30.3 pg (ref 26.0–34.0)
MCHC: 31.9 g/dL (ref 30.0–36.0)
MCV: 94.9 fL (ref 80.0–100.0)
Monocytes Absolute: 0.6 10*3/uL (ref 0.1–1.0)
Monocytes Relative: 5 %
Neutro Abs: 10.9 10*3/uL — ABNORMAL HIGH (ref 1.7–7.7)
Neutrophils Relative %: 80 %
Platelets: 346 10*3/uL (ref 150–400)
RBC: 4.69 MIL/uL (ref 4.22–5.81)
RDW: 13.9 % (ref 11.5–15.5)
WBC: 13.5 10*3/uL — ABNORMAL HIGH (ref 4.0–10.5)
nRBC: 0 % (ref 0.0–0.2)

## 2019-02-23 LAB — RAPID URINE DRUG SCREEN, HOSP PERFORMED
Amphetamines: NOT DETECTED
Barbiturates: NOT DETECTED
Benzodiazepines: NOT DETECTED
Cocaine: NOT DETECTED
Opiates: NOT DETECTED
Tetrahydrocannabinol: NOT DETECTED

## 2019-02-23 LAB — URINALYSIS, ROUTINE W REFLEX MICROSCOPIC
Bilirubin Urine: NEGATIVE
Glucose, UA: NEGATIVE mg/dL
Ketones, ur: 20 mg/dL — AB
Leukocytes,Ua: NEGATIVE
Nitrite: NEGATIVE
Protein, ur: 30 mg/dL — AB
Specific Gravity, Urine: 1.028 (ref 1.005–1.030)
pH: 5 (ref 5.0–8.0)

## 2019-02-23 LAB — ACETAMINOPHEN LEVEL: Acetaminophen (Tylenol), Serum: <10 ug/mL — ABNORMAL LOW (ref 10–30)

## 2019-02-23 LAB — ETHANOL: Alcohol, Ethyl (B): <10 mg/dL (ref <10)

## 2019-02-23 LAB — SALICYLATE LEVEL: Salicylate Lvl: <7 mg/dL (ref 2.8–30.0)

## 2019-02-23 MED ORDER — ALUM & MAG HYDROXIDE-SIMETH 200-200-20 MG/5ML PO SUSP: 30.0000 mL | Freq: Four times a day (QID) | ORAL | Status: DC | PRN

## 2019-02-23 MED ORDER — TIOTROPIUM BROMIDE MONOHYDRATE 18 MCG IN CAPS: 18.0000 ug | ORAL_CAPSULE | Freq: Every day | RESPIRATORY_TRACT | Status: DC

## 2019-02-23 MED ORDER — ACETAMINOPHEN 325 MG PO TABS: 650.0000 mg | ORAL_TABLET | ORAL | Status: DC | PRN

## 2019-02-23 MED ORDER — ALBUTEROL SULFATE (2.5 MG/3ML) 0.083% IN NEBU
2.5000 mg | INHALATION_SOLUTION | Freq: Once | RESPIRATORY_TRACT | Status: AC
  Administered 2019-02-23: 23:00:00 2.5 mg via RESPIRATORY_TRACT
  Filled 2019-02-23: qty 3

## 2019-02-23 MED ORDER — LISINOPRIL 10 MG PO TABS
10.0000 mg | ORAL_TABLET | Freq: Every day | ORAL | Status: DC
  Administered 2019-02-24 – 2019-03-01 (×6): 10 mg via ORAL
  Filled 2019-02-23 (×6): qty 1

## 2019-02-23 MED ORDER — ALBUTEROL SULFATE HFA 108 (90 BASE) MCG/ACT IN AERS: 2.0000 | INHALATION_SPRAY | Freq: Four times a day (QID) | RESPIRATORY_TRACT | Status: DC | PRN

## 2019-02-23 MED ORDER — ZIPRASIDONE HCL 20 MG PO CAPS
20.0000 mg | ORAL_CAPSULE | Freq: Two times a day (BID) | ORAL | Status: DC | PRN
  Administered 2019-02-26: 22:00:00 20 mg via ORAL
  Filled 2019-02-23: qty 1

## 2019-02-23 MED ORDER — MOMETASONE FURO-FORMOTEROL FUM 200-5 MCG/ACT IN AERO
2.0000 | INHALATION_SPRAY | Freq: Two times a day (BID) | RESPIRATORY_TRACT | Status: DC
  Administered 2019-02-23 – 2019-02-28 (×11): 2 via RESPIRATORY_TRACT
  Filled 2019-02-23 (×2): qty 8.8

## 2019-02-23 MED ORDER — UMECLIDINIUM BROMIDE 62.5 MCG/INH IN AEPB
1.0000 | INHALATION_SPRAY | Freq: Every day | RESPIRATORY_TRACT | Status: DC
  Administered 2019-02-24 – 2019-02-28 (×5): 1 via RESPIRATORY_TRACT
  Filled 2019-02-23: qty 7

## 2019-02-23 NOTE — ED Notes (Addendum)
Pt becoming agitated and wanting to leave. Is redirectable at this time. Tells me he talked to his daughter who will be coming to get him and "the lady on the screen said I could go home." Moved to a room with less stimulation. Attempted to place pt on wall oxygen as his home concentrator is not working. Pt refused this and put himself back on his empty concentrator. Sitter at bedside and is trying to convince pt to change his clothes, but refuses this as well.
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
## 2019-02-23 NOTE — Progress Notes (Signed)
CSW contacted [facility] regarding bed availability and was informed that referral paperwork would have to be completed and sent over. CSW contacted [facility] and was told that [facility]orth[facility] Rue was the [place] that covers this area so they would need to be called first. CSW contacted [facility] and was informed that they do have beds.    Referral paperwork filled out and faxed over to be signed by physician for referral. Will also need copy of IVC paperwork for referral as well. Staff notified.   Chalmers Guest. Guerry Bruin, MSW, [place] Work/Disposition Phone: [phone] Fax: [phone]

## 2019-02-23 NOTE — ED Notes (Signed)
Patient daughter called and states "he does not do well with ativan."

## 2019-02-23 NOTE — ED Notes (Signed)
Patient refusing all medications offered and ordered by provider.

## 2019-02-23 NOTE — ED Notes (Signed)
Patient refuses to change into burgundy scrubs. Patient is on continuous O2 but refuses to use O2. He states that he gets enough from his portable container that is empty.   When redirecting patient he still refuses to allow use of O2 from facility.

## 2019-02-23 NOTE — BH Assessment (Signed)
[name], pt's daughter, completed pt's IVC.  [phone]

## 2019-02-23 NOTE — ED Notes (Signed)
Will notify daughter of plan at later time this morning.
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
## 2019-02-23 NOTE — ED Notes (Signed)
Sam from TTS called to update status on Pt. Pt meets In-Patient Criteria for Geriatric Pysch Placement.
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
## 2019-02-23 NOTE — ED Notes (Signed)
Spoke with pt's daughter who states pt's live in girlfriend of 43 years left in February and pt has declined significantly since then. Has been living with his daughter x 6 weeks and is now wanting to go home. Has been refusing to go to the doctor for assessment which is why he is here under IVC. No psych history or formal dementia diagnosis.

## 2019-02-24 LAB — SARS CORONAVIRUS 2 BY RT PCR (HOSPITAL ORDER, PERFORMED IN ~~LOC~~ HOSPITAL LAB): SARS Coronavirus 2: NEGATIVE

## 2019-02-24 NOTE — BHH Counselor (Addendum)
TTS reassessment: Patient is alert and oriented x 4. He is sitting up in bed. He is calm and cooperative during assessment. Patient states he came to the hospital at his daughter's request. When asked why she wanted him to come he states "we argue a lot." Patient denies SI/HI/AVH. Per daughter, whom he lives with, patient is delusional and paranoid and not at baseline. TTS reviewed patient's chart and there is no prior history of Major Frontotemporal Neurocognitive Disorder. 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  In patient treatment continues to be recommended due to collateral information.

## 2019-02-24 NOTE — ED Notes (Signed)
IVC papers faxed to [facility].

## 2019-02-24 NOTE — Progress Notes (Signed)
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   This Probation officer called and spoke with patient's daughter, Natalia Leatherwood.  Ms. Richardson Landry confirmed that patient has never been diagnosed with any disorder that results in dementia as a symptom. She also confirmed he has never been assessed by a neurologist And that there is no medical evidence that patient has this very serious disorder.   ED Provider gave patient an initial diagnosis of Delusions.  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  Areatha Keas. Judi Cong, MSW, [place] Disposition Clinical Social Work [phone] (cell) [phone] (office)

## 2019-02-24 NOTE — ED Notes (Signed)
Pt re-assessed by [facility]

## 2019-02-25 NOTE — ED Notes (Signed)
Patient is resting comfortably. 

## 2019-02-25 NOTE — ED Notes (Signed)
TTS machine at bedside. Patient thinks that he has been in the hospital for a month.

## 2019-02-25 NOTE — BH Assessment (Signed)
[place] Assessment Progress Note    Per Mordecai Maes, NP, patient continues to be recommended for inpatient geriatric psych placement.

## 2019-02-25 NOTE — ED Notes (Signed)
Pt is not having any complaints at this time.
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
## 2019-02-25 NOTE — ED Notes (Signed)
Per [facility], will fax over information to be faxed back to them so then be sent to the [facility] for possible placement.

## 2019-02-25 NOTE — Progress Notes (Signed)
Referral paperwork has been sent to [facility] for review.   Audree Camel, LCSW, [place] Disposition [facility] BHH/TTS [phone]

## 2019-02-26 NOTE — Progress Notes (Signed)
Pt. meets criteria for inpatient treatment per Mordecai Maes,, NP.  No appropriate beds available at [facility]. Referred out to the following hospitals:  [facility] [facility]  Disposition CSW will continue to follow for placement.  Areatha Keas. Judi Cong, MSW, [place] Disposition Clinical Social Work [phone] (cell) [phone] (office)

## 2019-02-26 NOTE — ED Notes (Addendum)
Patient increasingly agitated, Ge[place] given with food.

## 2019-02-26 NOTE — Progress Notes (Signed)
CSW contacted by April Alexander, Transfer Coordinator with the [facility].  Ms. Sheppard Coil states that patient's use of continuous O2 excludes him from being admitted to any [facility] for [place] issues.  CSW will send patient referral information to other gero-psych hospitals as he is assessed to continue to require inpatient treatment.  Areatha Keas. Judi Cong, MSW, [place] Disposition Clinical Social Work [phone] (cell) [phone] (office)
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
## 2019-02-26 NOTE — ED Notes (Signed)
Patient states he takes a "breathing pill" at night. Pt does not have any medications due at this time. Pt seems to be agitated.

## 2019-02-27 NOTE — ED Notes (Signed)
Pt given lunch tray.
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
## 2019-02-27 NOTE — ED Notes (Addendum)
Pt receiving a bedside bath by sitter/NT at this time.

## 2019-02-28 DIAGNOSIS — R4189 Other symptoms and signs involving cognitive functions and awareness: Secondary | ICD-10-CM | POA: Diagnosis not present

## 2019-02-28 DIAGNOSIS — F0391 Unspecified dementia with behavioral disturbance: Secondary | ICD-10-CM

## 2019-02-28 DIAGNOSIS — F22 Delusional disorders: Secondary | ICD-10-CM | POA: Diagnosis not present

## 2019-02-28 DIAGNOSIS — G3184 Mild cognitive impairment, so stated: Secondary | ICD-10-CM | POA: Diagnosis not present

## 2019-02-28 NOTE — ED Notes (Signed)
Pt placed on Telemonitoring trial

## 2019-02-28 NOTE — ED Notes (Signed)
Pt. Is currently talking on the phone, with his own cellphone.

## 2019-02-28 NOTE — ED Notes (Signed)
Pt keys given to patient's grandson, Trudee Grip, in order to prepare residence for patient's arrival.

## 2019-02-28 NOTE — ED Notes (Signed)
Pt. Stated that he got off the phone with a family member, and that they have confirmed that his daughter will pick him up from between 10-11am on June 20th,2020.

## 2019-02-28 NOTE — Discharge Instructions (Signed)
You were seen in the emergency department with paranoid delusional type thinking.  The psychiatry service believes you are safe to go home at this time.  They would like for you to follow-up with a neurologist.  I have provided the contact information for a local neurologist.  Continue your home medications.  Return to the emergency department with any new or suddenly worsening symptoms.
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
## 2019-02-28 NOTE — ED Provider Notes (Addendum)
Blood pressure (!) 130/55, pulse 67, temperature 97.8 F (36.6 C), temperature source Oral, resp. rate 14, SpO2 93 %.  In short, Alan Huff is a 73 y.o. male with a chief complaint of Medical Clearance .  Refer to the original H&P for additional details.  09:25 AM  Spoke with behavioral health this morning who reevaluated the patient.  They feel that his delusional thoughts have resolved.  Patient is alert and oriented.  They do not feel that he meets inpatient criteria any longer.  They plan to discuss discharge planning with his daughter by phone.  Will have the patient follow with neurology for possible early dementia type symptoms.  No medications to be given or changed at discharge.   09:45 AM  Spoke with the patient's daughter by phone.  She is out of town and feels somewhat taken off guard by the sudden change in plan.  Discussed the behavioral health impression and plan as outlined to me. Discussed my re-evaluation of the patient this AM.  Behavioral health has discussed this with the daughter as well. She is frustrated by the sudden change but agrees to plan for discharge. She will check and see if there is family in town who can pick up the patient.  Spoke with case manager who will find resources regarding memory care.  Discussed plan for neurology follow-up.   Margette Fast, MD 02/28/19 7001    Margette Fast, MD 02/28/19 1029

## 2019-02-28 NOTE — ED Notes (Signed)
Rescinded IVC Papers faxed to [facility].
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
## 2019-02-28 NOTE — ED Notes (Signed)
Pt. Talking to TTS, currently.

## 2019-03-01 NOTE — Progress Notes (Signed)
CSW was notified by nurse, Jinny Blossom, RN that SNFs and Memory Care ALF information had not been received. CSW faxed over resources.   Chalmers Guest. Guerry Bruin, MSW, [place] Work/Disposition Phone: [phone] Fax: [phone]

## 2019-03-01 NOTE — ED Provider Notes (Signed)
The patient has been resting quietly through the evening.  Vital signs have remained in the stable range.  Family members are to arrive today between 10:00 and 11:00 AM to pick him up and bring him home.   Noemi Chapel, MD 03/01/19 [phone]

## 2021-03-19 ENCOUNTER — Emergency Department (HOSPITAL_COMMUNITY): Payer: No Typology Code available for payment source

## 2021-03-19 ENCOUNTER — Other Ambulatory Visit: Payer: Self-pay

## 2021-03-19 ENCOUNTER — Inpatient Hospital Stay (HOSPITAL_COMMUNITY)
Admission: EM | Admit: 2021-03-19 | Discharge: 2021-04-11 | DRG: 199 | Disposition: E | Payer: No Typology Code available for payment source | Attending: Internal Medicine | Admitting: Internal Medicine

## 2021-03-19 DIAGNOSIS — J9622 Acute and chronic respiratory failure with hypercapnia: Secondary | ICD-10-CM | POA: Diagnosis present

## 2021-03-19 DIAGNOSIS — J42 Unspecified chronic bronchitis: Secondary | ICD-10-CM | POA: Diagnosis not present

## 2021-03-19 DIAGNOSIS — F39 Unspecified mood [affective] disorder: Secondary | ICD-10-CM | POA: Diagnosis present

## 2021-03-19 DIAGNOSIS — Z7951 Long term (current) use of inhaled steroids: Secondary | ICD-10-CM | POA: Diagnosis not present

## 2021-03-19 DIAGNOSIS — J9611 Chronic respiratory failure with hypoxia: Secondary | ICD-10-CM | POA: Diagnosis present

## 2021-03-19 DIAGNOSIS — J9383 Other pneumothorax: Secondary | ICD-10-CM | POA: Diagnosis present

## 2021-03-19 DIAGNOSIS — R64 Cachexia: Secondary | ICD-10-CM | POA: Diagnosis present

## 2021-03-19 DIAGNOSIS — Z515 Encounter for palliative care: Secondary | ICD-10-CM | POA: Diagnosis not present

## 2021-03-19 DIAGNOSIS — R4189 Other symptoms and signs involving cognitive functions and awareness: Secondary | ICD-10-CM | POA: Diagnosis present

## 2021-03-19 DIAGNOSIS — D638 Anemia in other chronic diseases classified elsewhere: Secondary | ICD-10-CM | POA: Diagnosis present

## 2021-03-19 DIAGNOSIS — Z9981 Dependence on supplemental oxygen: Secondary | ICD-10-CM | POA: Diagnosis not present

## 2021-03-19 DIAGNOSIS — J860 Pyothorax with fistula: Secondary | ICD-10-CM | POA: Diagnosis present

## 2021-03-19 DIAGNOSIS — Z87891 Personal history of nicotine dependence: Secondary | ICD-10-CM | POA: Diagnosis not present

## 2021-03-19 DIAGNOSIS — Z681 Body mass index (BMI) 19 or less, adult: Secondary | ICD-10-CM | POA: Diagnosis not present

## 2021-03-19 DIAGNOSIS — R0603 Acute respiratory distress: Secondary | ICD-10-CM | POA: Diagnosis not present

## 2021-03-19 DIAGNOSIS — J9621 Acute and chronic respiratory failure with hypoxia: Secondary | ICD-10-CM | POA: Diagnosis present

## 2021-03-19 DIAGNOSIS — R7989 Other specified abnormal findings of blood chemistry: Secondary | ICD-10-CM | POA: Diagnosis present

## 2021-03-19 DIAGNOSIS — N400 Enlarged prostate without lower urinary tract symptoms: Secondary | ICD-10-CM | POA: Diagnosis present

## 2021-03-19 DIAGNOSIS — F410 Panic disorder [episodic paroxysmal anxiety] without agoraphobia: Secondary | ICD-10-CM | POA: Diagnosis present

## 2021-03-19 DIAGNOSIS — E872 Acidosis: Secondary | ICD-10-CM | POA: Diagnosis present

## 2021-03-19 DIAGNOSIS — R6510 Systemic inflammatory response syndrome (SIRS) of non-infectious origin without acute organ dysfunction: Secondary | ICD-10-CM | POA: Diagnosis present

## 2021-03-19 DIAGNOSIS — F0390 Unspecified dementia without behavioral disturbance: Secondary | ICD-10-CM | POA: Diagnosis present

## 2021-03-19 DIAGNOSIS — J449 Chronic obstructive pulmonary disease, unspecified: Secondary | ICD-10-CM | POA: Diagnosis present

## 2021-03-19 DIAGNOSIS — Z79899 Other long term (current) drug therapy: Secondary | ICD-10-CM | POA: Diagnosis not present

## 2021-03-19 DIAGNOSIS — J9382 Other air leak: Secondary | ICD-10-CM | POA: Diagnosis present

## 2021-03-19 DIAGNOSIS — F419 Anxiety disorder, unspecified: Secondary | ICD-10-CM | POA: Diagnosis not present

## 2021-03-19 DIAGNOSIS — Z993 Dependence on wheelchair: Secondary | ICD-10-CM | POA: Diagnosis not present

## 2021-03-19 DIAGNOSIS — J939 Pneumothorax, unspecified: Secondary | ICD-10-CM

## 2021-03-19 DIAGNOSIS — R0602 Shortness of breath: Secondary | ICD-10-CM

## 2021-03-19 DIAGNOSIS — E875 Hyperkalemia: Secondary | ICD-10-CM | POA: Diagnosis not present

## 2021-03-19 DIAGNOSIS — J439 Emphysema, unspecified: Secondary | ICD-10-CM | POA: Diagnosis present

## 2021-03-19 DIAGNOSIS — Z20822 Contact with and (suspected) exposure to covid-19: Secondary | ICD-10-CM | POA: Diagnosis present

## 2021-03-19 DIAGNOSIS — Z66 Do not resuscitate: Secondary | ICD-10-CM | POA: Diagnosis present

## 2021-03-19 DIAGNOSIS — F17210 Nicotine dependence, cigarettes, uncomplicated: Secondary | ICD-10-CM | POA: Diagnosis present

## 2021-03-19 DIAGNOSIS — Z09 Encounter for follow-up examination after completed treatment for conditions other than malignant neoplasm: Secondary | ICD-10-CM

## 2021-03-19 DIAGNOSIS — E43 Unspecified severe protein-calorie malnutrition: Secondary | ICD-10-CM | POA: Insufficient documentation

## 2021-03-19 DIAGNOSIS — Z7189 Other specified counseling: Secondary | ICD-10-CM | POA: Diagnosis not present

## 2021-03-19 DIAGNOSIS — I454 Nonspecific intraventricular block: Secondary | ICD-10-CM | POA: Diagnosis present

## 2021-03-19 DIAGNOSIS — E876 Hypokalemia: Secondary | ICD-10-CM | POA: Diagnosis not present

## 2021-03-19 DIAGNOSIS — J441 Chronic obstructive pulmonary disease with (acute) exacerbation: Secondary | ICD-10-CM | POA: Diagnosis not present

## 2021-03-19 DIAGNOSIS — J95812 Postprocedural air leak: Secondary | ICD-10-CM | POA: Diagnosis not present

## 2021-03-19 LAB — CBC WITH DIFFERENTIAL/PLATELET
Abs Immature Granulocytes: 0.11 10*3/uL — ABNORMAL HIGH (ref 0.00–0.07)
Basophils Absolute: 0.1 10*3/uL (ref 0.0–0.1)
Basophils Relative: 0 %
Eosinophils Absolute: 0.6 10*3/uL — ABNORMAL HIGH (ref 0.0–0.5)
Eosinophils Relative: 3 %
HCT: 34.1 % — ABNORMAL LOW (ref 39.0–52.0)
Hemoglobin: 10.7 g/dL — ABNORMAL LOW (ref 13.0–17.0)
Immature Granulocytes: 1 %
Lymphocytes Relative: 31 %
Lymphs Abs: 6.4 10*3/uL — ABNORMAL HIGH (ref 0.7–4.0)
MCH: 31.4 pg (ref 26.0–34.0)
MCHC: 31.4 g/dL (ref 30.0–36.0)
MCV: 100 fL (ref 80.0–100.0)
Monocytes Absolute: 0.9 10*3/uL (ref 0.1–1.0)
Monocytes Relative: 4 %
Neutro Abs: 12.5 10*3/uL — ABNORMAL HIGH (ref 1.7–7.7)
Neutrophils Relative %: 61 %
Platelets: 444 10*3/uL — ABNORMAL HIGH (ref 150–400)
RBC: 3.41 MIL/uL — ABNORMAL LOW (ref 4.22–5.81)
RDW: 15.3 % (ref 11.5–15.5)
WBC: 20.5 10*3/uL — ABNORMAL HIGH (ref 4.0–10.5)
nRBC: 0 % (ref 0.0–0.2)

## 2021-03-19 LAB — COMPREHENSIVE METABOLIC PANEL
ALT: 14 U/L (ref 0–44)
AST: 21 U/L (ref 15–41)
Albumin: 3.8 g/dL (ref 3.5–5.0)
Alkaline Phosphatase: 50 U/L (ref 38–126)
Anion gap: 9 (ref 5–15)
BUN: 30 mg/dL — ABNORMAL HIGH (ref 8–23)
CO2: 31 mmol/L (ref 22–32)
Calcium: 8.8 mg/dL — ABNORMAL LOW (ref 8.9–10.3)
Chloride: 99 mmol/L (ref 98–111)
Creatinine, Ser: 0.87 mg/dL (ref 0.61–1.24)
GFR, Estimated: >60 mL/min (ref >60)
Glucose, Bld: 193 mg/dL — ABNORMAL HIGH (ref 70–99)
Potassium: 3.5 mmol/L (ref 3.5–5.1)
Sodium: 139 mmol/L (ref 135–145)
Total Bilirubin: 0.6 mg/dL (ref 0.3–1.2)
Total Protein: 7.8 g/dL (ref 6.5–8.1)

## 2021-03-19 LAB — APTT: aPTT: 30 seconds (ref 24–36)

## 2021-03-19 LAB — RESP PANEL BY RT-PCR (FLU A&B, COVID) ARPGX2
Influenza A by PCR: NEGATIVE
Influenza B by PCR: NEGATIVE
SARS Coronavirus 2 by RT PCR: NEGATIVE

## 2021-03-19 LAB — BLOOD GAS, ARTERIAL
Acid-Base Excess: 4.4 mmol/L — ABNORMAL HIGH (ref 0.0–2.0)
Bicarbonate: 26.7 mmol/L (ref 20.0–28.0)
FIO2: 80
O2 Saturation: 91.3 %
Patient temperature: 37
pCO2 arterial: 80.2 mmHg (ref 32.0–48.0)
pH, Arterial: 7.22 — ABNORMAL LOW (ref 7.350–7.450)
pO2, Arterial: 74.6 mmHg — ABNORMAL LOW (ref 83.0–108.0)

## 2021-03-19 LAB — PROCALCITONIN: Procalcitonin: 0.26 ng/mL

## 2021-03-19 LAB — LACTIC ACID, PLASMA
Lactic Acid, Venous: 1.7 mmol/L (ref 0.5–1.9)
Lactic Acid, Venous: 2 mmol/L (ref 0.5–1.9)
Lactic Acid, Venous: 2.6 mmol/L (ref 0.5–1.9)
Lactic Acid, Venous: 3.4 mmol/L (ref 0.5–1.9)

## 2021-03-19 LAB — PROTIME-INR
INR: 1 (ref 0.8–1.2)
Prothrombin Time: 12.9 seconds (ref 11.4–15.2)

## 2021-03-19 LAB — CREATININE, SERUM
Creatinine, Ser: 0.95 mg/dL (ref 0.61–1.24)
GFR, Estimated: >60 mL/min (ref >60)

## 2021-03-19 LAB — MRSA NEXT GEN BY PCR, NASAL: MRSA by PCR Next Gen: NOT DETECTED

## 2021-03-19 MED ORDER — ACETAMINOPHEN 650 MG RE SUPP: 650.0000 mg | Freq: Four times a day (QID) | RECTAL | Status: DC | PRN

## 2021-03-19 MED ORDER — VANCOMYCIN HCL 750 MG/150ML IV SOLN
750.0000 mg | INTRAVENOUS | Status: DC
  Administered 2021-03-20: 750 mg via INTRAVENOUS
  Filled 2021-03-19 (×2): qty 150

## 2021-03-19 MED ORDER — MOMETASONE FURO-FORMOTEROL FUM 200-5 MCG/ACT IN AERO
2.0000 | INHALATION_SPRAY | Freq: Two times a day (BID) | RESPIRATORY_TRACT | Status: DC
  Administered 2021-03-20 – 2021-04-03 (×29): 2 via RESPIRATORY_TRACT
  Filled 2021-03-19 (×3): qty 8.8

## 2021-03-19 MED ORDER — SODIUM CHLORIDE 0.9 % IV BOLUS
500.0000 mL | Freq: Once | INTRAVENOUS | Status: AC
  Administered 2021-03-19: 500 mL via INTRAVENOUS

## 2021-03-19 MED ORDER — SODIUM CHLORIDE 0.9 % IV SOLN
2.0000 g | Freq: Two times a day (BID) | INTRAVENOUS | Status: DC
  Administered 2021-03-20 – 2021-03-22 (×5): 2 g via INTRAVENOUS
  Filled 2021-03-19 (×5): qty 2

## 2021-03-19 MED ORDER — VANCOMYCIN HCL IN DEXTROSE 1-5 GM/200ML-% IV SOLN
1000.0000 mg | Freq: Once | INTRAVENOUS | Status: AC
  Administered 2021-03-19: 1000 mg via INTRAVENOUS
  Filled 2021-03-19: qty 200

## 2021-03-19 MED ORDER — CHLORHEXIDINE GLUCONATE CLOTH 2 % EX PADS
6.0000 | MEDICATED_PAD | Freq: Every day | CUTANEOUS | Status: DC
  Administered 2021-03-20 – 2021-03-24 (×5): 6 via TOPICAL

## 2021-03-19 MED ORDER — MORPHINE SULFATE (PF) 2 MG/ML IV SOLN
2.0000 mg | INTRAVENOUS | Status: DC | PRN
Start: 2021-03-19 — End: 2021-03-31
  Administered 2021-03-20 – 2021-03-22 (×5): 2 mg via INTRAVENOUS
  Filled 2021-03-19 (×5): qty 1

## 2021-03-19 MED ORDER — TIOTROPIUM BROMIDE MONOHYDRATE 18 MCG IN CAPS: 18.0000 ug | ORAL_CAPSULE | Freq: Every day | RESPIRATORY_TRACT | Status: DC

## 2021-03-19 MED ORDER — LACTATED RINGERS IV SOLN: INTRAVENOUS | Status: DC

## 2021-03-19 MED ORDER — ACETAMINOPHEN 325 MG PO TABS
650.0000 mg | ORAL_TABLET | Freq: Four times a day (QID) | ORAL | Status: DC | PRN
  Administered 2021-03-24 – 2021-03-27 (×3): 650 mg via ORAL
  Filled 2021-03-19 (×3): qty 2

## 2021-03-19 MED ORDER — ENOXAPARIN SODIUM 40 MG/0.4ML IJ SOSY: 40.0000 mg | PREFILLED_SYRINGE | INTRAMUSCULAR | Status: DC

## 2021-03-19 MED ORDER — LIDOCAINE HCL (PF) 1 % IJ SOLN
INTRAMUSCULAR | Status: AC
  Administered 2021-03-19: 30 mL
  Filled 2021-03-19: qty 30

## 2021-03-19 MED ORDER — POVIDONE-IODINE 10 % EX SOLN
CUTANEOUS | Status: AC
  Filled 2021-03-19: qty 15

## 2021-03-19 MED ORDER — SODIUM CHLORIDE 0.9 % IV SOLN
2.0000 g | Freq: Once | INTRAVENOUS | Status: AC
  Administered 2021-03-19: 2 g via INTRAVENOUS
  Filled 2021-03-19: qty 2

## 2021-03-19 MED ORDER — ALBUTEROL (5 MG/ML) CONTINUOUS INHALATION SOLN: 10.0000 mg/h | INHALATION_SOLUTION | RESPIRATORY_TRACT | Status: DC

## 2021-03-19 MED ORDER — ENSURE ENLIVE PO LIQD
237.0000 mL | Freq: Two times a day (BID) | ORAL | Status: DC
  Administered 2021-03-20 – 2021-03-24 (×9): 237 mL via ORAL

## 2021-03-19 MED ORDER — IPRATROPIUM-ALBUTEROL 0.5-2.5 (3) MG/3ML IN SOLN
3.0000 mL | Freq: Four times a day (QID) | RESPIRATORY_TRACT | Status: DC | PRN
  Administered 2021-03-20 – 2021-03-23 (×3): 3 mL via RESPIRATORY_TRACT
  Filled 2021-03-19 (×4): qty 3

## 2021-03-19 NOTE — Sepsis Progress Note (Signed)
Notified bedside nurse of need to draw repeat lactic acid since the 2nd LA was higher than the first per protocol.
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
## 2021-03-19 NOTE — Progress Notes (Addendum)
Pharmacy Antibiotic Note  Alan Huff is a 75 y.o. male admitted on 03/25/2021 with pneumonia.  Pharmacy has been consulted for Vancomycin and Cefepime dosing.  Plan: Vancomycin 1000 mg IV x 1 dose. Vancomycin 750 mg IV every 24 hours. Cefepime 2000 mg IV every 12 hours. Monitor labs, c/s, and vanco level as indicated.  Height: 5\' 7"  (170.2 cm) Weight: 52.2 kg (115 lb) IBW/kg (Calculated) : 66.1  Temp (24hrs), Avg:99.2 F (37.3 C), Min:99.2 F (37.3 C), Max:99.2 F (37.3 C)  Recent Labs  Lab 04/06/2021 1559  WBC 20.5*  CREATININE 0.87  LATICACIDVEN 1.7    Estimated Creatinine Clearance: 53.3 mL/min (by C-G formula based on SCr of 0.87 mg/dL).    No Known Allergies  Antimicrobials this admission: Vanco 7/9 >> Cefepime 7/9 >>  Microbiology results: 7/9 BCx: pending 7/9 UCx: pending  7/9 MRSA PCR: pending  Thank you for allowing pharmacy to be a part of this patient's care.  Ramond Craver 04/10/2021 4:36 PM

## 2021-03-19 NOTE — ED Notes (Signed)
Date and time results received: 03/28/2021 1642  Test: PCo2  Critical Value: 80.2  Name of Provider Notified: Noemi Chapel, MD  Orders Received? Or Actions Taken?: See new orders

## 2021-03-19 NOTE — H&P (Signed)
TRH H&P   Patient Demographics:    Alan Huff, is a 75 y.o. male  MRN: [id number]   DOB - 12-26-1945  Admit Date - 03/18/2021  Outpatient Primary MD for the [facility] is [facility]  Referring MD/NP/PA: Dr Sabra Heck    Patient coming from: [facility]  Chief Complaint  Patient presents with   Shortness of Breath      HPI:    Alan Huff  is a 75 y.o. male, with past medical history of COPD, lung blebs, chronic respiratory failure on 2 to 4 L nasal cannula, history of pneumothorax, blebs, and bronchoalveolar fistula status post pleurodesis, bleb resection VATS with minithoracotomy in 2016. -Patient is a long-term resident at [facility], he is wheelchair dependent, unable to ambulate for last year and a half, was brought by EMS as he was in significant respiratory distress, severely short of breath, presentation to ED he was saturating in the low 80s, tachypneic, and did tachycardic despite receiving breathing treatments, patient is very poor historian. -In ED chest x-ray significant with right chest pneumothorax, where he required immediate chest tube insertion, where he reports immediate improvement of symptoms,[facility] consulted to admit.   ED physician discussed with CT surgery, daughter reports her wishes she does not want any surgery, and she wants conservative management currently, ED physician discussed with general surgery, they will manage the chest tube till Monday when pulmonary they will be able to assist with chest tube management as well, discussed plan with the daughter, continue with current measures of chest tube management, if no improvement then would consider hospice options.    Review of systems:    Patient is poor historian, unable to obtain appropriate review of systems  With Past History of the following :    Past Medical History:  Diagnosis Date    Smoking history       Past Surgical History:  Procedure Laterality Date   PLEURADESIS Left 10/29/2014   Procedure: PLEURADESIS;  Surgeon: Ivin Poot, MD;  Location: [place];  Service: Thoracic;  Laterality: Left;   STAPLING OF BLEBS Left 10/29/2014   Procedure: STAPLING OF BLEBS;  Surgeon: Ivin Poot, MD;  Location: [place];  Service: Thoracic;  Laterality: Left;   VIDEO ASSISTED THORACOSCOPY Left 10/29/2014   Procedure: VIDEO ASSISTED THORACOSCOPY;  Surgeon: Ivin Poot, MD;  Location: [facility] OR;  Service: Thoracic;  Laterality: Left;      Social History:     Social History   Tobacco Use   Smoking status: Former    Packs/day: 1.00    Pack years: 0.00    Types: Cigarettes    Quit date: 09/11/1992    Years since quitting: 28.5   Smokeless tobacco: Never  Substance Use Topics   Alcohol use: No    Alcohol/week: 0.0 standard drinks        Family History :  Family history was reviewed, nonpertinent   Home Medications:   Prior to Admission medications   Medication Sig Start Date End Date Taking? Authorizing Provider  albuterol (PROVENTIL HFA;VENTOLIN HFA) 108 (90 BASE) MCG/ACT inhaler Inhale 2 puffs into the lungs every 6 (six) hours as needed for wheezing or shortness of breath.    [provider]  budesonide-formoterol (SYMBICORT) 160-4.5 MCG/ACT inhaler Inhale 2 puffs into the lungs 2 (two) times daily.    [provider]  tiotropium (SPIRIVA) 18 MCG inhalation capsule Place 18 mcg into inhaler and inhale daily.    [provider]     Allergies:    No Known Allergies   Physical Exam:   Vitals  Blood pressure 127/83, pulse (!) 113, temperature 99.2 F (37.3 C), temperature source Rectal, resp. rate (!) 25, height 5\' 7"  (1.702 m), weight 52.2 kg, SpO2 100 %.   1. General frail, elderly male, laying in bed, chronically ill-appearing, extremely frail and deconditioned, no apparent distress  2.  Impaired judgment and insight, hard of  hearing, oriented x2   3. No F.N deficits, ALL C.Nerves Intact, Strength 5/5 all 4 extremities, Sensation intact all 4 extremities, Plantars down going.  4. Ears and Eyes appear Normal, Conjunctivae clear, PERRLA. Moist Oral Mucosa.  5. Supple Neck, No JVD, No cervical lymphadenopathy appriciated, No Carotid Bruits.  6. Symmetrical Chest wall movement, chest tube present in right lateral side of the lung, he is with good air entry bilaterally  7. Tachycardic , No Gallops, Rubs or Murmurs, No Parasternal Heave.  8. Positive Bowel Sounds, Abdomen Soft, No tenderness, No organomegaly appriciated,No rebound -guarding or rigidity.  9.  No Cyanosis, Normal Skin Turgor, No Skin Rash or Bruise.  10.  Significant muscle wasting, cachectic, no joint effusions  11. No Palpable Lymph Nodes in Neck or Axillae    Data Review:    CBC Recent Labs  Lab 03/17/2021 1559  WBC 20.5*  HGB 10.7*  HCT 34.1*  PLT 444*  MCV 100.0  MCH 31.4  MCHC 31.4  RDW 15.3  LYMPHSABS 6.4*  MONOABS 0.9  EOSABS 0.6*  BASOSABS 0.1   ------------------------------------------------------------------------------------------------------------------  Chemistries  Recent Labs  Lab 03/17/2021 1559  NA 139  K 3.5  CL 99  CO2 31  GLUCOSE 193*  BUN 30*  CREATININE 0.87  CALCIUM 8.8*  AST 21  ALT 14  ALKPHOS 50  BILITOT 0.6   ------------------------------------------------------------------------------------------------------------------ estimated creatinine clearance is 53.3 mL/min (by C-G formula based on SCr of 0.87 mg/dL). ------------------------------------------------------------------------------------------------------------------ No results for input(s): TSH, T4TOTAL, T3FREE, THYROIDAB in the last 72 hours.  Invalid input(s): FREET3  Coagulation profile Recent Labs  Lab 04/08/2021 1559  INR 1.0    ------------------------------------------------------------------------------------------------------------------- No results for input(s): DDIMER in the last 72 hours. -------------------------------------------------------------------------------------------------------------------  Cardiac Enzymes No results for input(s): CKMB, TROPONINI, MYOGLOBIN in the last 168 hours.  Invalid input(s): CK ------------------------------------------------------------------------------------------------------------------ No results found for: BNP   ---------------------------------------------------------------------------------------------------------------  Urinalysis    Component Value Date/Time   COLORURINE AMBER (A) 02/23/2019 0929   APPEARANCEUR HAZY (A) 02/23/2019 0929   LABSPEC 1.028 02/23/2019 0929   PHURINE 5.0 02/23/2019 0929   GLUCOSEU NEGATIVE 02/23/2019 0929   HGBUR SMALL (A) 02/23/2019 0929   BILIRUBINUR NEGATIVE 02/23/2019 0929   KETONESUR 20 (A) 02/23/2019 0929   PROTEINUR 30 (A) 02/23/2019 0929   UROBILINOGEN 0.2 11/04/2014 1239   NITRITE NEGATIVE 02/23/2019 0929   LEUKOCYTESUR NEGATIVE 02/23/2019 0929    ----------------------------------------------------------------------------------------------------------------   Imaging Results:  DG Chest Port 1 View  Result Date: 04/07/2021 CLINICAL DATA:  Questionable sepsis.  Shortness of breath EXAM: PORTABLE CHEST 1 VIEW COMPARISON:  12/25/2014 FINDINGS: There is a large right pneumothorax, approximately 50%. There is hyperinflation of the lungs compatible with COPD. No confluent opacity on the left. Heart is normal size. Aortic calcifications. IMPRESSION: Emphysema. Large right pneumothorax, approximately 50%. Critical Value/emergent results were called by telephone at the time of interpretation on 03/29/2021 at 4:56 pm to provider [facility] , who verbally acknowledged these results. Electronically Signed   By: Rolm Baptise M.D.   On: 03/29/2021 16:58    My personal review of EKG: Rhythm sinus tachycardia, Rate  126 /min, QTc 480 , RBBB   Assessment & Plan:    Active Problems:   Lung blebs (HCC)   Persistent cognitive impairment   Pneumothorax   COPD (chronic obstructive pulmonary disease) (HCC)  Pneumothorax -Reviewed pneumothorax, lung blebs in the past, status post VATS/minithoracotomy and resection of apical and lingular blebs, with talc pleurodesis and closure of bronchopleural fistula in 2016. -Required chest tube insertion in ED, repeat x-ray postinfusion showing improvement of pneumothorax. -Patient will be admitted to stepdown, chest tube will be kept on intermittent suction overnight, will repeat KUB in a.m., general surgery consulted regarding chest tube management. -Discussed with daughter at bedside who is an Therapist, sports, at this point no escalation of care, she does not wish for any surgical intervention, plan was conservative management, please see discussion below under goals of care  SIRS -Tachypneic, tachycardic, with elevated lactic acid and leukocytosis, no evidence of infection, but given how tenuous the patient is will keep empirically on broad-spectrum antibiotics, low threshold to narrow if septic work-up remains negative, I will check procalcitonin and trend.  History of COPD -No wheezing, continue with home meds and as needed nebs  Acute on chronic hypoxic/hypercapnic respiratory failure -At baseline on 4 L nasal cannula, ABG showing CO2 retention, patient reported feeling much better after chest tube insertion, wean oxygen back to baseline.   Protein calorie malnutrition -Trend is significantly cachectic, will resume [place] diet with supplements  Dementia -Continue with supportive care  Goals of care -Discussed with daughter at bedside, she reports father has been dementia, wheelchair dependent, living in SNF for last year, reports poor quality of life at baseline, she does not  wish for any aggressive measures, she does not want any surgical intervention, no need to transfer to [facility], and she understands if there is no improvement in patient clinical condition, then likely will proceed with hospice route.    DVT Prophylaxis Lovenox   AM Labs Ordered, also please review Full Orders  Family Communication: Admission, patients condition and plan of care including tests being ordered have been discussed with the patient and bedside who indicate understanding and agree with the plan and Code Status.  Code Status DNR  Likely DC to  back to [facility]  Condition GUARDED    Consults called: ED D/W PCCM, CT surgery at [facility], and general surgery at [facility]    Admission status: inpatient    Time spent in minutes : 33 minutes   Phillips Climes M.D on 03/25/2021 at 6:25 PM   [facility] - Office  [phone]

## 2021-03-19 NOTE — ED Notes (Signed)
Daughter in the room at this time with hospitalist's at bed side to update plan of care.

## 2021-03-19 NOTE — ED Notes (Signed)
Dr. Sabra Heck at bedside for [facility] screening exam.

## 2021-03-19 NOTE — ED Notes (Signed)
Date and time results received: 03/22/2021 1941  Test: lactic Critical Value: 2.6  Name of Provider Notified: Elgergawy, MD  Orders Received? Or Actions Taken?: acknowledged

## 2021-03-19 NOTE — ED Provider Notes (Signed)
[facility] EMERGENCY DEPARTMENT Provider Note   CSN: [id number] Arrival date & time: 04/09/2021  1538     History Chief Complaint  Patient presents with   Shortness of Breath    Alan Huff is a 75 y.o. male.  HPI  This patient is an ill-appearing 75 year old male, he is known to have lung blebs, a persistent air leak in the past, he has had pleurodesis in 2016 as well as stapling of blebs during a video-assisted thoracoscopy.  The patient has known severe COPD, he is on 2 L by nasal cannula at his nursing facility at the [facility] in [place], [place].  He presents by ambulance transport after being found to be severely short of breath and in respiratory distress.  His oxygen saturations were extremely low in the low 80% range and he was very tachypneic and altered.  He was given a DuoNeb prehospital and placed on a nonrebreather during which time his oxygen came up to 91%.  The patient is not able answer my questions, he gives me the occasional one-word answer, level 5 caveat applies secondary to respiratory distress and altered mental status.  There was no reports of prehospital diarrhea vomiting or any significant coughing.  Past Medical History:  Diagnosis Date   Smoking history     Patient Active Problem List   Diagnosis Date Noted   Persistent cognitive impairment 02/28/2019   Lung blebs ([place]) 10/29/2014   Persistent air leak 10/28/2014    Past Surgical History:  Procedure Laterality Date   PLEURADESIS Left 10/29/2014   Procedure: PLEURADESIS;  Surgeon: Ivin Poot, MD;  Location: [place];  Service: Thoracic;  Laterality: Left;   STAPLING OF BLEBS Left 10/29/2014   Procedure: STAPLING OF BLEBS;  Surgeon: Ivin Poot, MD;  Location: [place];  Service: Thoracic;  Laterality: Left;   VIDEO ASSISTED THORACOSCOPY Left 10/29/2014   Procedure: VIDEO ASSISTED THORACOSCOPY;  Surgeon: Ivin Poot, MD;  Location: [place];  Service: Thoracic;  Laterality: Left;        No family history on file.  Social History   Tobacco Use   Smoking status: Former    Packs/day: 1.00    Pack years: 0.00    Types: Cigarettes    Quit date: 09/11/1992    Years since quitting: 28.5   Smokeless tobacco: Never  Substance Use Topics   Alcohol use: No    Alcohol/week: 0.0 standard drinks   Drug use: No    Home Medications Prior to Admission medications   Medication Sig Start Date End Date Taking? Authorizing Provider  albuterol (PROVENTIL HFA;VENTOLIN HFA) 108 (90 BASE) MCG/ACT inhaler Inhale 2 puffs into the lungs every 6 (six) hours as needed for wheezing or shortness of breath.    [provider]  budesonide-formoterol (SYMBICORT) 160-4.5 MCG/ACT inhaler Inhale 2 puffs into the lungs 2 (two) times daily.    [provider]  tiotropium (SPIRIVA) 18 MCG inhalation capsule Place 18 mcg into inhaler and inhale daily.    [provider]    Allergies    Patient has no known allergies.  Review of Systems   Review of Systems  Unable to perform ROS: Mental status change   Physical Exam Updated Vital Signs BP (!) 165/75   Pulse (!) 124   Temp 99.2 F (37.3 C) (Rectal)   Resp (!) 26   Ht 1.702 m (5\' 7" )   Wt 52.2 kg   SpO2 92%   BMI 18.01 kg/m  Physical Exam Vitals and nursing note reviewed.  Constitutional:      General: He is in acute distress.     Appearance: He is well-developed. He is ill-appearing and toxic-appearing.  HENT:     Head: Normocephalic and atraumatic.     Nose: Rhinorrhea present.     Mouth/Throat:     Mouth: Mucous membranes are moist.     Pharynx: No oropharyngeal exudate.  Eyes:     General: No scleral icterus.       Right eye: No discharge.        Left eye: No discharge.     Conjunctiva/sclera: Conjunctivae normal.     Pupils: Pupils are equal, round, and reactive to light.  Neck:     Thyroid: No thyromegaly.     Vascular: No JVD.  Cardiovascular:     Rate and Rhythm: Regular rhythm.  Tachycardia present.     Heart sounds: Normal heart sounds. No murmur heard.   No friction rub. No gallop.  Pulmonary:     Effort: Respiratory distress present.     Breath sounds: Wheezing present. No rales.     Comments: Significantly decreased lung sounds on the right compared to the left, the patient has a prolonged expiratory phase, he is not able to speak except in one-word sentences occasionally.  He is using accessory muscles and is tachypneic Abdominal:     General: Bowel sounds are normal. There is no distension.     Palpations: Abdomen is soft. There is no mass.     Tenderness: There is no abdominal tenderness.  Musculoskeletal:        General: No tenderness. Normal range of motion.     Cervical back: Normal range of motion and neck supple.     Right lower leg: No edema.     Left lower leg: No edema.  Lymphadenopathy:     Cervical: No cervical adenopathy.  Skin:    General: Skin is warm and dry.     Findings: No erythema or rash.  Neurological:     Mental Status: He is alert.     Coordination: Coordination normal.     Comments: The patient is spontaneously moving all 4 extremities minimally, he is somnolent but arousable, does not appear to have a focal deficit but more encephalopathic  Psychiatric:        Behavior: Behavior normal.    ED Results / Procedures / Treatments   Labs (all labs ordered are listed, but only abnormal results are displayed) Labs Reviewed  COMPREHENSIVE METABOLIC PANEL - Abnormal; Notable for the following components:      Result Value   Glucose, Bld 193 (*)    BUN 30 (*)    Calcium 8.8 (*)    All other components within normal limits  CBC WITH DIFFERENTIAL/PLATELET - Abnormal; Notable for the following components:   WBC 20.5 (*)    RBC 3.41 (*)    Hemoglobin 10.7 (*)    HCT 34.1 (*)    Platelets 444 (*)    Neutro Abs 12.5 (*)    Lymphs Abs 6.4 (*)    Eosinophils Absolute 0.6 (*)    Abs Immature Granulocytes 0.11 (*)    All other  components within normal limits  BLOOD GAS, ARTERIAL - Abnormal; Notable for the following components:   pH, Arterial 7.220 (*)    pCO2 arterial 80.2 (*)    pO2, Arterial 74.6 (*)    Acid-Base Excess 4.4 (*)    All other  components within normal limits  CULTURE, BLOOD (ROUTINE X 2)  CULTURE, BLOOD (ROUTINE X 2)  RESP PANEL BY RT-PCR (FLU A&B, COVID) ARPGX2  URINE CULTURE  MRSA NEXT GEN BY PCR, NASAL  LACTIC ACID, PLASMA  PROTIME-INR  APTT  LACTIC ACID, PLASMA  URINALYSIS, ROUTINE W REFLEX MICROSCOPIC    EKG EKG Interpretation  Date/Time:  Saturday March 19 2021 16:10:41 EDT Ventricular Rate:  126 PR Interval:  175 QRS Duration: 111 QT Interval:  335 QTC Calculation: 480 R Axis:   -81 Text Interpretation: Sinus tachycardia Multiple premature complexes, vent & supraven Inferior infarct, old Probable anterior infarct, age indeterminate Right bundle branch block Since last tracing rate faster Confirmed by Noemi Chapel [phone]) on 04/02/2021 4:17:21 PM  Radiology DG Chest Port 1 View  Result Date: 03/26/2021 CLINICAL DATA:  Questionable sepsis.  Shortness of breath EXAM: PORTABLE CHEST 1 VIEW COMPARISON:  12/25/2014 FINDINGS: There is a large right pneumothorax, approximately 50%. There is hyperinflation of the lungs compatible with COPD. No confluent opacity on the left. Heart is normal size. Aortic calcifications. IMPRESSION: Emphysema. Large right pneumothorax, approximately 50%. Critical Value/emergent results were called by telephone at the time of interpretation on 03/31/2021 at 4:56 pm to provider [facility] , who verbally acknowledged these results. Electronically Signed   By: Rolm Baptise M.D.   On: 03/31/2021 16:58    Procedures .Critical Care  Date/Time: 03/14/2021 3:51 PM Performed by: Noemi Chapel, MD Authorized by: Noemi Chapel, MD   Critical care provider statement:    Critical care time (minutes):  35   Critical care time was exclusive of:  Separately billable  procedures and treating other patients and teaching time   Critical care was necessary to treat or prevent imminent or life-threatening deterioration of the following conditions:  Respiratory failure   Critical care was time spent personally by me on the following activities:  Blood draw for specimens, development of treatment plan with patient or surrogate, discussions with consultants, evaluation of patient's response to treatment, examination of patient, obtaining history from patient or surrogate, ordering and performing treatments and interventions, ordering and review of laboratory studies, ordering and review of radiographic studies, pulse oximetry, re-evaluation of patient's condition and review of old charts Comments:       CHEST TUBE INSERTION  Date/Time: 04/08/2021 5:17 PM Performed by: Noemi Chapel, MD Authorized by: Noemi Chapel, MD   Consent:    Consent obtained:  Verbal   Consent given by:  Patient (patient and his daughter by phone)   Risks, benefits, and alternatives were discussed: yes     Risks discussed:  Bleeding, incomplete drainage, nerve damage, infection, pain and damage to surrounding structures   Alternatives discussed:  No treatment and delayed treatment Universal protocol:    Procedure explained and questions answered to patient or proxy's satisfaction: yes     Relevant documents present and verified: yes     Test results available: yes     Imaging studies available: yes     Required blood products, implants, devices, and special equipment available: yes     Site/side marked: yes     Immediately prior to procedure, a time out was called: yes     Patient identity confirmed:  Arm band Pre-procedure details:    Skin preparation:  Chlorhexidine   Preparation: Patient was prepped and draped in the usual sterile fashion   Sedation:    Sedation type:  None Anesthesia:    Anesthesia method:  Local infiltration  Local anesthetic:  Lidocaine 1% w/o epi Procedure  details:    Placement location:  R lateral   Scalpel size:  11   Tube size ([place]): wayne pigtail catheter.   Ultrasound guidance: no     Tube connected to:  Suction and water seal   Drainage characteristics:  Air only   Suture material:  2-0 silk   Dressing:  4x4 sterile gauze Post-procedure details:    Post-insertion x-ray findings: tube in good position     Procedure completion:  Tolerated well, no immediate complications Comments:         Medications Ordered in ED Medications  lactated ringers infusion ( Intravenous New Bag/Given 03/24/2021 1606)  vancomycin (VANCOCIN) IVPB 1000 mg/200 mL premix (1,000 mg Intravenous New Bag/Given 03/18/2021 1654)  albuterol (PROVENTIL,VENTOLIN) solution continuous neb (has no administration in time range)  albuterol (PROVENTIL,VENTOLIN) solution continuous neb (has no administration in time range)  ceFEPIme (MAXIPIME) 2 g in sodium chloride 0.9 % 100 mL IVPB (has no administration in time range)  vancomycin (VANCOREADY) IVPB 750 mg/150 mL (has no administration in time range)  povidone-iodine (BETADINE) 10 % external solution (has no administration in time range)  ceFEPIme (MAXIPIME) 2 g in sodium chloride 0.9 % 100 mL IVPB (0 g Intravenous Stopped 03/21/2021 1648)  lidocaine (PF) (XYLOCAINE) 1 % injection (30 mLs  Given 03/16/2021 1654)    ED Course  I have reviewed the triage vital signs and the nursing notes.  Pertinent labs & imaging results that were available during my care of the patient were reviewed by me and considered in my medical decision making (see chart for details).  Clinical Course as of 03/27/2021 1931  Sat Mar 19, 2021  1644 Discussed the case with Dr. Roxy Manns of the cardiothoracic surgery service who agrees that this patient can be treated here with a pigtail chest tube and the pulmonary critical care folks that round on patients here can continue to treat the patient here as the patient will not have any surgery for this [BM]  1716 Chest  tube placed - CXR ordered for placement [BM]    Clinical Course User Index [BM] Noemi Chapel, MD   MDM Rules/Calculators/A&P                          Significant concern for pathologic processes including potentially hypercapnia, severe COPD, recurrent pneumothorax, infection such as pneumonia, less likely to be pulmonary embolism.  The patient will need multiple testing modalities including a chest x-ray, labs, EKG, continuous nebulizer therapy and high flow oxygen.  Critically ill  Discussed with Dr. Carlis Abbott with PCCM_ they will see the patient on Monday and care for chest Tube  Discussed with Dr. Christian Mate - general surgery who states he is happy to help with any tube issues over the weekend  I personally looked at the chest x-ray and the pneumothorax has completely resolved with the placement of the chest tube.  It is in good position.  Dr. Landis Gandy will admit to high level of care  Final Clinical Impression(s) / ED Diagnoses Final diagnoses:  Acute respiratory distress  Pneumothorax, right     Noemi Chapel, MD 03/30/2021 1931

## 2021-03-19 NOTE — Sepsis Progress Note (Signed)
Sepsis protocol is being followed at [place].

## 2021-03-20 ENCOUNTER — Encounter (HOSPITAL_COMMUNITY): Payer: Self-pay | Admitting: Internal Medicine

## 2021-03-20 ENCOUNTER — Inpatient Hospital Stay (HOSPITAL_COMMUNITY): Payer: No Typology Code available for payment source

## 2021-03-20 DIAGNOSIS — J449 Chronic obstructive pulmonary disease, unspecified: Secondary | ICD-10-CM | POA: Diagnosis not present

## 2021-03-20 DIAGNOSIS — J939 Pneumothorax, unspecified: Secondary | ICD-10-CM | POA: Diagnosis not present

## 2021-03-20 DIAGNOSIS — R0603 Acute respiratory distress: Secondary | ICD-10-CM | POA: Diagnosis not present

## 2021-03-20 DIAGNOSIS — J439 Emphysema, unspecified: Secondary | ICD-10-CM | POA: Diagnosis not present

## 2021-03-20 LAB — LACTIC ACID, PLASMA: Lactic Acid, Venous: 2.2 mmol/L (ref 0.5–1.9)

## 2021-03-20 LAB — CBC
HCT: 27.6 % — ABNORMAL LOW (ref 39.0–52.0)
Hemoglobin: 8.6 g/dL — ABNORMAL LOW (ref 13.0–17.0)
MCH: 30.9 pg (ref 26.0–34.0)
MCHC: 31.2 g/dL (ref 30.0–36.0)
MCV: 99.3 fL (ref 80.0–100.0)
Platelets: 254 10*3/uL (ref 150–400)
RBC: 2.78 MIL/uL — ABNORMAL LOW (ref 4.22–5.81)
RDW: 14.9 % (ref 11.5–15.5)
WBC: 11.5 10*3/uL — ABNORMAL HIGH (ref 4.0–10.5)
nRBC: 0 % (ref 0.0–0.2)

## 2021-03-20 LAB — URINALYSIS, ROUTINE W REFLEX MICROSCOPIC
Bilirubin Urine: NEGATIVE
Glucose, UA: 50 mg/dL — AB
Hgb urine dipstick: NEGATIVE
Ketones, ur: 5 mg/dL — AB
Nitrite: NEGATIVE
Protein, ur: 30 mg/dL — AB
Specific Gravity, Urine: 1.018 (ref 1.005–1.030)
pH: 6 (ref 5.0–8.0)

## 2021-03-20 LAB — BASIC METABOLIC PANEL
Anion gap: 7 (ref 5–15)
BUN: 32 mg/dL — ABNORMAL HIGH (ref 8–23)
CO2: 28 mmol/L (ref 22–32)
Calcium: 8.2 mg/dL — ABNORMAL LOW (ref 8.9–10.3)
Chloride: 104 mmol/L (ref 98–111)
Creatinine, Ser: 0.81 mg/dL (ref 0.61–1.24)
GFR, Estimated: >60 mL/min (ref >60)
Glucose, Bld: 142 mg/dL — ABNORMAL HIGH (ref 70–99)
Potassium: 4 mmol/L (ref 3.5–5.1)
Sodium: 139 mmol/L (ref 135–145)

## 2021-03-20 LAB — PROCALCITONIN: Procalcitonin: 5.6 ng/mL

## 2021-03-20 MED ORDER — CHLORHEXIDINE GLUCONATE 0.12 % MT SOLN
15.0000 mL | Freq: Two times a day (BID) | OROMUCOSAL | Status: DC
  Administered 2021-03-20 – 2021-04-03 (×26): 15 mL via OROMUCOSAL
  Filled 2021-03-20 (×26): qty 15

## 2021-03-20 MED ORDER — ENOXAPARIN SODIUM 30 MG/0.3ML IJ SOSY
30.0000 mg | PREFILLED_SYRINGE | INTRAMUSCULAR | Status: DC
  Administered 2021-03-20 – 2021-04-03 (×15): 30 mg via SUBCUTANEOUS
  Filled 2021-03-20 (×16): qty 0.3

## 2021-03-20 MED ORDER — LACTATED RINGERS IV BOLUS
1000.0000 mL | Freq: Once | INTRAVENOUS | Status: AC
  Administered 2021-03-20: 1000 mL via INTRAVENOUS

## 2021-03-20 MED ORDER — LACTATED RINGERS IV BOLUS
500.0000 mL | Freq: Once | INTRAVENOUS | Status: AC
  Administered 2021-03-20: 500 mL via INTRAVENOUS

## 2021-03-20 MED ORDER — HALOPERIDOL LACTATE 5 MG/ML IJ SOLN
1.5000 mg | Freq: Once | INTRAMUSCULAR | Status: AC
  Administered 2021-03-20: 1.5 mg via INTRAVENOUS
  Filled 2021-03-20: qty 1

## 2021-03-20 MED ORDER — ORAL CARE MOUTH RINSE
15.0000 mL | Freq: Two times a day (BID) | OROMUCOSAL | Status: DC
  Administered 2021-03-20 – 2021-04-03 (×21): 15 mL via OROMUCOSAL

## 2021-03-20 MED ORDER — UMECLIDINIUM BROMIDE 62.5 MCG/INH IN AEPB
1.0000 | INHALATION_SPRAY | Freq: Every day | RESPIRATORY_TRACT | Status: DC
  Administered 2021-03-20 – 2021-04-02 (×14): 1 via RESPIRATORY_TRACT
  Filled 2021-03-20 (×5): qty 7

## 2021-03-20 NOTE — Progress Notes (Signed)
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  He was found to have large right-sided pneumothorax and had chest tube placed in the emergency room.   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 -No wheezing or shortness of breath at this time -He is chronically on 4 L of oxygen  SIRS -Patient was tachypneic and tachycardic on admission -He was noted to have elevated lactic acid -Lactic acid has been trending down with IV fluids -Currently on broad-spectrum antibiotics -No evidence of pneumonia on chest x-ray -We will continue broad-spectrum antibiotics for now -Blood cultures have been sent, if remain negative by tomorrow, Can consider discontinuing antibiotics  Protein calorie malnutrition -BMI 15 -Nutrition consult  Dementia -Continue supportive care  Anemia -Daughter reports that he was previously on iron supplements -No signs of bleeding at present -Check anemia panel  Goals of care -Patient is a long-term resident of a skilled nursing facility and is wheelchair dependent -His daughter reports poor quality of life at baseline -She does not  wish aggressive measures -If patient's clinical condition does not improve, likely will proceed with hospice referral   DVT prophylaxis: enoxaparin (LOVENOX) injection 30 mg Start: 03/20/21 2000  Code Status: DNR Family Communication: Updated patient's daughter 41/10 Disposition Plan: Status is: Inpatient  Remains inpatient appropriate because:Ongoing diagnostic testing needed not appropriate for outpatient work up, IV treatments appropriate due to intensity of illness or inability to take PO, and Inpatient level of care appropriate due to severity of illness  Dispo: The patient is from: SNF              Anticipated d/c is to: SNF              Patient currently is not medically stable to d/c.   Difficult to place patient No         Consultants:  General surgery  Procedures:  Right chest tube placement on 7/9  Antimicrobials:  Vancomycin 7/9 > Cefepime 7/9 >   Subjective: Says he is feeling better.  Denies any pain.  Denies any shortness of breath.  Objective: Vitals:   03/20/21 0913 03/20/21 1000 03/20/21 1102 03/20/21 1136  BP: 127/69 (!) 95/47 (!) 95/32 132/84  Pulse: 90 82 79 91  Resp: (!) 23 18 15 20   Temp:      TempSrc:      SpO2: 100% 100% 99% 100%  Weight:      Height:        Intake/Output Summary (Last 24 hours) at 03/20/2021 1146 Last data filed at 03/20/2021 0647 Gross per 24 hour  Intake 2742.19 ml  Output 375 ml  Net 2367.19 ml   Filed Weights   03/29/2021 1610 04/04/2021 2331  Weight: 52.2 kg  43.5 kg    Examination:  General exam: Appears calm and comfortable  Respiratory system: Clear to auscultation. Respiratory effort normal.  Chest tube is in right chest Cardiovascular system: S1 & S2 heard, RRR. No JVD, murmurs, rubs, gallops or clicks. No pedal edema. Gastrointestinal system: Abdomen is nondistended, soft and nontender. No organomegaly or masses felt. Normal bowel sounds heard. Central nervous system: Alert and oriented. No focal  neurological deficits. Extremities: Symmetric 5 x 5 power. Skin: No rashes, lesions or ulcers Psychiatry: Judgement and insight appear normal. Mood & affect appropriate.     Data Reviewed: I have personally reviewed following labs and imaging studies  CBC: Recent Labs  Lab 04/02/2021 1559 03/20/21 0211  WBC 20.5* 11.5*  NEUTROABS 12.5*  --   HGB 10.7* 8.6*  HCT 34.1* 27.6*  MCV 100.0 99.3  PLT 444* 403   Basic Metabolic Panel: Recent Labs  Lab 03/18/2021 1559 03/26/2021 2328 03/20/21 0211  NA 139  --  139  K 3.5  --  4.0  CL 99  --  104  CO2 31  --  28  GLUCOSE 193*  --  142*  BUN 30*  --  32*  CREATININE 0.87 0.95 0.81  CALCIUM 8.8*  --  8.2*   GFR: Estimated Creatinine Clearance: 47.7 mL/min (by C-G formula based on SCr of 0.81 mg/dL). Liver Function Tests: Recent Labs  Lab 03/28/2021 1559  AST 21  ALT 14  ALKPHOS 50  BILITOT 0.6  PROT 7.8  ALBUMIN 3.8   No results for input(s): LIPASE, AMYLASE in the last 168 hours. No results for input(s): AMMONIA in the last 168 hours. Coagulation Profile: Recent Labs  Lab 03/17/2021 1559  INR 1.0   Cardiac Enzymes: No results for input(s): CKTOTAL, CKMB, CKMBINDEX, TROPONINI in the last 168 hours. BNP (last 3 results) No results for input(s): PROBNP in the last 8760 hours. HbA1C: No results for input(s): HGBA1C in the last 72 hours. CBG: No results for input(s): GLUCAP in the last 168 hours. Lipid Profile: No results for input(s): CHOL, HDL, LDLCALC, TRIG, CHOLHDL, LDLDIRECT in the last 72 hours. Thyroid Function Tests: No results for input(s): TSH, T4TOTAL, FREET4, T3FREE, THYROIDAB in the last 72 hours. Anemia Panel: No results for input(s): VITAMINB12, FOLATE, FERRITIN, TIBC, IRON, RETICCTPCT in the last 72 hours. Sepsis Labs: Recent Labs  Lab 03/22/2021 1750 04/07/2021 1915 03/14/2021 2328 03/20/21 0211  PROCALCITON 0.26  --   --  5.60  LATICACIDVEN 2.0* 2.6* 3.4* 2.2*    Recent Results (from the past 240  hour(s))  Resp Panel by RT-PCR (Flu A&B, Covid) Nasopharyngeal Swab     Status: None   Collection Time: 03/20/2021  3:53 PM   Specimen: Nasopharyngeal Swab; Nasopharyngeal(NP) swabs in vial transport medium  Result Value Ref Range Status   SARS Coronavirus 2 by RT PCR NEGATIVE NEGATIVE Final    Comment: (NOTE) SARS-CoV-2 target nucleic acids are NOT DETECTED.  The SARS-CoV-2 RNA is generally detectable in upper respiratory specimens during the acute phase of infection. The lowest concentration of SARS-CoV-2 viral copies this assay can detect is 138 copies/mL. A negative result does not preclude SARS-Cov-2 infection and should not be used as the sole basis for treatment or other patient management decisions. A negative result may occur with  improper specimen collection/handling, submission of specimen other than nasopharyngeal swab, presence of viral mutation(s) within the areas targeted by this assay, and inadequate number of viral copies(<138 copies/mL). A negative result must be combined with  clinical observations, patient history, and epidemiological information. The expected result is Negative.  Fact Sheet for Patients:  EntrepreneurPulse.com.au  Fact Sheet for Healthcare Providers:  IncredibleEmployment.be  This test is no t yet approved or cleared by the [place] FDA and  has been authorized for detection and/or diagnosis of SARS-CoV-2 by FDA under an Emergency Use Authorization (EUA). This EUA will remain  in effect (meaning this test can be used) for the duration of the COVID-19 declaration under Section 564(b)(1) of the Act, 21 U.S.C.section 360bbb-3(b)(1), unless the authorization is terminated  or revoked sooner.       Influenza A by PCR NEGATIVE NEGATIVE Final   Influenza B by PCR NEGATIVE NEGATIVE Final    Comment: (NOTE) The Xpert Xpress SARS-CoV-2/FLU/RSV plus assay is intended as an aid in the diagnosis of influenza from  Nasopharyngeal swab specimens and should not be used as a sole basis for treatment. Nasal washings and aspirates are unacceptable for Xpert Xpress SARS-CoV-2/FLU/RSV testing.  Fact Sheet for Patients: EntrepreneurPulse.com.au  Fact Sheet for Healthcare Providers: IncredibleEmployment.be  This test is not yet approved or cleared by the [place] FDA and has been authorized for detection and/or diagnosis of SARS-CoV-2 by FDA under an Emergency Use Authorization (EUA). This EUA will remain in effect (meaning this test can be used) for the duration of the COVID-19 declaration under Section 564(b)(1) of the Act, 21 U.S.C. section 360bbb-3(b)(1), unless the authorization is terminated or revoked.  Performed at [facility], [address], [place], [place] [address]   Blood Culture (routine x 2)     Status: None (Preliminary result)   Collection Time: 03/30/2021  3:59 PM   Specimen: BLOOD RIGHT ARM  Result Value Ref Range Status   Specimen Description   Final    BLOOD RIGHT ARM BOTTLES DRAWN AEROBIC AND ANAEROBIC   Special Requests   Final    Blood Culture adequate volume Performed at [facility], [address] [place], [place] [address]    Culture PENDING  Incomplete   Report Status PENDING  Incomplete  Blood Culture (routine x 2)     Status: None (Preliminary result)   Collection Time: 03/30/2021  4:15 PM   Specimen: Right Antecubital; Blood  Result Value Ref Range Status   Specimen Description   Final    RIGHT ANTECUBITAL BOTTLES DRAWN AEROBIC AND ANAEROBIC   Special Requests   Final    Blood Culture adequate volume Performed at [facility], [address] [place], [place] [address]    Culture PENDING  Incomplete   Report Status PENDING  Incomplete  MRSA Next Gen by PCR, Nasal     Status: None   Collection Time: 03/18/2021  4:29 PM   Specimen: Nasal Mucosa; Nasal Swab  Result Value Ref Range Status   MRSA by PCR Next Gen NOT DETECTED  NOT DETECTED Final    Comment: (NOTE) The GeneXpert MRSA Assay (FDA approved for NASAL specimens only), is one component of a comprehensive MRSA colonization surveillance program. It is not intended to diagnose MRSA infection nor to guide or monitor treatment for MRSA infections. Test performance is not FDA approved in patients less than 31 years old. Performed at [facility], [address] [place], [place] [address]          Radiology Studies: [facility] 1 View  Result Date: 03/20/2021 CLINICAL DATA:  Pneumothorax.  Former smoker. EXAM: PORTABLE CHEST 1 VIEW COMPARISON:  Chest x-rays dated 03/30/2021. FINDINGS: RIGHT-sided chest tube is  stable in position. No residual pneumothorax seen. Lungs are hyperexpanded. Chronic bronchitic changes centrally. Coarse lung markings are seen bilaterally. No confluent opacity to suggest a superimposed pneumonia or pulmonary edema. No pleural effusion is seen. Heart size and mediastinal contours are within normal limits. IMPRESSION: 1. RIGHT-sided chest tube is stable in position. No residual pneumothorax seen. 2. No active disease. No evidence of pneumonia or pulmonary edema. 3. Hyperexpanded lungs indicating COPD. Associated chronic bronchitic changes and evidence of chronic interstitial lung disease/fibrosis. Electronically Signed   By: Franki Cabot M.D.   On: 03/20/2021 05:54   DG Chest Port 1 View  Result Date: 04/09/2021 CLINICAL DATA:  Right-sided chest tube placement EXAM: PORTABLE CHEST 1 VIEW COMPARISON:  03/21/2021, 4:27 p.m. FINDINGS: Interval placement of a right-sided pigtail chest tube with re-expansion of the right lung and resolution of a previously seen approximately 50% right pneumothorax. There is no significant residual pneumothorax. Small volume subcutaneous emphysema about the right chest wall. The left lung is normally aerated. Heart and mediastinum are unremarkable. IMPRESSION: Interval placement of a right-sided pigtail chest  tube with re-expansion of the right lung and resolution of a previously seen approximately 50% right pneumothorax. There is no significant residual pneumothorax. Electronically Signed   By: Eddie Candle M.D.   On: 03/24/2021 18:32   DG Chest Port 1 View  Result Date: 04/03/2021 CLINICAL DATA:  Questionable sepsis.  Shortness of breath EXAM: PORTABLE CHEST 1 VIEW COMPARISON:  12/25/2014 FINDINGS: There is a large right pneumothorax, approximately 50%. There is hyperinflation of the lungs compatible with COPD. No confluent opacity on the left. Heart is normal size. Aortic calcifications. IMPRESSION: Emphysema. Large right pneumothorax, approximately 50%. Critical Value/emergent results were called by telephone at the time of interpretation on 04/06/2021 at 4:56 pm to provider [facility] , who verbally acknowledged these results. Electronically Signed   By: Rolm Baptise M.D.   On: 03/22/2021 16:58        Scheduled Meds:  chlorhexidine  15 mL Mouth Rinse BID   Chlorhexidine Gluconate Cloth  6 each Topical Daily   enoxaparin (LOVENOX) injection  30 mg Subcutaneous Q24H   feeding supplement  237 mL Oral BID BM   mouth rinse  15 mL Mouth Rinse q12n4p   mometasone-formoterol  2 puff Inhalation BID   umeclidinium bromide  1 puff Inhalation Daily   Continuous Infusions:  ceFEPime (MAXIPIME) IV Stopped (03/20/21 0412)   lactated ringers 75 mL/hr at 03/20/21 0647   vancomycin       LOS: 1 day    Time spent: 60mins    Kathie Dike, MD [facility]   If 7PM-7AM, please contact night-coverage [email or web address]  03/20/2021, 11:46 AM

## 2021-03-20 NOTE — Progress Notes (Signed)
[facility] contacted Dr [facility] about ordering additional lactic acid due to last result of 2.6, orders received

## 2021-03-20 NOTE — Progress Notes (Signed)
Critical Lactic of 2.2 reported to MD. This is an improvement.

## 2021-03-20 NOTE — Progress Notes (Signed)
[facility] night shift.  The nursing staff reported that the patient's lactic acid increased to 3.4 mmol/L from 2.6 mmol/L.  A 1000 mL LR bolus has been ordered.  Lactic acid follow-up will be done with morning labs.  Tennis Must, MD.

## 2021-03-20 NOTE — Progress Notes (Signed)
MD notified of critical Lactic Acid increase 3.4.

## 2021-03-20 NOTE — Progress Notes (Signed)
[facility] night shift stepdown coverage note.  The nursing staff reported that the patient woke up from a dream very anxious and confused.  He is very restless now and needs something to help him relax.  His QT interval is about 260 ms on the monitor.  Haloperidol 1.5 mg IVP x1 dose was ordered.  Tennis Must, MD.

## 2021-03-20 NOTE — Clinical Social Work Note (Signed)
[place] notification done [phone]

## 2021-03-20 NOTE — Progress Notes (Signed)
MD notified of hypotension.

## 2021-03-20 NOTE — Progress Notes (Signed)
[facility] night shift stepdown coverage note.  The nursing staff reported that the patient's blood pressure was 99/39 mmHg with a MAP of 58 mmHg.  His blood pressures have been running soft since dayshift.  He received 1000 mL of LR bolus during dayshift due to hypotension.  A 500 mL LR bolus ordered.  Tennis Must, MD.

## 2021-03-21 DIAGNOSIS — Z7189 Other specified counseling: Secondary | ICD-10-CM

## 2021-03-21 DIAGNOSIS — R0603 Acute respiratory distress: Secondary | ICD-10-CM | POA: Diagnosis not present

## 2021-03-21 DIAGNOSIS — Z515 Encounter for palliative care: Secondary | ICD-10-CM | POA: Diagnosis not present

## 2021-03-21 DIAGNOSIS — J939 Pneumothorax, unspecified: Secondary | ICD-10-CM | POA: Diagnosis not present

## 2021-03-21 DIAGNOSIS — J439 Emphysema, unspecified: Secondary | ICD-10-CM | POA: Diagnosis not present

## 2021-03-21 DIAGNOSIS — J449 Chronic obstructive pulmonary disease, unspecified: Secondary | ICD-10-CM | POA: Diagnosis not present

## 2021-03-21 LAB — CBC
HCT: 25.6 % — ABNORMAL LOW (ref 39.0–52.0)
Hemoglobin: 7.8 g/dL — ABNORMAL LOW (ref 13.0–17.0)
MCH: 30.4 pg (ref 26.0–34.0)
MCHC: 30.5 g/dL (ref 30.0–36.0)
MCV: 99.6 fL (ref 80.0–100.0)
Platelets: 282 10*3/uL (ref 150–400)
RBC: 2.57 MIL/uL — ABNORMAL LOW (ref 4.22–5.81)
RDW: 15.5 % (ref 11.5–15.5)
WBC: 12.5 10*3/uL — ABNORMAL HIGH (ref 4.0–10.5)
nRBC: 0 % (ref 0.0–0.2)

## 2021-03-21 LAB — RENAL FUNCTION PANEL
Albumin: 2.6 g/dL — ABNORMAL LOW (ref 3.5–5.0)
Anion gap: 4 — ABNORMAL LOW (ref 5–15)
BUN: 24 mg/dL — ABNORMAL HIGH (ref 8–23)
CO2: 30 mmol/L (ref 22–32)
Calcium: 8 mg/dL — ABNORMAL LOW (ref 8.9–10.3)
Chloride: 106 mmol/L (ref 98–111)
Creatinine, Ser: 0.84 mg/dL (ref 0.61–1.24)
GFR, Estimated: >60 mL/min (ref >60)
Glucose, Bld: 90 mg/dL (ref 70–99)
Phosphorus: 2.5 mg/dL (ref 2.5–4.6)
Potassium: 3.9 mmol/L (ref 3.5–5.1)
Sodium: 140 mmol/L (ref 135–145)

## 2021-03-21 LAB — URINE CULTURE: Culture: NO GROWTH

## 2021-03-21 LAB — RETICULOCYTES
Immature Retic Fract: 14.7 % (ref 2.3–15.9)
RBC.: 2.53 MIL/uL — ABNORMAL LOW (ref 4.22–5.81)
Retic Count, Absolute: 50.3 10*3/uL (ref 19.0–186.0)
Retic Ct Pct: 2 % (ref 0.4–3.1)

## 2021-03-21 LAB — IRON AND TIBC
Iron: 52 ug/dL (ref 45–182)
Saturation Ratios: 26 % (ref 17.9–39.5)
TIBC: 198 ug/dL — ABNORMAL LOW (ref 250–450)
UIBC: 146 ug/dL

## 2021-03-21 LAB — FERRITIN: Ferritin: 191 ng/mL (ref 24–336)

## 2021-03-21 LAB — PROCALCITONIN: Procalcitonin: 7.35 ng/mL

## 2021-03-21 LAB — VITAMIN B12: Vitamin B-12: 264 pg/mL (ref 180–914)

## 2021-03-21 LAB — LACTIC ACID, PLASMA: Lactic Acid, Venous: 0.7 mmol/L (ref 0.5–1.9)

## 2021-03-21 LAB — FOLATE: Folate: 11.1 ng/mL (ref >5.9)

## 2021-03-21 NOTE — Progress Notes (Signed)
Palliative-   Brief note- full consult to follow-   Met with patient's daughterRocco Serene. Discussed patient's life review and current situation. Discussed that if patient does not improve then will need to make decision regarding option of pulling chest tube and providing comfort measures only vs placement of tube with flutter valve and likely will need Hospice at discharge.  Shelli receptive to information- she is going to begin discussions with her other family members. No changes to current plan of care.   Mariana Kaufman, AGNP-C Palliative Medicine  No charge note

## 2021-03-21 NOTE — Progress Notes (Signed)
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  He was found to have large right-sided pneumothorax and had chest tube placed in the emergency room.   Assessment & Plan:   Active Problems:   Lung blebs (HCC)   Persistent cognitive impairment   Pneumothorax   COPD (chronic obstructive pulmonary disease) (HCC)   Right-sided pneumothorax -History of pulm blebs in the past status post VATS/minithoracotomy and resection of apical and lingular blebs with talc pleurodesis and closure of bronchopleural fistula in 2016 -Patient had chest tube placed in the emergency room on 7/9 -Follow-up chest x-rays have shown resolution of pneumothorax -General surgery consulted to assist with management of chest tube -patient continues to have air leak -surgery had mentioned possibility of applying a flutter valve to tube?  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 -No wheezing or shortness of breath at this time -He is chronically on 4 L of oxygen  SIRS -Patient was tachypneic and tachycardic on admission -He was noted to have elevated lactic acid -Lactic acid has been trending down with IV fluids -Currently on broad-spectrum antibiotics -No evidence of pneumonia on chest x-ray -Blood/urine cultures have shown no growth and he has been afebrile -MRSA PCR negative -discontinue vancomycin -will discontinue cefepime in next 24 hours if remains stable  Protein calorie malnutrition -BMI 15 -Nutrition consult  Dementia -Continue supportive care  Anemia -Daughter reports that he was previously on iron supplements -No signs of bleeding at present -anemia panel does not show significant iron deficiency -continue to  follow  Goals of care -Patient is a long-term resident of a skilled nursing facility and is wheelchair dependent -His daughter reports poor quality of life at baseline -She does not wish aggressive measures -If patient's clinical condition does not improve, likely will proceed with hospice referral -will ask palliative care to see to help address goals of care   DVT prophylaxis: enoxaparin (LOVENOX) injection 30 mg Start: 03/20/21 2000  Code Status: DNR Family Communication: Updated patient's daughter 14/10 Disposition Plan: Status is: Inpatient  Remains inpatient appropriate because:Ongoing diagnostic testing needed not appropriate for outpatient work up, IV treatments appropriate due to intensity of illness or inability to take PO, and Inpatient level of care appropriate due to severity of illness  Dispo: The patient is from: SNF              Anticipated d/c is to: SNF              Patient currently is not medically stable to d/c.   Difficult to place patient No   Consultants:  General surgery  Procedures:  Right chest tube placement on 7/9  Antimicrobials:  Vancomycin 7/9 >7/11 Cefepime 7/9 >   Subjective: Complains of pain in right chest tube site  Objective: Vitals:   03/21/21 0830 03/21/21 0900 03/21/21 0930 03/21/21 1000  BP: (!) 149/53 (!) 125/43 (!) 111/48 (!) 142/67  Pulse: 86 78 72 81  Resp: 19 19 16 16   Temp:      TempSrc:      SpO2: 95% 96% 99% 97%  Weight:      Height:        Intake/Output Summary (Last 24 hours) at 03/21/2021 1051 Last data filed at 03/21/2021 [phone]  Gross per 24 hour  Intake 4366.95 ml  Output 3086 ml  Net 1280.95 ml   Filed Weights   03/23/2021 1610 03/14/2021 2331 03/21/21 0400  Weight: 52.2 kg 43.5 kg 45.9 kg    Examination:  General exam: Alert, awake, no distress, cachectic Respiratory system: Clear to auscultation. Respiratory effort normal. Right chest tube Cardiovascular system:RRR. No murmurs, rubs,  gallops. Gastrointestinal system: Abdomen is nondistended, soft and nontender. No organomegaly or masses felt. Normal bowel sounds heard. Central nervous system: Alert and oriented. No focal neurological deficits. Extremities: No C/C/E, +pedal pulses Skin: No rashes, lesions or ulcers Psychiatry: Judgement and insight appear normal. Mood & affect appropriate.      Data Reviewed: I have personally reviewed following labs and imaging studies  CBC: Recent Labs  Lab 03/27/2021 1559 03/20/21 0211 03/21/21 0432  WBC 20.5* 11.5* 12.5*  NEUTROABS 12.5*  --   --   HGB 10.7* 8.6* 7.8*  HCT 34.1* 27.6* 25.6*  MCV 100.0 99.3 99.6  PLT 444* 254 016   Basic Metabolic Panel: Recent Labs  Lab 04/06/2021 1559 04/01/2021 2328 03/20/21 0211 03/21/21 0432  NA 139  --  139 140  K 3.5  --  4.0 3.9  CL 99  --  104 106  CO2 31  --  28 30  GLUCOSE 193*  --  142* 90  BUN 30*  --  32* 24*  CREATININE 0.87 0.95 0.81 0.84  CALCIUM 8.8*  --  8.2* 8.0*  PHOS  --   --   --  2.5   GFR: Estimated Creatinine Clearance: 48.6 mL/min (by C-G formula based on SCr of 0.84 mg/dL). Liver Function Tests: Recent Labs  Lab 04/03/2021 1559 03/21/21 0432  AST 21  --   ALT 14  --   ALKPHOS 50  --   BILITOT 0.6  --   PROT 7.8  --   ALBUMIN 3.8 2.6*   No results for input(s): LIPASE, AMYLASE in the last 168 hours. No results for input(s): AMMONIA in the last 168 hours. Coagulation Profile: Recent Labs  Lab 03/28/2021 1559  INR 1.0   Cardiac Enzymes: No results for input(s): CKTOTAL, CKMB, CKMBINDEX, TROPONINI in the last 168 hours. BNP (last 3 results) No results for input(s): PROBNP in the last 8760 hours. HbA1C: No results for input(s): HGBA1C in the last 72 hours. CBG: No results for input(s): GLUCAP in the last 168 hours. Lipid Profile: No results for input(s): CHOL, HDL, LDLCALC, TRIG, CHOLHDL, LDLDIRECT in the last 72 hours. Thyroid Function Tests: No results for input(s): TSH, T4TOTAL, FREET4,  T3FREE, THYROIDAB in the last 72 hours. Anemia Panel: Recent Labs    03/21/21 0433  VITAMINB12 264  FOLATE 11.1  FERRITIN 191  TIBC 198*  IRON 52  RETICCTPCT 2.0   Sepsis Labs: Recent Labs  Lab 03/30/2021 1750 03/16/2021 1915 03/28/2021 2328 03/20/21 0211 03/21/21 0432  PROCALCITON 0.26  --   --  5.60 7.35  LATICACIDVEN 2.0* 2.6* 3.4* 2.2* 0.7    Recent Results (from the past 240 hour(s))  Resp Panel by RT-PCR (Flu A&B, Covid) Nasopharyngeal Swab     Status: None   Collection Time: 03/26/2021  3:53 PM   Specimen: Nasopharyngeal Swab; Nasopharyngeal(NP) swabs in vial transport medium  Result Value Ref Range Status   SARS Coronavirus 2 by RT PCR NEGATIVE NEGATIVE Final    Comment: (NOTE) SARS-CoV-2 target nucleic acids are NOT DETECTED.  The SARS-CoV-2 RNA is generally detectable in upper respiratory specimens during  the acute phase of infection. The lowest concentration of SARS-CoV-2 viral copies this assay can detect is 138 copies/mL. A negative result does not preclude SARS-Cov-2 infection and should not be used as the sole basis for treatment or other patient management decisions. A negative result may occur with  improper specimen collection/handling, submission of specimen other than nasopharyngeal swab, presence of viral mutation(s) within the areas targeted by this assay, and inadequate number of viral copies(<138 copies/mL). A negative result must be combined with clinical observations, patient history, and epidemiological information. The expected result is Negative.  Fact Sheet for Patients:  EntrepreneurPulse.com.au  Fact Sheet for Healthcare Providers:  IncredibleEmployment.be  This test is no t yet approved or cleared by the [place] FDA and  has been authorized for detection and/or diagnosis of SARS-CoV-2 by FDA under an Emergency Use Authorization (EUA). This EUA will remain  in effect (meaning this test can be used)  for the duration of the COVID-19 declaration under Section 564(b)(1) of the Act, 21 U.S.C.section 360bbb-3(b)(1), unless the authorization is terminated  or revoked sooner.       Influenza A by PCR NEGATIVE NEGATIVE Final   Influenza B by PCR NEGATIVE NEGATIVE Final    Comment: (NOTE) The Xpert Xpress SARS-CoV-2/FLU/RSV plus assay is intended as an aid in the diagnosis of influenza from Nasopharyngeal swab specimens and should not be used as a sole basis for treatment. Nasal washings and aspirates are unacceptable for Xpert Xpress SARS-CoV-2/FLU/RSV testing.  Fact Sheet for Patients: EntrepreneurPulse.com.au  Fact Sheet for Healthcare Providers: IncredibleEmployment.be  This test is not yet approved or cleared by the [place] FDA and has been authorized for detection and/or diagnosis of SARS-CoV-2 by FDA under an Emergency Use Authorization (EUA). This EUA will remain in effect (meaning this test can be used) for the duration of the COVID-19 declaration under Section 564(b)(1) of the Act, 21 U.S.C. section 360bbb-3(b)(1), unless the authorization is terminated or revoked.  Performed at [facility], [address] [place], [place] [address]   Blood Culture (routine x 2)     Status: None (Preliminary result)   Collection Time: 03/31/2021  3:59 PM   Specimen: BLOOD RIGHT ARM  Result Value Ref Range Status   Specimen Description   Final    BLOOD RIGHT ARM BOTTLES DRAWN AEROBIC AND ANAEROBIC   Special Requests Blood Culture adequate volume  Final   Culture   Final    NO GROWTH 2 DAYS Performed at [facility], [address], [place], [place] [address]    Report Status PENDING  Incomplete  Blood Culture (routine x 2)     Status: None (Preliminary result)   Collection Time: 03/17/2021  4:15 PM   Specimen: Right Antecubital; Blood  Result Value Ref Range Status   Specimen Description   Final    RIGHT ANTECUBITAL BOTTLES DRAWN AEROBIC AND  ANAEROBIC   Special Requests Blood Culture adequate volume  Final   Culture   Final    NO GROWTH 2 DAYS Performed at [facility], [address], [place],  [address]    Report Status PENDING  Incomplete  MRSA Next Gen by PCR, Nasal     Status: None   Collection Time: 03/30/2021  4:29 PM   Specimen: Nasal Mucosa; Nasal Swab  Result Value Ref Range Status   MRSA by PCR Next Gen NOT DETECTED NOT DETECTED Final    Comment: (NOTE) The GeneXpert MRSA Assay (FDA approved for NASAL specimens only), is one component  of a comprehensive MRSA colonization surveillance program. It is not intended to diagnose MRSA infection nor to guide or monitor treatment for MRSA infections. Test performance is not FDA approved in patients less than 63 years old. Performed at [facility], [address], [place], [place] [address]   Urine culture     Status: None   Collection Time: 03/12/2021 11:45 PM   Specimen: In/Out Cath Urine  Result Value Ref Range Status   Specimen Description   Final    IN/OUT CATH URINE Performed at [facility], [address], [place], [place] [address]    Special Requests   Final    NONE Performed at [facility], [address] [place], [place] [address]    Culture   Final    NO GROWTH Performed at [facility], [place] [address] [place], [place] [address]    Report Status 03/21/2021 FINAL  Final         Radiology Studies: DG Chest Port 1 View  Result Date: 03/20/2021 CLINICAL DATA:  Pneumothorax.  Former smoker. EXAM: PORTABLE CHEST 1 VIEW COMPARISON:  Chest x-rays dated 04/04/2021. FINDINGS: RIGHT-sided chest tube is stable in position. No residual pneumothorax seen. Lungs are hyperexpanded. Chronic bronchitic changes centrally. Coarse lung markings are seen bilaterally. No confluent opacity to suggest a superimposed pneumonia or pulmonary edema. No pleural effusion is seen. Heart size and mediastinal contours are within normal limits. IMPRESSION: 1.  RIGHT-sided chest tube is stable in position. No residual pneumothorax seen. 2. No active disease. No evidence of pneumonia or pulmonary edema. 3. Hyperexpanded lungs indicating COPD. Associated chronic bronchitic changes and evidence of chronic interstitial lung disease/fibrosis. Electronically Signed   By: Franki Cabot M.D.   On: 03/20/2021 05:54   DG Chest Port 1 View  Result Date: 04/02/2021 CLINICAL DATA:  Right-sided chest tube placement EXAM: PORTABLE CHEST 1 VIEW COMPARISON:  04/03/2021, 4:27 p.m. FINDINGS: Interval placement of a right-sided pigtail chest tube with re-expansion of the right lung and resolution of a previously seen approximately 50% right pneumothorax. There is no significant residual pneumothorax. Small volume subcutaneous emphysema about the right chest wall. The left lung is normally aerated. Heart and mediastinum are unremarkable. IMPRESSION: Interval placement of a right-sided pigtail chest tube with re-expansion of the right lung and resolution of a previously seen approximately 50% right pneumothorax. There is no significant residual pneumothorax. Electronically Signed   By: Eddie Candle M.D.   On: 04/04/2021 18:32   DG Chest Port 1 View  Result Date: 03/18/2021 CLINICAL DATA:  Questionable sepsis.  Shortness of breath EXAM: PORTABLE CHEST 1 VIEW COMPARISON:  12/25/2014 FINDINGS: There is a large right pneumothorax, approximately 50%. There is hyperinflation of the lungs compatible with COPD. No confluent opacity on the left. Heart is normal size. Aortic calcifications. IMPRESSION: Emphysema. Large right pneumothorax, approximately 50%. Critical Value/emergent results were called by telephone at the time of interpretation on 03/13/2021 at 4:56 pm to provider [facility] , who verbally acknowledged these results. Electronically Signed   By: Rolm Baptise M.D.   On: 03/23/2021 16:58        Scheduled Meds:  chlorhexidine  15 mL Mouth Rinse BID   Chlorhexidine Gluconate Cloth   6 each Topical Daily   enoxaparin (LOVENOX) injection  30 mg Subcutaneous Q24H   feeding supplement  237 mL Oral BID BM   mouth rinse  15 mL Mouth Rinse q12n4p   mometasone-formoterol  2 puff Inhalation BID   umeclidinium bromide  1 puff Inhalation Daily   Continuous Infusions:  ceFEPime (MAXIPIME) IV Stopped (03/21/21 0345)   lactated ringers 75 mL/hr at 03/21/21 0858   vancomycin Stopped (03/20/21 1754)     LOS: 2 days    Time spent: 11mins    Kathie Dike, MD [facility]   If 7PM-7AM, please contact night-coverage [email or web address]  03/21/2021, 10:51 AM

## 2021-03-21 NOTE — Consult Note (Signed)
Reason for Consult: Right pneumothorax, status post smallbore chest tube placement in the emergency room Referring Physician: Dr. Griselda Miner is an 75 y.o. male.  HPI: Patient is a 75 year old white male with multiple medical problems, status post VATS procedure in 2016 on the left side for pneumothorax secondary to blebs who now presents with a right pneumothorax that occurred spontaneously.  A small bore chest tube was placed and the patient was admitted to the intensive care unit for further management and treatment.  General surgery has been asked to manage the chest tube.  Past Medical History:  Diagnosis Date   Smoking history     Past Surgical History:  Procedure Laterality Date   PLEURADESIS Left 10/29/2014   Procedure: PLEURADESIS;  Surgeon: Ivin Poot, MD;  Location: [place];  Service: Thoracic;  Laterality: Left;   STAPLING OF BLEBS Left 10/29/2014   Procedure: STAPLING OF BLEBS;  Surgeon: Ivin Poot, MD;  Location: [place];  Service: Thoracic;  Laterality: Left;   VIDEO ASSISTED THORACOSCOPY Left 10/29/2014   Procedure: VIDEO ASSISTED THORACOSCOPY;  Surgeon: Ivin Poot, MD;  Location: [place];  Service: Thoracic;  Laterality: Left;    History reviewed. No pertinent family history.  Social History:  reports that he quit smoking about 28 years ago. His smoking use included cigarettes. He smoked an average of 1.00 packs per day. He has never used smokeless tobacco. He reports that he does not drink alcohol and does not use drugs.  Allergies: No Known Allergies  Medications: I have reviewed the patient's current medications.  Results for orders placed or performed during the hospital encounter of 04/06/2021 (from the past 48 hour(s))  Resp Panel by RT-PCR (Flu A&B, Covid) Nasopharyngeal Swab     Status: None   Collection Time: 03/11/2021  3:53 PM   Specimen: Nasopharyngeal Swab; Nasopharyngeal(NP) swabs in vial transport medium  Result Value Ref Range   SARS  Coronavirus 2 by RT PCR NEGATIVE NEGATIVE    Comment: (NOTE) SARS-CoV-2 target nucleic acids are NOT DETECTED.  The SARS-CoV-2 RNA is generally detectable in upper respiratory specimens during the acute phase of infection. The lowest concentration of SARS-CoV-2 viral copies this assay can detect is 138 copies/mL. A negative result does not preclude SARS-Cov-2 infection and should not be used as the sole basis for treatment or other patient management decisions. A negative result may occur with  improper specimen collection/handling, submission of specimen other than nasopharyngeal swab, presence of viral mutation(s) within the areas targeted by this assay, and inadequate number of viral copies(<138 copies/mL). A negative result must be combined with clinical observations, patient history, and epidemiological information. The expected result is Negative.  Fact Sheet for Patients:  EntrepreneurPulse.com.au  Fact Sheet for Healthcare Providers:  IncredibleEmployment.be  This test is no t yet approved or cleared by the [place] FDA and  has been authorized for detection and/or diagnosis of SARS-CoV-2 by FDA under an Emergency Use Authorization (EUA). This EUA will remain  in effect (meaning this test can be used) for the duration of the COVID-19 declaration under Section 564(b)(1) of the Act, 21 U.S.C.section 360bbb-3(b)(1), unless the authorization is terminated  or revoked sooner.       Influenza A by PCR NEGATIVE NEGATIVE   Influenza B by PCR NEGATIVE NEGATIVE    Comment: (NOTE) The Xpert Xpress SARS-CoV-2/FLU/RSV plus assay is intended as an aid in the diagnosis of influenza from Nasopharyngeal swab specimens and should not be used as  a sole basis for treatment. Nasal washings and aspirates are unacceptable for Xpert Xpress SARS-CoV-2/FLU/RSV testing.  Fact Sheet for Patients: EntrepreneurPulse.com.au  Fact Sheet  for Healthcare Providers: IncredibleEmployment.be  This test is not yet approved or cleared by the [place] FDA and has been authorized for detection and/or diagnosis of SARS-CoV-2 by FDA under an Emergency Use Authorization (EUA). This EUA will remain in effect (meaning this test can be used) for the duration of the COVID-19 declaration under Section 564(b)(1) of the Act, 21 U.S.C. section 360bbb-3(b)(1), unless the authorization is terminated or revoked.  Performed at [facility], [address], [place], [place] [address]   Lactic acid, plasma     Status: None   Collection Time: 03/22/2021  3:59 PM  Result Value Ref Range   Lactic Acid, Venous 1.7 0.5 - 1.9 mmol/L    Comment: Performed at [facility], [address] [place], [place] [address]  Comprehensive metabolic panel     Status: Abnormal   Collection Time: 03/17/2021  3:59 PM  Result Value Ref Range   Sodium 139 135 - 145 mmol/L   Potassium 3.5 3.5 - 5.1 mmol/L   Chloride 99 98 - 111 mmol/L   CO2 31 22 - 32 mmol/L   Glucose, Bld 193 (H) 70 - 99 mg/dL    Comment: Glucose reference range applies only to samples taken after fasting for at least 8 hours.   BUN 30 (H) 8 - 23 mg/dL   Creatinine, Ser 0.87 0.61 - 1.24 mg/dL   Calcium 8.8 (L) 8.9 - 10.3 mg/dL   Total Protein 7.8 6.5 - 8.1 g/dL   Albumin 3.8 3.5 - 5.0 g/dL   AST 21 15 - 41 U/L   ALT 14 0 - 44 U/L   Alkaline Phosphatase 50 38 - 126 U/L   Total Bilirubin 0.6 0.3 - 1.2 mg/dL   GFR, Estimated >60 >60 mL/min    Comment: (NOTE) Calculated using the CKD-EPI Creatinine Equation (2021)    Anion gap 9 5 - 15    Comment: Performed at [facility], [address] [place], [place] [address]  CBC WITH DIFFERENTIAL     Status: Abnormal   Collection Time: 03/18/2021  3:59 PM  Result Value Ref Range   WBC 20.5 (H) 4.0 - 10.5 K/uL   RBC 3.41 (L) 4.22 - 5.81 MIL/uL   Hemoglobin 10.7 (L) 13.0 - 17.0 g/dL   HCT 34.1 (L) 39.0 - 52.0 %   MCV 100.0 80.0 -  100.0 fL   MCH 31.4 26.0 - 34.0 pg   MCHC 31.4 30.0 - 36.0 g/dL   RDW 15.3 11.5 - 15.5 %   Platelets 444 (H) 150 - 400 K/uL   nRBC 0.0 0.0 - 0.2 %   Neutrophils Relative % 61 %   Neutro Abs 12.5 (H) 1.7 - 7.7 K/uL   Lymphocytes Relative 31 %   Lymphs Abs 6.4 (H) 0.7 - 4.0 K/uL   Monocytes Relative 4 %   Monocytes Absolute 0.9 0.1 - 1.0 K/uL   Eosinophils Relative 3 %   Eosinophils Absolute 0.6 (H) 0.0 - 0.5 K/uL   Basophils Relative 0 %   Basophils Absolute 0.1 0.0 - 0.1 K/uL   Immature Granulocytes 1 %   Abs Immature Granulocytes 0.11 (H) 0.00 - 0.07 K/uL    Comment: Performed at [facility], [address] [place],  [address]  Protime-INR     Status: None   Collection Time: 03/14/2021  3:59 PM  Result Value Ref Range   Prothrombin Time 12.9 11.4 - 15.2 seconds   INR 1.0 0.8 - 1.2    Comment: (NOTE) INR goal varies based on device and disease states. Performed at [facility], [address] [place], [place] [address]   APTT     Status: None   Collection Time: 03/13/2021  3:59 PM  Result Value Ref Range   aPTT 30 24 - 36 seconds    Comment: Performed at [facility], [address], [place], [place] [address]  Blood Culture (routine x 2)     Status: None (Preliminary result)   Collection Time: 03/31/2021  3:59 PM   Specimen: BLOOD RIGHT ARM  Result Value Ref Range   Specimen Description      BLOOD RIGHT ARM BOTTLES DRAWN AEROBIC AND ANAEROBIC   Special Requests Blood Culture adequate volume    Culture      NO GROWTH 2 DAYS Performed at [facility], [address] [place], [place] [address]    Report Status PENDING   Blood Culture (routine x 2)     Status: None (Preliminary result)   Collection Time: 03/14/2021  4:15 PM   Specimen: Right Antecubital; Blood  Result Value Ref Range   Specimen Description      RIGHT ANTECUBITAL BOTTLES DRAWN AEROBIC AND ANAEROBIC   Special Requests Blood Culture adequate volume    Culture      NO GROWTH 2 DAYS Performed at [facility], [address], [place], [place] [address]    Report Status PENDING   MRSA Next Gen by PCR, Nasal     Status: None   Collection Time: 04/07/2021  4:29 PM   Specimen: Nasal Mucosa; Nasal Swab  Result Value Ref Range   MRSA by PCR Next Gen NOT DETECTED NOT DETECTED    Comment: (NOTE) The GeneXpert MRSA Assay (FDA approved for NASAL specimens only), is one component of a comprehensive MRSA colonization surveillance program. It is not intended to diagnose MRSA infection nor to guide or monitor treatment for MRSA infections. Test performance is not FDA approved in patients less than 69 years old. Performed at [facility], [address], [place], [place] [address]   Blood gas, arterial     Status: Abnormal   Collection Time: 03/18/2021  4:33 PM  Result Value Ref Range   FIO2 80.00    pH, Arterial 7.220 (L) 7.350 - 7.450   pCO2 arterial 80.2 (HH) 32.0 - 48.0 mmHg    Comment: CRITICAL RESULT CALLED TO, READ BACK BY AND VERIFIED WITH: OAKLEY B @ 1641 ON [id number] Y HENDERSON L.    pO2, Arterial 74.6 (L) 83.0 - 108.0 mmHg   Bicarbonate 26.7 20.0 - 28.0 mmol/L   Acid-Base Excess 4.4 (H) 0.0 - 2.0 mmol/L   O2 Saturation 91.3 %   Patient temperature 37.0    Allens test (pass/fail) PASS PASS    Comment: Performed at [facility], [address], [place], [place] [address]  Lactic acid, plasma     Status: Abnormal   Collection Time: 04/04/2021  5:50 PM  Result Value Ref Range   Lactic Acid, Venous 2.0 (HH) 0.5 - 1.9 mmol/L    Comment: CRITICAL RESULT CALLED TO, READ BACK BY AND VERIFIED WITH: CRAWFORD,H @ 1821 ON 04/07/2021 BY JUW Performed at [facility], [address], [place], [place] [address]   Procalcitonin - Baseline     Status: None   Collection Time: 04/02/2021  5:50 PM  Result Value  Ref Range   Procalcitonin 0.26 ng/mL    Comment:        Interpretation: PCT (Procalcitonin) <= 0.5 ng/mL: Systemic infection (sepsis) is not likely. Local bacterial infection is possible. (NOTE)        Sepsis PCT Algorithm           Lower Respiratory Tract                                      Infection PCT Algorithm    ----------------------------     ----------------------------         PCT < 0.25 ng/mL                PCT < 0.10 ng/mL          Strongly encourage             Strongly discourage   discontinuation of antibiotics    initiation of antibiotics    ----------------------------     -----------------------------       PCT 0.25 - 0.50 ng/mL            PCT 0.10 - 0.25 ng/mL               OR       >80% decrease in PCT            Discourage initiation of                                            antibiotics      Encourage discontinuation           of antibiotics    ----------------------------     -----------------------------         PCT >= 0.50 ng/mL              PCT 0.26 - 0.50 ng/mL               AND        <80% decrease in PCT             Encourage initiation of                                             antibiotics       Encourage continuation           of antibiotics    ----------------------------     -----------------------------        PCT >= 0.50 ng/mL                  PCT > 0.50 ng/mL               AND         increase in PCT                  Strongly encourage                                      initiation of antibiotics    Strongly encourage escalation  of antibiotics                                     -----------------------------                                           PCT <= 0.25 ng/mL                                                 OR                                        > 80% decrease in PCT                                      Discontinue / Do not initiate                                             antibiotics  Performed at [facility], [address], [place], [place] [address]   Lactic acid, plasma     Status: Abnormal   Collection Time: 03/25/2021  7:15 PM  Result Value Ref Range   Lactic Acid, Venous 2.6 (HH) 0.5 - 1.9 mmol/L     Comment: CRITICAL RESULT CALLED TO, READ BACK BY AND VERIFIED WITH: WALKER,T @ 1940 ON 04/07/2021 BY JUW Performed at [facility], [address], [place], [place] [address]   Lactic acid, plasma     Status: Abnormal   Collection Time: 03/27/2021 11:28 PM  Result Value Ref Range   Lactic Acid, Venous 3.4 (HH) 0.5 - 1.9 mmol/L    Comment: CRITICAL RESULT CALLED TO, READ BACK BY AND VERIFIED WITH: ELLER,J @ 2352 ON 03/17/2021 BY JUW Performed at [facility], [address] [place], [place] [address]   Creatinine, serum     Status: None   Collection Time: 03/18/2021 11:28 PM  Result Value Ref Range   Creatinine, Ser 0.95 0.61 - 1.24 mg/dL   GFR, Estimated >60 >60 mL/min    Comment: (NOTE) Calculated using the CKD-EPI Creatinine Equation (2021) Performed at [facility], [address], [place], [place] [address]   Urinalysis, Routine w reflex microscopic Urine, Clean Catch     Status: Abnormal   Collection Time: 04/06/2021 11:45 PM  Result Value Ref Range   Color, Urine YELLOW YELLOW   APPearance HAZY (A) CLEAR   Specific Gravity, Urine 1.018 1.005 - 1.030   pH 6.0 5.0 - 8.0   Glucose, UA 50 (A) NEGATIVE mg/dL   Hgb urine dipstick NEGATIVE NEGATIVE   Bilirubin Urine NEGATIVE NEGATIVE   Ketones, ur 5 (A) NEGATIVE mg/dL   Protein, ur 30 (A) NEGATIVE mg/dL   Nitrite NEGATIVE NEGATIVE   Leukocytes,Ua MODERATE (A) NEGATIVE   RBC / HPF 0-5 0 - 5 RBC/hpf   WBC, UA 21-50 0 - 5 WBC/hpf   Bacteria, UA RARE (  A) NONE SEEN   Mucus PRESENT     Comment: Performed at [facility], [address] [place], [place] [address]  Urine culture     Status: None   Collection Time: 03/25/2021 11:45 PM   Specimen: In/Out Cath Urine  Result Value Ref Range   Specimen Description      IN/OUT CATH URINE Performed at [facility], [address] [place], [place] [address]    Special Requests      NONE Performed at [facility], [address] [place], [place] [address]    Culture      NO GROWTH Performed at  [facility], [place] [address], [place], [place] [address]    Report Status 03/21/2021 FINAL   Procalcitonin     Status: None   Collection Time: 03/20/21  2:11 AM  Result Value Ref Range   Procalcitonin 5.60 ng/mL    Comment:        Interpretation: PCT > 2 ng/mL: Systemic infection (sepsis) is likely, unless other causes are known. (NOTE)       Sepsis PCT Algorithm           Lower Respiratory Tract                                      Infection PCT Algorithm    ----------------------------     ----------------------------         PCT < 0.25 ng/mL                PCT < 0.10 ng/mL          Strongly encourage             Strongly discourage   discontinuation of antibiotics    initiation of antibiotics    ----------------------------     -----------------------------       PCT 0.25 - 0.50 ng/mL            PCT 0.10 - 0.25 ng/mL               OR       >80% decrease in PCT            Discourage initiation of                                            antibiotics      Encourage discontinuation           of antibiotics    ----------------------------     -----------------------------         PCT >= 0.50 ng/mL              PCT 0.26 - 0.50 ng/mL               AND       <80% decrease in PCT              Encourage initiation of                                             antibiotics       Encourage continuation  of antibiotics    ----------------------------     -----------------------------        PCT >= 0.50 ng/mL                  PCT > 0.50 ng/mL               AND         increase in PCT                  Strongly encourage                                      initiation of antibiotics    Strongly encourage escalation           of antibiotics                                     -----------------------------                                           PCT <= 0.25 ng/mL                                                 OR                                        > 80% decrease in  PCT                                      Discontinue / Do not initiate                                             antibiotics  Performed at [facility], [address] [place], [place] [address]   Lactic acid, plasma     Status: Abnormal   Collection Time: 03/20/21  2:11 AM  Result Value Ref Range   Lactic Acid, Venous 2.2 (HH) 0.5 - 1.9 mmol/L    Comment: CRITICAL RESULT CALLED TO, READ BACK BY AND VERIFIED WITH: HARRIS,B @ 0248 ON 03/20/21 BY JUW Performed at [facility], [address], [place], [place] [address]   Basic metabolic panel     Status: Abnormal   Collection Time: 03/20/21  2:11 AM  Result Value Ref Range   Sodium 139 135 - 145 mmol/L   Potassium 4.0 3.5 - 5.1 mmol/L   Chloride 104 98 - 111 mmol/L   CO2 28 22 - 32 mmol/L   Glucose, Bld 142 (H) 70 - 99 mg/dL    Comment: Glucose reference range applies only to samples taken after fasting for at least 8 hours.   BUN 32 (H) 8 - 23 mg/dL   Creatinine, Ser 0.81 0.61 - 1.24 mg/dL   Calcium 8.2 (L) 8.9 - 10.3  mg/dL   GFR, Estimated >60 >60 mL/min    Comment: (NOTE) Calculated using the CKD-EPI Creatinine Equation (2021)    Anion gap 7 5 - 15    Comment: Performed at [facility], [address] [place], [place] [address]  CBC     Status: Abnormal   Collection Time: 03/20/21  2:11 AM  Result Value Ref Range   WBC 11.5 (H) 4.0 - 10.5 K/uL   RBC 2.78 (L) 4.22 - 5.81 MIL/uL   Hemoglobin 8.6 (L) 13.0 - 17.0 g/dL   HCT 27.6 (L) 39.0 - 52.0 %   MCV 99.3 80.0 - 100.0 fL   MCH 30.9 26.0 - 34.0 pg   MCHC 31.2 30.0 - 36.0 g/dL   RDW 14.9 11.5 - 15.5 %   Platelets 254 150 - 400 K/uL   nRBC 0.0 0.0 - 0.2 %    Comment: Performed at [facility], [address] [place], [place] [address]  Procalcitonin     Status: None   Collection Time: 03/21/21  4:32 AM  Result Value Ref Range   Procalcitonin 7.35 ng/mL    Comment:        Interpretation: PCT > 2 ng/mL: Systemic infection (sepsis) is likely, unless other causes are  known. (NOTE)       Sepsis PCT Algorithm           Lower Respiratory Tract                                      Infection PCT Algorithm    ----------------------------     ----------------------------         PCT < 0.25 ng/mL                PCT < 0.10 ng/mL          Strongly encourage             Strongly discourage   discontinuation of antibiotics    initiation of antibiotics    ----------------------------     -----------------------------       PCT 0.25 - 0.50 ng/mL            PCT 0.10 - 0.25 ng/mL               OR       >80% decrease in PCT            Discourage initiation of                                            antibiotics      Encourage discontinuation           of antibiotics    ----------------------------     -----------------------------         PCT >= 0.50 ng/mL              PCT 0.26 - 0.50 ng/mL               AND       <80% decrease in PCT              Encourage initiation of  antibiotics       Encourage continuation           of antibiotics    ----------------------------     -----------------------------        PCT >= 0.50 ng/mL                  PCT > 0.50 ng/mL               AND         increase in PCT                  Strongly encourage                                      initiation of antibiotics    Strongly encourage escalation           of antibiotics                                     -----------------------------                                           PCT <= 0.25 ng/mL                                                 OR                                        > 80% decrease in PCT                                      Discontinue / Do not initiate                                             antibiotics  Performed at [facility], [address], [place], [place] [address]   CBC     Status: Abnormal   Collection Time: 03/21/21  4:32 AM  Result Value Ref Range   WBC 12.5 (H) 4.0 - 10.5 K/uL   RBC 2.57 (L)  4.22 - 5.81 MIL/uL   Hemoglobin 7.8 (L) 13.0 - 17.0 g/dL   HCT 25.6 (L) 39.0 - 52.0 %   MCV 99.6 80.0 - 100.0 fL   MCH 30.4 26.0 - 34.0 pg   MCHC 30.5 30.0 - 36.0 g/dL   RDW 15.5 11.5 - 15.5 %   Platelets 282 150 - 400 K/uL   nRBC 0.0 0.0 - 0.2 %    Comment: Performed at [facility], [address] [place], [place] [address]  Lactic acid, plasma     Status: None   Collection Time: 03/21/21  4:32 AM  Result Value Ref Range   Lactic Acid, Venous 0.7 0.5 - 1.9 mmol/L    Comment: Performed  at [facility], [address], [place], [place] [address]  Renal function panel     Status: Abnormal   Collection Time: 03/21/21  4:32 AM  Result Value Ref Range   Sodium 140 135 - 145 mmol/L   Potassium 3.9 3.5 - 5.1 mmol/L   Chloride 106 98 - 111 mmol/L   CO2 30 22 - 32 mmol/L   Glucose, Bld 90 70 - 99 mg/dL    Comment: Glucose reference range applies only to samples taken after fasting for at least 8 hours.   BUN 24 (H) 8 - 23 mg/dL   Creatinine, Ser 0.84 0.61 - 1.24 mg/dL   Calcium 8.0 (L) 8.9 - 10.3 mg/dL   Phosphorus 2.5 2.5 - 4.6 mg/dL   Albumin 2.6 (L) 3.5 - 5.0 g/dL   GFR, Estimated >60 >60 mL/min    Comment: (NOTE) Calculated using the CKD-EPI Creatinine Equation (2021)    Anion gap 4 (L) 5 - 15    Comment: Performed at [facility], [address] [place], [place] [address]  Vitamin B12     Status: None   Collection Time: 03/21/21  4:33 AM  Result Value Ref Range   Vitamin B-12 264 180 - 914 pg/mL    Comment: (NOTE) This assay is not validated for testing neonatal or myeloproliferative syndrome specimens for Vitamin B12 levels. Performed at [facility], [address], [place], [place] [address]   Folate     Status: None   Collection Time: 03/21/21  4:33 AM  Result Value Ref Range   Folate 11.1 >5.9 ng/mL    Comment: Performed at [facility], [address], [place], [place] [address]  Iron and TIBC     Status: Abnormal   Collection Time: 03/21/21  4:33 AM  Result Value  Ref Range   Iron 52 45 - 182 ug/dL   TIBC 198 (L) 250 - 450 ug/dL   Saturation Ratios 26 17.9 - 39.5 %   UIBC 146 ug/dL    Comment: Performed at [facility], [address] [place], [place] [address]  Ferritin     Status: None   Collection Time: 03/21/21  4:33 AM  Result Value Ref Range   Ferritin 191 24 - 336 ng/mL    Comment: Performed at [facility], [address] [place], [place] [address]  Reticulocytes     Status: Abnormal   Collection Time: 03/21/21  4:33 AM  Result Value Ref Range   Retic Ct Pct 2.0 0.4 - 3.1 %   RBC. 2.53 (L) 4.22 - 5.81 MIL/uL   Retic Count, Absolute 50.3 19.0 - 186.0 K/uL   Immature Retic Fract 14.7 2.3 - 15.9 %    Comment: Performed at [facility], [address] [place], [place] [address]    DG Chest Port 1 View  Result Date: 03/20/2021 CLINICAL DATA:  Pneumothorax.  Former smoker. EXAM: PORTABLE CHEST 1 VIEW COMPARISON:  Chest x-rays dated 04/10/2021. FINDINGS: RIGHT-sided chest tube is stable in position. No residual pneumothorax seen. Lungs are hyperexpanded. Chronic bronchitic changes centrally. Coarse lung markings are seen bilaterally. No confluent opacity to suggest a superimposed pneumonia or pulmonary edema. No pleural effusion is seen. Heart size and mediastinal contours are within normal limits. IMPRESSION: 1. RIGHT-sided chest tube is stable in position. No residual pneumothorax seen. 2. No active disease. No evidence of pneumonia or pulmonary edema. 3. Hyperexpanded lungs indicating COPD. Associated chronic bronchitic changes and evidence of chronic interstitial lung disease/fibrosis. Electronically Signed  By: Franki Cabot M.D.   On: 03/20/2021 05:54   DG Chest Port 1 View  Result Date: 03/27/2021 CLINICAL DATA:  Right-sided chest tube placement EXAM: PORTABLE CHEST 1 VIEW COMPARISON:  04/06/2021, 4:27 p.m. FINDINGS: Interval placement of a right-sided pigtail chest tube with re-expansion of the right lung and resolution of a previously seen  approximately 50% right pneumothorax. There is no significant residual pneumothorax. Small volume subcutaneous emphysema about the right chest wall. The left lung is normally aerated. Heart and mediastinum are unremarkable. IMPRESSION: Interval placement of a right-sided pigtail chest tube with re-expansion of the right lung and resolution of a previously seen approximately 50% right pneumothorax. There is no significant residual pneumothorax. Electronically Signed   By: Eddie Candle M.D.   On: 04/04/2021 18:32   DG Chest Port 1 View  Result Date: 03/27/2021 CLINICAL DATA:  Questionable sepsis.  Shortness of breath EXAM: PORTABLE CHEST 1 VIEW COMPARISON:  12/25/2014 FINDINGS: There is a large right pneumothorax, approximately 50%. There is hyperinflation of the lungs compatible with COPD. No confluent opacity on the left. Heart is normal size. Aortic calcifications. IMPRESSION: Emphysema. Large right pneumothorax, approximately 50%. Critical Value/emergent results were called by telephone at the time of interpretation on 04/08/2021 at 4:56 pm to provider [facility] , who verbally acknowledged these results. Electronically Signed   By: Rolm Baptise M.D.   On: 04/04/2021 16:58    ROS:  Pertinent items are noted in HPI.  Blood pressure (!) 142/67, pulse 81, temperature 98.4 F (36.9 C), temperature source Oral, resp. rate 16, height 5\' 7"  (1.702 m), weight 45.9 kg, SpO2 97 %. Physical Exam: Pleasant white male no acute distress Head is normocephalic, atraumatic Heart examination reveals a regular rate and rhythm without S3, S4, murmurs A Pleur-evac is present in the right chest with continuous airleak.  It is at -20 mmHg pressure. Chest x-ray reviewed  Assessment/Plan: Impression: Spontaneous right pneumothorax most likely secondary to COPD, lung blebs.  Patient is not a surgical candidate as per cardiothoracic surgery and family does not want any further surgical intervention.  Discussed with Dr.  Roderic Palau.  Palliative care consult is in the works.  Continue current chest tube settings.  We will follow with you.  Aviva Signs 03/21/2021, 11:26 AM

## 2021-03-21 NOTE — TOC Initial Note (Addendum)
Transition of Care [facility]) - Initial/Assessment Note    Patient Details  Name: Alan Huff MRN: [id number] Date of Birth: [birth year]  Transition [facility]) CM/SW Contact:    Ihor Gully, LCSW Phone Number: 03/21/2021, 12:14 PM  Clinical Narrative:                 Patient is a LTC resident at [facility]. Per daughter, Alan Huff, patient is blind, can stand/pivot, self-propels in w/c. Total assistance with bathing, dressing, grooming. Feeds self with set up assistance.  Interested in other LTC options closer to area. Referral made to facilities in [facility].  Patient is fully vaccinated and received booster for COVID-19. Vaccinations received at [employer].  Expected Discharge Plan: Skilled Nursing Facility Barriers to Discharge: Continued Medical Work up   Patient Goals and CMS Choice Patient states their goals for this hospitalization and ongoing recovery are:: return to LTC      Expected Discharge Plan and Services Expected Discharge Plan: [place]       Living arrangements for the past 2 months: [place]                                      Prior Living Arrangements/Services Living arrangements for the past 2 months: [place] Lives with:: Facility Resident Patient language and need for interpreter reviewed:: Yes Do you feel safe going back to the place where you live?: Yes      Need for Family Participation in Patient Care: Yes (Comment) Care giver support system in place?: Yes (comment)   Criminal Activity/Legal Involvement Pertinent to Current Situation/Hospitalization: No - Comment as needed  Activities of Daily Living Home Assistive Devices/Equipment: Blood pressure cuff, Bedside commode/3-in-1, Hospital bed, Oxygen, Raised toilet seat with rails, Scales, Wheelchair ADL Screening (condition at time of admission) Patient's cognitive ability adequate to safely complete daily activities?:  No Is the patient deaf or have difficulty hearing?: Yes Does the patient have difficulty seeing, even when wearing glasses/contacts?: No Does the patient have difficulty concentrating, remembering, or making decisions?: Yes Patient able to express need for assistance with ADLs?: No Does the patient have difficulty dressing or bathing?: Yes Independently performs ADLs?: No Communication: Independent Dressing (OT): Needs assistance Is this a change from baseline?: Pre-admission baseline Grooming: Needs assistance Is this a change from baseline?: Pre-admission baseline Feeding: Independent Is this a change from baseline?: Pre-admission baseline Bathing: Needs assistance Is this a change from baseline?: Pre-admission baseline Toileting: Needs assistance Is this a change from baseline?: Pre-admission baseline In/Out Bed: Needs assistance Is this a change from baseline?: Pre-admission baseline Walks in Home: Dependent Is this a change from baseline?: Pre-admission baseline Does the patient have difficulty walking or climbing stairs?: Yes Weakness of Legs: Both Weakness of Arms/Hands: Both  Permission Sought/Granted Permission sought to share information with : Family Supports    Share Information with NAME: Alan Huff, daughter           Emotional Assessment       Orientation: : Oriented to Self Alcohol / Substance Use: Not Applicable Psych Involvement: No (comment)  Admission diagnosis:  Acute respiratory distress [R06.03] Pneumothorax, right [J93.9] Pneumothorax [J93.9] Patient Active Problem List   Diagnosis Date Noted   Pneumothorax, right 03/28/2021   COPD (chronic obstructive pulmonary disease) ([place]) 04/07/2021   Persistent cognitive impairment 02/28/2019   Lung blebs ([place]) 10/29/2014   Persistent air leak 10/28/2014  PCP:  [place]:  No Pharmacies Listed    Social Determinants of Health (SDOH) Interventions    Readmission Risk  Interventions No flowsheet data found.

## 2021-03-21 NOTE — NC FL2 (Signed)
[place] LEVEL OF CARE SCREENING TOOL     IDENTIFICATION  Patient Name: Alan Huff Birthdate: Jan 19, 1946 Sex: male Admission Date (Current Location): 03/30/2021  [facility] and [place] Number:  [employer] and Address:  [place] [address] [place]      Provider Number: [id number]  Attending Physician Name and Address:  Kathie Dike, MD  Relative Name and Phone Number:  [name] (Daughter)   [phone]    Current Level of Care: Hospital Recommended Level of Care: [place] Prior Approval Number:    Date Approved/Denied:   PASRR Number: [id number]  Discharge Plan: SNF    Current Diagnoses: Patient Active Problem List   Diagnosis Date Noted   Pneumothorax, right 03/21/2021   COPD (chronic obstructive pulmonary disease) ([place]) 03/29/2021   Persistent cognitive impairment 02/28/2019   Lung blebs ([place]) 10/29/2014   Persistent air leak 10/28/2014    Orientation RESPIRATION BLADDER Height & Weight     Self  O2 (3L) Incontinent Weight: 101 lb 3.1 oz (45.9 kg) Height:  5\' 7"  (170.2 cm)  BEHAVIORAL SYMPTOMS/MOOD NEUROLOGICAL BOWEL NUTRITION STATUS      Incontinent Diet (heart healthy)  AMBULATORY STATUS COMMUNICATION OF NEEDS Skin   Total Care (per daughter, can stand/pivot. Self-propels in w/c) Verbally Normal                       Personal Care Assistance Level of Assistance  Bathing, Dressing, Feeding Bathing Assistance: Maximum assistance Feeding assistance: Limited assistance Dressing Assistance: Maximum assistance     Functional Limitations Info  Sight, Hearing, Speech Sight Info: Impaired Hearing Info: Adequate Speech Info: Adequate    SPECIAL CARE FACTORS FREQUENCY                       Contractures Contractures Info: Not present    Additional Factors Info  Code Status, Allergies Code Status Info: DNR Allergies Info: NKA           Current  Medications (03/21/2021):  This is the current hospital active medication list Current Facility-Administered Medications  Medication Dose Route Frequency Provider Last Rate Last Admin   acetaminophen (TYLENOL) tablet 650 mg  650 mg Oral Q6H PRN Elgergawy, Silver Huguenin, MD       Or   acetaminophen (TYLENOL) suppository 650 mg  650 mg Rectal Q6H PRN Elgergawy, Silver Huguenin, MD       ceFEPIme (MAXIPIME) 2 g in sodium chloride 0.9 % 100 mL IVPB  2 g Intravenous Q12H Elgergawy, Silver Huguenin, MD   Stopped at 03/21/21 0345   chlorhexidine (PERIDEX) 0.12 % solution 15 mL  15 mL Mouth Rinse BID Elgergawy, Silver Huguenin, MD   15 mL at 03/21/21 0827   Chlorhexidine Gluconate Cloth 2 % PADS 6 each  6 each Topical Daily Elgergawy, Silver Huguenin, MD   6 each at 03/21/21 0827   enoxaparin (LOVENOX) injection 30 mg  30 mg Subcutaneous Q24H Kathie Dike, MD   30 mg at 03/20/21 2100   feeding supplement (ENSURE ENLIVE / ENSURE PLUS) liquid 237 mL  237 mL Oral BID BM Elgergawy, Silver Huguenin, MD   237 mL at 03/21/21 0827   ipratropium-albuterol (DUONEB) 0.5-2.5 (3) MG/3ML nebulizer solution 3 mL  3 mL Nebulization Q6H PRN Elgergawy, Silver Huguenin, MD   3 mL at 03/20/21 0444   lactated ringers infusion   Intravenous Continuous Elgergawy, Silver Huguenin, MD 75 mL/hr at 03/21/21 [phone]  Infusion Verify at 03/21/21 0858   MEDLINE mouth rinse  15 mL Mouth Rinse q12n4p Elgergawy, Silver Huguenin, MD   15 mL at 03/21/21 1141   mometasone-formoterol (DULERA) 200-5 MCG/ACT inhaler 2 puff  2 puff Inhalation BID Elgergawy, Silver Huguenin, MD   2 puff at 03/21/21 0824   morphine 2 MG/ML injection 2 mg  2 mg Intravenous Q4H PRN Elgergawy, Silver Huguenin, MD   2 mg at 03/21/21 0940   umeclidinium bromide (INCRUSE ELLIPTA) 62.5 MCG/INH 1 puff  1 puff Inhalation Daily Elgergawy, Silver Huguenin, MD   1 puff at 03/21/21 0825     Discharge Medications: Please see discharge summary for a list of discharge medications.  Relevant Imaging Results:  Relevant Lab Results:   Additional  Information SSN [id number] [address], Clydene Pugh, LCSW

## 2021-03-22 ENCOUNTER — Inpatient Hospital Stay (HOSPITAL_COMMUNITY): Payer: No Typology Code available for payment source

## 2021-03-22 DIAGNOSIS — J9611 Chronic respiratory failure with hypoxia: Secondary | ICD-10-CM | POA: Diagnosis present

## 2021-03-22 DIAGNOSIS — R0603 Acute respiratory distress: Secondary | ICD-10-CM | POA: Diagnosis not present

## 2021-03-22 DIAGNOSIS — R4189 Other symptoms and signs involving cognitive functions and awareness: Secondary | ICD-10-CM

## 2021-03-22 DIAGNOSIS — Z66 Do not resuscitate: Secondary | ICD-10-CM

## 2021-03-22 DIAGNOSIS — J439 Emphysema, unspecified: Secondary | ICD-10-CM | POA: Diagnosis not present

## 2021-03-22 DIAGNOSIS — J449 Chronic obstructive pulmonary disease, unspecified: Secondary | ICD-10-CM | POA: Diagnosis not present

## 2021-03-22 DIAGNOSIS — J939 Pneumothorax, unspecified: Secondary | ICD-10-CM | POA: Diagnosis not present

## 2021-03-22 LAB — BASIC METABOLIC PANEL
Anion gap: 6 (ref 5–15)
BUN: 25 mg/dL — ABNORMAL HIGH (ref 8–23)
CO2: 30 mmol/L (ref 22–32)
Calcium: 8.5 mg/dL — ABNORMAL LOW (ref 8.9–10.3)
Chloride: 103 mmol/L (ref 98–111)
Creatinine, Ser: 0.8 mg/dL (ref 0.61–1.24)
GFR, Estimated: >60 mL/min (ref >60)
Glucose, Bld: 95 mg/dL (ref 70–99)
Potassium: 3.6 mmol/L (ref 3.5–5.1)
Sodium: 139 mmol/L (ref 135–145)

## 2021-03-22 LAB — CBC
HCT: 27.3 % — ABNORMAL LOW (ref 39.0–52.0)
Hemoglobin: 8.5 g/dL — ABNORMAL LOW (ref 13.0–17.0)
MCH: 30.7 pg (ref 26.0–34.0)
MCHC: 31.1 g/dL (ref 30.0–36.0)
MCV: 98.6 fL (ref 80.0–100.0)
Platelets: 286 10*3/uL (ref 150–400)
RBC: 2.77 MIL/uL — ABNORMAL LOW (ref 4.22–5.81)
RDW: 15 % (ref 11.5–15.5)
WBC: 10.6 10*3/uL — ABNORMAL HIGH (ref 4.0–10.5)
nRBC: 0 % (ref 0.0–0.2)

## 2021-03-22 MED ORDER — TAMSULOSIN HCL 0.4 MG PO CAPS
0.4000 mg | ORAL_CAPSULE | Freq: Every day | ORAL | Status: DC
  Administered 2021-03-22 – 2021-04-03 (×13): 0.4 mg via ORAL
  Filled 2021-03-22 (×13): qty 1

## 2021-03-22 MED ORDER — MIRTAZAPINE 15 MG PO TABS
7.5000 mg | ORAL_TABLET | Freq: Every day | ORAL | Status: DC
  Administered 2021-03-22 – 2021-04-03 (×13): 7.5 mg via ORAL
  Filled 2021-03-22 (×13): qty 1

## 2021-03-22 NOTE — Progress Notes (Signed)
  Subjective: Patient has no complaints.  Objective: Vital signs in last 24 hours: Temp:  [98.3 F (36.8 C)-98.8 F (37.1 C)] 98.6 F (37 C) (07/12 0722) Pulse Rate:  [67-118] 86 (07/12 0800) Resp:  [13-23] 21 (07/12 0800) BP: (103-161)/(37-78) 161/78 (07/12 0800) SpO2:  [89 %-100 %] 94 % (07/12 0800) Last BM Date: 03/22/21  Intake/Output from previous day: 07/11 0701 - 07/12 0700 In: 2402.9 [P.O.:360; I.V.:1842.9; IV Piggyback:200] Out: 1964 [Urine:1750; Chest Tube:214] Intake/Output this shift: Total I/O In: 456.1 [P.O.:240; I.V.:216.1] Out: 1475 [Urine:1475]  General appearance: alert, cooperative, and no distress Resp: Chest tube with ongoing air leak.  Mild subcutaneous emphysema along the right chest wall.  Lab Results:  Recent Labs    03/21/21 0432 03/22/21 0508  WBC 12.5* 10.6*  HGB 7.8* 8.5*  HCT 25.6* 27.3*  PLT 282 286   BMET Recent Labs    03/21/21 0432 03/22/21 0508  NA 140 139  K 3.9 3.6  CL 106 103  CO2 30 30  GLUCOSE 90 95  BUN 24* 25*  CREATININE 0.84 0.80  CALCIUM 8.0* 8.5*   PT/INR Recent Labs    04/07/2021 1559  LABPROT 12.9  INR 1.0    Studies/Results: DG Chest Port 1 View  Result Date: 03/22/2021 CLINICAL DATA:  Right chest tube.  Follow-up pneumothorax. EXAM: PORTABLE CHEST 1 VIEW COMPARISON:  03/20/2021 FINDINGS: Right pigtail thoracostomy tube remains in place. Tiny amount of pleural air remains along the lateral margin. Subcutaneous air remains visible. Heart size is normal. Mediastinal shadows are otherwise normal. There is mild atelectasis at the left lung base. Emphysema and pulmonary scarring as seen previously. IMPRESSION: Right chest tube remains in place. Small amount of pleural air in the lateral pleural space. Subcutaneous emphysema. Mild left base atelectasis. Underlying emphysema pulmonary scarring. Electronically Signed   By: Nelson Chimes M.D.   On: 03/22/2021 07:57    Anti-infectives: Anti-infectives (From  admission, onward)    Start     Dose/Rate Route Frequency Ordered Stop   03/20/21 1700  vancomycin (VANCOREADY) IVPB 750 mg/150 mL  Status:  Discontinued        750 mg 150 mL/hr over 60 Minutes Intravenous Every 24 hours 03/22/2021 1635 03/21/21 1059   03/20/21 0400  ceFEPIme (MAXIPIME) 2 g in sodium chloride 0.9 % 100 mL IVPB        2 g 200 mL/hr over 30 Minutes Intravenous Every 12 hours 03/18/2021 1629     03/23/2021 1600  vancomycin (VANCOCIN) IVPB 1000 mg/200 mL premix        1,000 mg 200 mL/hr over 60 Minutes Intravenous  Once 03/21/2021 1553 03/18/2021 1757   03/30/2021 1600  ceFEPIme (MAXIPIME) 2 g in sodium chloride 0.9 % 100 mL IVPB        2 g 200 mL/hr over 30 Minutes Intravenous  Once 03/14/2021 1553 03/16/2021 1648       Assessment/Plan: Impression: Spontaneous right pneumothorax secondary to emphysema.  Ongoing airleak noted.  Awaiting decision from family as to plan of care.  LOS: 3 days    Aviva Signs 03/22/2021

## 2021-03-22 NOTE — Progress Notes (Signed)
This chaplain responded to RN consult for spiritual care.    The Pt. daughter-Shelli is at the Pt. bedside.  The chaplain introduced herself and offered spiritual care as needed.

## 2021-03-22 NOTE — Progress Notes (Addendum)
Daily Progress Note   Patient Name: Alan Huff       Date: 03/22/2021 DOB: May 31, 1946  Age: 75 y.o. MRN#: [id number] Attending Physician: Kathie Dike, MD [facility] Admit Date: 04/10/2021  Reason for Consultation/Follow-up: Establishing goals of care  Subjective: Patient awake, eating, but intake appears poor. States his breathing feels better than it did yesterday. He is not oriented to place and is not able to discuss goals of care.  Spoke with daughter Alan Huff- she initially requested to meet with all providers tomorrow with additional family present. She then spoke with Dr. Arnoldo Morale and Dr. Roderic Palau and plan now is for transfer to [facility] facility for pulmonology/CT evaluation to see if there are less invasive options for his pulmonary issues. They do not want surgery. If no other options, then consider comfort measures.   Review of Systems  Unable to perform ROS: Dementia   Length of Stay: 3  Current Medications: Scheduled Meds:  . chlorhexidine  15 mL Mouth Rinse BID  . Chlorhexidine Gluconate Cloth  6 each Topical Daily  . enoxaparin (LOVENOX) injection  30 mg Subcutaneous Q24H  . feeding supplement  237 mL Oral BID BM  . mouth rinse  15 mL Mouth Rinse q12n4p  . mirtazapine  7.5 mg Oral QHS  . mometasone-formoterol  2 puff Inhalation BID  . tamsulosin  0.4 mg Oral Daily  . umeclidinium bromide  1 puff Inhalation Daily    Continuous Infusions: . lactated ringers 75 mL/hr at 03/22/21 1047    PRN Meds: acetaminophen **OR** acetaminophen, ipratropium-albuterol, morphine injection  Physical Exam Vitals and nursing note reviewed.  Constitutional:      Comments: Frail, cachetic, disheveled  Pulmonary:     Effort: Pulmonary effort is normal.   Skin:    General: Skin is warm and dry.     Coloration: Skin is pale.  Neurological:     Mental Status: He is disoriented.            Vital Signs: BP (!) 103/48   Pulse 76   Temp 98.3 F (36.8 C) (Oral)   Resp (!) 23   Ht 5\' 7"  (1.702 m)   Wt 45.9 kg   SpO2 97%   BMI 15.85 kg/m  SpO2: SpO2: 97 % O2 Device: O2 Device: Nasal Cannula O2  Flow Rate: O2 Flow Rate (L/min): 3 L/min  Intake/output summary:  Intake/Output Summary (Last 24 hours) at 03/22/2021 1252 Last data filed at 03/22/2021 3875 Gross per 24 hour  Intake 2245.81 ml  Output 2939 ml  Net -693.19 ml   LBM: Last BM Date: 03/22/21 Baseline Weight: Weight: 52.2 kg Most recent weight: Weight: 45.9 kg       Palliative Assessment/Data: PPS: 20%      Patient Active Problem List   Diagnosis Date Noted  . Chronic respiratory failure with hypoxia ([place]) 03/22/2021  . Pneumothorax, right 04/04/2021  . COPD (chronic obstructive pulmonary disease) ([place]) 03/30/2021  . Persistent cognitive impairment 02/28/2019  . Lung blebs ([place]) 10/29/2014  . Persistent air leak 10/28/2014    Palliative Care Assessment & Plan   Patient Profile: 75 y.o. male  with past medical history of dementia, oxygen dependent COPD, current resident at the [facility], pneumothorax s/p VATS admitted on 04/07/2021 with respiratory distress d/t large R sided pneumothorax. He is s/p chest tube placement. Has continued air leak. Not a surgical candidate and surgery is not in line with goals of care. Palliative consulted for further goals of care discussion.   Assessment/Recommendations/Plan  Transfer to [facility] for additional input PMT will follow at [facility] once recommendations have been made regarding nonsurgical options for pneumothorax that has resolved however chest tube with persistent air leak  Goals of Care and Additional Recommendations: Limitations on Scope of Treatment: No Surgical Procedures  Code Status: DNR  Prognosis:  Unable to  determine  Discharge Planning: To Be Determined  Care plan was discussed with patient's daughter and care team.  Thank you for allowing the Palliative Medicine Team to assist in the care of this patient.   Total time: 39 mins Greater than 50%  of this time was spent counseling and coordinating care related to the above assessment and plan.  Mariana Kaufman, AGNP-C Palliative Medicine   Please contact Palliative Medicine Team phone at [phone] for questions and concerns.

## 2021-03-22 NOTE — Consult Note (Signed)
Consultation Note Date: 03/22/2021   Patient Name: Alan Huff  DOB: 05/05/1946  MRN: [id number]  Age / Sex: 75 y.o., male  [facility] Referring Physician: Kathie Dike, MD  Reason for Consultation: Establishing goals of care  HPI/Patient Profile: 75 y.o. male  with past medical history of dementia, oxygen dependent COPD, current resident at the [facility], pneumothorax s/p VATS admitted on 04/04/2021 with respiratory distress d/t large R sided pneumothorax. He is s/p chest tube placement. Has continued air leak. Not a surgical candidate and surgery is not in line with goals of care. Palliative consulted for further goals of care discussion.   Clinical Assessment and Goals of Care: Met with patient's daughterRocco Serene. Discussed patient's life review and current medical situation and comorbidities.  Discussed that if patient does not improve then will need to make decision regarding option of pulling chest tube and providing comfort measures only vs placement of tube with flutter valve and likely will need Hospice at discharge. Shelli receptive to information- she is going to begin discussions with her other family members. No changes to current plan of care.   Primary Decision Maker NEXT OF KIN- patient's daughter [name]    SUMMARY OF RECOMMENDATIONS -[name] is considering options of transitioning patient to full comfort vs placement of tube with valve and d/c to SNF with Hospice- she is aware that patient is likely at end of life- but she is having good quality time with him for now. If she and her family were to pursue tube with flutter valve placement and  return to SNF then she would want him placed closer to her home   Code Status/Advance Care Planning: DNR  Additional Recommendations (Limitations, Scope, Preferences): Full Scope Treatment  Psycho-social/Spiritual:  Desire  for further Chaplaincy support:no  Prognosis:   Unable to determine- if transitioned to full comfort likely days-weeks, with ongoing care likely months  Discharge Planning: To Be Determined  Primary Diagnoses: Present on Admission:  Lung blebs ([place])  Persistent cognitive impairment   I have reviewed the medical record, interviewed the patient and family, and examined the patient. The following aspects are pertinent.  Past Medical History:  Diagnosis Date   Smoking history    Social History   Socioeconomic History   Marital status: Single    Spouse name: Not on file   Number of children: Not on file   Years of education: Not on file   Highest education level: Not on file  Occupational History   Not on file  Tobacco Use   Smoking status: Former    Packs/day: 1.00    Pack years: 0.00    Types: Cigarettes    Quit date: 09/11/1992    Years since quitting: 28.5   Smokeless tobacco: Never  Substance and Sexual Activity   Alcohol use: No    Alcohol/week: 0.0 standard drinks   Drug use: No   Sexual activity: Not on file  Other Topics Concern   Not on file  Social History Narrative  Not on file   Social Determinants of Health   Financial Resource Strain: Not on file  Food Insecurity: Not on file  Transportation Needs: Not on file  Physical Activity: Not on file  Stress: Not on file  Social Connections: Not on file   Scheduled Meds:  chlorhexidine  15 mL Mouth Rinse BID   Chlorhexidine Gluconate Cloth  6 each Topical Daily   enoxaparin (LOVENOX) injection  30 mg Subcutaneous Q24H   feeding supplement  237 mL Oral BID BM   mouth rinse  15 mL Mouth Rinse q12n4p   mometasone-formoterol  2 puff Inhalation BID   umeclidinium bromide  1 puff Inhalation Daily   Continuous Infusions:  ceFEPime (MAXIPIME) IV Stopped (03/22/21 0409)   lactated ringers 75 mL/hr at 03/22/21 0538   PRN Meds:.acetaminophen **OR** acetaminophen, ipratropium-albuterol, morphine  injection Medications Prior to Admission:  Prior to Admission medications   Medication Sig Start Date End Date Taking? Authorizing Provider  albuterol (ACCUNEB) 1.25 MG/3ML nebulizer solution Take 1 ampule by nebulization every 4 (four) hours as needed for wheezing or shortness of breath.   Yes [provider]  albuterol (PROVENTIL HFA;VENTOLIN HFA) 108 (90 BASE) MCG/ACT inhaler Inhale 2 puffs into the lungs every 6 (six) hours as needed for wheezing or shortness of breath.   Yes [provider]  budesonide-formoterol (SYMBICORT) 160-4.5 MCG/ACT inhaler Inhale 2 puffs into the lungs 2 (two) times daily.   Yes [provider]  Cholecalciferol (VITAMIN D3) 50 MCG (2000 UT) TABS Take 1 tablet by mouth daily.   Yes [provider]  melatonin 5 MG TABS Take 5 mg by mouth at bedtime.   Yes [provider]  mirtazapine (REMERON) 15 MG tablet Take 15 mg by mouth at bedtime. 02/15/21  Yes [provider]  Multiple Vitamin (MULTIVITAMIN) tablet Take 1 tablet by mouth daily.   Yes [provider]  OXYGEN Inhale 2 L into the lungs daily.   Yes [provider]  senna (SENOKOT) 8.6 MG TABS tablet Take 1 tablet by mouth daily as needed for mild constipation.   Yes [provider]  tamsulosin (FLOMAX) 0.4 MG CAPS capsule Take 0.4 mg by mouth daily. 02/17/21  Yes [provider]  tiotropium (SPIRIVA) 18 MCG inhalation capsule Place 18 mcg into inhaler and inhale daily.   Yes [provider]   No Known Allergies Review of Systems  Physical Exam  Vital Signs: BP (!) 143/54   Pulse 81   Temp 98.6 F (37 C) (Oral)   Resp (!) 22   Ht $R'5\' 7"'Dh$  (1.702 m)   Wt 45.9 kg   SpO2 98%   BMI 15.85 kg/m  Pain Scale: 0-10   Pain Score: 9    SpO2: SpO2: 98 % O2 Device:SpO2: 98 % O2 Flow Rate: .O2 Flow Rate (L/min): 3 L/min  IO: Intake/output summary:  Intake/Output Summary (Last 24 hours) at 03/22/2021 0748 Last data  filed at 03/22/2021 0716 Gross per 24 hour  Intake 2402.92 ml  Output 3439 ml  Net -1036.08 ml    LBM: Last BM Date: 03/21/21 Baseline Weight: Weight: 52.2 kg Most recent weight: Weight: 45.9 kg     Palliative Assessment/Data:     Thank you for this consult. Palliative medicine will continue to follow and assist as needed.   Time In: 1430 Time Out: 1548 Time Total: 88 mins Greater than 50%  of this time was spent counseling and coordinating care related to the above assessment and  plan.  Signed by: Mariana Kaufman, AGNP-C Palliative Medicine    Please contact Palliative Medicine Team phone at [phone] for questions and concerns.  For individual provider: See Shea Evans

## 2021-03-22 NOTE — Progress Notes (Signed)
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  He was found to have large right-sided pneumothorax and had chest tube placed in the emergency room.  Pneumothorax resolved after chest tube placement, but has had a persistent air leak since then.  Palliative care following to help address goals of care.  General surgery is also been following for chest tube management.  At this point, does not appear to be reasonable to remove chest tube due to persistent air leak.  Patient is being transferred to [facility] for cardiothoracic surgery/pulmonology consultation to see if there are any less invasive options to help manage pneumothorax.   Assessment & Plan:   Active Problems:   Lung blebs (HCC)   Persistent cognitive impairment   Pneumothorax, right   COPD (chronic obstructive pulmonary disease) (HCC)   Chronic respiratory failure with hypoxia (HCC)   Right-sided pneumothorax -History of pulm blebs in the past status post VATS/minithoracotomy and resection of apical and lingular blebs with talc pleurodesis and closure of bronchopleural fistula in 2016 -Patient had chest tube placed in the emergency room on 7/9 -Follow-up chest x-rays have shown resolution of pneumothorax -General surgery consulted to assist with management of chest tube -patient continues to have air leak and it does not appear to be reasonable to remove chest tube at this point -Family is requesting further input from cardiothoracic surgery regarding any further less invasive options to help manage pneumothorax -Ultimately, if there are no feasible options to manage pneumothorax, they may transition to comfort care, but it would be helpful to have  this input prior to making any decisions.  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 -No wheezing or shortness of breath at this time -He is chronically on 4 L of oxygen  SIRS -Patient was tachypneic and tachycardic on admission -He was noted to have elevated lactic acid -Lactic acid has been trending down with IV fluids -Currently on broad-spectrum antibiotics -No evidence of pneumonia on chest x-ray -Blood/urine cultures have shown no growth and he has been afebrile -MRSA PCR negative -discontinue vancomycin and cefepime  Protein calorie malnutrition -BMI 15 -Nutrition consult  Dementia -Continue supportive care  Anemia -Daughter reports that he was previously on iron supplements -No signs of bleeding at present -Suspected degree of iron deficiency -Hemoglobin currently stable -continue to follow  Goals of care -Patient is a long-term resident of a skilled nursing facility and is wheelchair dependent -Patient has advanced COPD and has poor functional reserve -Clinically, he has improved with chest tube placement, he is able to interact with his family, he is eating and drinking and appears to be breathing comfortably -With persistent air leak, removal of chest tube at this point does not seem to be feasible -Palliative care is following to help further address goals of care -Family has some difficult decisions to make regarding further management of chest tube -They would like to know all available options including removal of chest tube with transition to comfort care versus leaving chest tube and and placing a flutter valve, versus other less invasive procedures -After discussing with general surgery, patient's family and palliative care, it was felt that best option would be to transfer patient to [facility] to be seen by cardiothoracic surgery  so that family can make an informed decision regarding further goals   DVT prophylaxis: enoxaparin (LOVENOX) injection  30 mg Start: 03/20/21 2000  Code Status: DNR Family Communication: Updated patient's daughter 7/12 Disposition Plan: Status is: Inpatient  Remains inpatient appropriate because:Ongoing diagnostic testing needed not appropriate for outpatient work up, IV treatments appropriate due to intensity of illness or inability to take PO, and Inpatient level of care appropriate due to severity of illness  Dispo: The patient is from: SNF              Anticipated d/c is to: SNF              Patient currently is not medically stable to d/c.   Difficult to place patient No   Consultants:  General surgery Palliative care   Procedures:  Right chest tube placement on 7/9  Antimicrobials:  Vancomycin 7/9 >7/11 Cefepime 7/9 > 7/12   Subjective: Patient is seen sitting up in bed.  Denies any shortness of breath at this time.  Objective: Vitals:   03/22/21 0700 03/22/21 0722 03/22/21 0800 03/22/21 1100  BP: (!) 143/54  (!) 161/78 (!) 103/48  Pulse: 67 81 86 66  Resp: 18 (!) 22 (!) 21 16  Temp:  98.6 F (37 C)    TempSrc:  Oral    SpO2: 99% 98% 94% 99%  Weight:      Height:        Intake/Output Summary (Last 24 hours) at 03/22/2021 1150 Last data filed at 03/22/2021 0843 Gross per 24 hour  Intake 2365.81 ml  Output 2939 ml  Net -573.19 ml   Filed Weights   04/07/2021 1610 04/06/2021 2331 03/21/21 0400  Weight: 52.2 kg 43.5 kg 45.9 kg    Examination:  General exam: Alert, awake, no distress, cachectic Respiratory system: Clear to auscultation. Respiratory effort normal. Right chest tube Cardiovascular system:RRR. No murmurs, rubs, gallops. Gastrointestinal system: Abdomen is nondistended, soft and nontender. No organomegaly or masses felt. Normal bowel sounds heard. Central nervous system: Alert and oriented. No focal neurological deficits. Extremities: No C/C/E, +pedal pulses Skin: No rashes, lesions or ulcers Psychiatry: Judgement and insight appear normal. Mood & affect  appropriate.    Data Reviewed: I have personally reviewed following labs and imaging studies  CBC: Recent Labs  Lab 03/30/2021 1559 03/20/21 0211 03/21/21 0432 03/22/21 0508  WBC 20.5* 11.5* 12.5* 10.6*  NEUTROABS 12.5*  --   --   --   HGB 10.7* 8.6* 7.8* 8.5*  HCT 34.1* 27.6* 25.6* 27.3*  MCV 100.0 99.3 99.6 98.6  PLT 444* 254 282 322   Basic Metabolic Panel: Recent Labs  Lab 03/26/2021 1559 03/30/2021 2328 03/20/21 0211 03/21/21 0432 03/22/21 0508  NA 139  --  139 140 139  K 3.5  --  4.0 3.9 3.6  CL 99  --  104 106 103  CO2 31  --  28 30 30   GLUCOSE 193*  --  142* 90 95  BUN 30*  --  32* 24* 25*  CREATININE 0.87 0.95 0.81 0.84 0.80  CALCIUM 8.8*  --  8.2* 8.0* 8.5*  PHOS  --   --   --  2.5  --    GFR: Estimated Creatinine Clearance: 51 mL/min (by C-G formula based on SCr of 0.8 mg/dL). Liver Function Tests: Recent Labs  Lab 03/12/2021 1559 03/21/21 0432  AST 21  --   ALT 14  --   ALKPHOS 50  --   BILITOT 0.6  --  PROT 7.8  --   ALBUMIN 3.8 2.6*   No results for input(s): LIPASE, AMYLASE in the last 168 hours. No results for input(s): AMMONIA in the last 168 hours. Coagulation Profile: Recent Labs  Lab 03/17/2021 1559  INR 1.0   Cardiac Enzymes: No results for input(s): CKTOTAL, CKMB, CKMBINDEX, TROPONINI in the last 168 hours. BNP (last 3 results) No results for input(s): PROBNP in the last 8760 hours. HbA1C: No results for input(s): HGBA1C in the last 72 hours. CBG: No results for input(s): GLUCAP in the last 168 hours. Lipid Profile: No results for input(s): CHOL, HDL, LDLCALC, TRIG, CHOLHDL, LDLDIRECT in the last 72 hours. Thyroid Function Tests: No results for input(s): TSH, T4TOTAL, FREET4, T3FREE, THYROIDAB in the last 72 hours. Anemia Panel: Recent Labs    03/21/21 0433  VITAMINB12 264  FOLATE 11.1  FERRITIN 191  TIBC 198*  IRON 52  RETICCTPCT 2.0   Sepsis Labs: Recent Labs  Lab 03/16/2021 1750 03/22/2021 1915 04/10/2021 2328  03/20/21 0211 03/21/21 0432  PROCALCITON 0.26  --   --  5.60 7.35  LATICACIDVEN 2.0* 2.6* 3.4* 2.2* 0.7    Recent Results (from the past 240 hour(s))  Resp Panel by RT-PCR (Flu A&B, Covid) Nasopharyngeal Swab     Status: None   Collection Time: 03/15/2021  3:53 PM   Specimen: Nasopharyngeal Swab; Nasopharyngeal(NP) swabs in vial transport medium  Result Value Ref Range Status   SARS Coronavirus 2 by RT PCR NEGATIVE NEGATIVE Final    Comment: (NOTE) SARS-CoV-2 target nucleic acids are NOT DETECTED.  The SARS-CoV-2 RNA is generally detectable in upper respiratory specimens during the acute phase of infection. The lowest concentration of SARS-CoV-2 viral copies this assay can detect is 138 copies/mL. A negative result does not preclude SARS-Cov-2 infection and should not be used as the sole basis for treatment or other patient management decisions. A negative result may occur with  improper specimen collection/handling, submission of specimen other than nasopharyngeal swab, presence of viral mutation(s) within the areas targeted by this assay, and inadequate number of viral copies(<138 copies/mL). A negative result must be combined with clinical observations, patient history, and epidemiological information. The expected result is Negative.  Fact Sheet for Patients:  EntrepreneurPulse.com.au  Fact Sheet for Healthcare Providers:  IncredibleEmployment.be  This test is no t yet approved or cleared by the [place] FDA and  has been authorized for detection and/or diagnosis of SARS-CoV-2 by FDA under an Emergency Use Authorization (EUA). This EUA will remain  in effect (meaning this test can be used) for the duration of the COVID-19 declaration under Section 564(b)(1) of the Act, 21 U.S.C.section 360bbb-3(b)(1), unless the authorization is terminated  or revoked sooner.       Influenza A by PCR NEGATIVE NEGATIVE Final   Influenza B by PCR  NEGATIVE NEGATIVE Final    Comment: (NOTE) The Xpert Xpress SARS-CoV-2/FLU/RSV plus assay is intended as an aid in the diagnosis of influenza from Nasopharyngeal swab specimens and should not be used as a sole basis for treatment. Nasal washings and aspirates are unacceptable for Xpert Xpress SARS-CoV-2/FLU/RSV testing.  Fact Sheet for Patients: EntrepreneurPulse.com.au  Fact Sheet for Healthcare Providers: IncredibleEmployment.be  This test is not yet approved or cleared by the [place] FDA and has been authorized for detection and/or diagnosis of SARS-CoV-2 by FDA under an Emergency Use Authorization (EUA). This EUA will remain in effect (meaning this test can be used) for the duration of the COVID-19 declaration under Section  564(b)(1) of the Act, 21 U.S.C. section 360bbb-3(b)(1), unless the authorization is terminated or revoked.  Performed at [facility], [address], [place], [place] [address]   Blood Culture (routine x 2)     Status: None (Preliminary result)   Collection Time: 03/18/2021  3:59 PM   Specimen: BLOOD RIGHT ARM  Result Value Ref Range Status   Specimen Description   Final    BLOOD RIGHT ARM BOTTLES DRAWN AEROBIC AND ANAEROBIC   Special Requests Blood Culture adequate volume  Final   Culture   Final    NO GROWTH 3 DAYS Performed at [facility], [address] [place], [place] [address]    Report Status PENDING  Incomplete  Blood Culture (routine x 2)     Status: None (Preliminary result)   Collection Time: 03/18/2021  4:15 PM   Specimen: Right Antecubital; Blood  Result Value Ref Range Status   Specimen Description   Final    RIGHT ANTECUBITAL BOTTLES DRAWN AEROBIC AND ANAEROBIC   Special Requests Blood Culture adequate volume  Final   Culture   Final    NO GROWTH 3 DAYS Performed at [facility] [address] [place], [place] [address]    Report Status PENDING  Incomplete  MRSA Next Gen by PCR, Nasal      Status: None   Collection Time: 03/20/2021  4:29 PM   Specimen: Nasal Mucosa; Nasal Swab  Result Value Ref Range Status   MRSA by PCR Next Gen NOT DETECTED NOT DETECTED Final    Comment: (NOTE) The GeneXpert MRSA Assay (FDA approved for NASAL specimens only), is one component of a comprehensive MRSA colonization surveillance program. It is not intended to diagnose MRSA infection nor to guide or monitor treatment for MRSA infections. Test performance is not FDA approved in patients less than 48 years old. Performed at [facility], [address], [place], [place] [address]   Urine culture     Status: None   Collection Time: 04/03/2021 11:45 PM   Specimen: In/Out Cath Urine  Result Value Ref Range Status   Specimen Description   Final    IN/OUT CATH URINE Performed at [facility], [address], [place], [place] [address]    Special Requests   Final    NONE Performed at [facility], [address] [place],  [address]    Culture   Final    NO GROWTH Performed at [facility], [place] [address] [place],  [address]    Report Status 03/21/2021 FINAL  Final         Radiology Studies: DG Chest Port 1 View  Result Date: 03/22/2021 CLINICAL DATA:  Right chest tube.  Follow-up pneumothorax. EXAM: PORTABLE CHEST 1 VIEW COMPARISON:  03/20/2021 FINDINGS: Right pigtail thoracostomy tube remains in place. Tiny amount of pleural air remains along the lateral margin. Subcutaneous air remains visible. Heart size is normal. Mediastinal shadows are otherwise normal. There is mild atelectasis at the left lung base. Emphysema and pulmonary scarring as seen previously. IMPRESSION: Right chest tube remains in place. Small amount of pleural air in the lateral pleural space. Subcutaneous emphysema. Mild left base atelectasis. Underlying emphysema pulmonary scarring. Electronically Signed   By: Nelson Chimes M.D.   On: 03/22/2021 07:57        Scheduled Meds:  chlorhexidine  15 mL  Mouth Rinse BID   Chlorhexidine Gluconate Cloth  6 each Topical Daily   enoxaparin (LOVENOX) injection  30 mg Subcutaneous Q24H  feeding supplement  237 mL Oral BID BM   mouth rinse  15 mL Mouth Rinse q12n4p   mirtazapine  7.5 mg Oral QHS   mometasone-formoterol  2 puff Inhalation BID   tamsulosin  0.4 mg Oral Daily   umeclidinium bromide  1 puff Inhalation Daily   Continuous Infusions:  ceFEPime (MAXIPIME) IV Stopped (03/22/21 0409)   lactated ringers 75 mL/hr at 03/22/21 1047     LOS: 3 days    Time spent: 61mins    Kathie Dike, MD [facility]   If 7PM-7AM, please contact night-coverage [email or web address]  03/22/2021, 11:50 AM

## 2021-03-23 ENCOUNTER — Inpatient Hospital Stay (HOSPITAL_COMMUNITY): Payer: No Typology Code available for payment source

## 2021-03-23 DIAGNOSIS — J449 Chronic obstructive pulmonary disease, unspecified: Secondary | ICD-10-CM | POA: Diagnosis not present

## 2021-03-23 DIAGNOSIS — J9611 Chronic respiratory failure with hypoxia: Secondary | ICD-10-CM | POA: Diagnosis not present

## 2021-03-23 DIAGNOSIS — R4189 Other symptoms and signs involving cognitive functions and awareness: Secondary | ICD-10-CM | POA: Diagnosis not present

## 2021-03-23 DIAGNOSIS — J939 Pneumothorax, unspecified: Secondary | ICD-10-CM | POA: Diagnosis not present

## 2021-03-23 LAB — CBC
HCT: 25.5 % — ABNORMAL LOW (ref 39.0–52.0)
Hemoglobin: 8.2 g/dL — ABNORMAL LOW (ref 13.0–17.0)
MCH: 30.8 pg (ref 26.0–34.0)
MCHC: 32.2 g/dL (ref 30.0–36.0)
MCV: 95.9 fL (ref 80.0–100.0)
Platelets: 301 10*3/uL (ref 150–400)
RBC: 2.66 MIL/uL — ABNORMAL LOW (ref 4.22–5.81)
RDW: 15.1 % (ref 11.5–15.5)
WBC: 10.2 10*3/uL (ref 4.0–10.5)
nRBC: 0 % (ref 0.0–0.2)

## 2021-03-23 LAB — RENAL FUNCTION PANEL
Albumin: 2.4 g/dL — ABNORMAL LOW (ref 3.5–5.0)
Anion gap: 3 — ABNORMAL LOW (ref 5–15)
BUN: 15 mg/dL (ref 8–23)
CO2: 33 mmol/L — ABNORMAL HIGH (ref 22–32)
Calcium: 8.1 mg/dL — ABNORMAL LOW (ref 8.9–10.3)
Chloride: 102 mmol/L (ref 98–111)
Creatinine, Ser: 0.92 mg/dL (ref 0.61–1.24)
GFR, Estimated: >60 mL/min (ref >60)
Glucose, Bld: 111 mg/dL — ABNORMAL HIGH (ref 70–99)
Phosphorus: 2.3 mg/dL — ABNORMAL LOW (ref 2.5–4.6)
Potassium: 3.4 mmol/L — ABNORMAL LOW (ref 3.5–5.1)
Sodium: 138 mmol/L (ref 135–145)

## 2021-03-23 MED ORDER — IPRATROPIUM-ALBUTEROL 0.5-2.5 (3) MG/3ML IN SOLN
3.0000 mL | Freq: Four times a day (QID) | RESPIRATORY_TRACT | Status: DC
  Administered 2021-03-23: 3 mL via RESPIRATORY_TRACT
  Filled 2021-03-23: qty 3

## 2021-03-23 MED ORDER — POTASSIUM CHLORIDE CRYS ER 20 MEQ PO TBCR
40.0000 meq | EXTENDED_RELEASE_TABLET | Freq: Once | ORAL | Status: AC
  Administered 2021-03-23: 40 meq via ORAL
  Filled 2021-03-23: qty 2

## 2021-03-23 MED ORDER — IPRATROPIUM-ALBUTEROL 0.5-2.5 (3) MG/3ML IN SOLN
3.0000 mL | Freq: Four times a day (QID) | RESPIRATORY_TRACT | Status: DC
  Administered 2021-03-23 – 2021-03-24 (×2): 3 mL via RESPIRATORY_TRACT
  Filled 2021-03-23 (×2): qty 3

## 2021-03-23 MED ORDER — IPRATROPIUM-ALBUTEROL 0.5-2.5 (3) MG/3ML IN SOLN
3.0000 mL | Freq: Four times a day (QID) | RESPIRATORY_TRACT | Status: DC | PRN
  Administered 2021-03-24 – 2021-03-31 (×5): 3 mL via RESPIRATORY_TRACT
  Filled 2021-03-23 (×5): qty 3

## 2021-03-23 NOTE — Progress Notes (Signed)
     [address]       [place] [address]             [phone]       Full consult to follow. 75 year old male admitted with a spontaneous right pneumothorax.  This is been treated with tube thoracoscopy.  He has a history of severe COPD and is on supplemental oxygen at baseline.  Cross-sectional imaging from several years ago when he had a left-sided spontaneous pneumothorax revealed severe emphysema along with a dominant right bulla.  We currently have no new cross-sectional imaging.  Discussed potential treatment plans with the family, and they feel that he may be too frail to undergo surgical therapy.  Will await the CT scan for final recommendation but for now we will continue with chest tube drainage.  Other options include chemical pleurodesis as well as long-term chest tube drainage.  Graham Doukas Bary Leriche

## 2021-03-23 NOTE — Progress Notes (Signed)
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  He was found to have large right-sided pneumothorax and had chest tube placed in the emergency room.  Pneumothorax resolved after chest tube placement, but has had a persistent air leak since then.  Palliative care following to help address goals of care.  General surgery is also been following for chest tube management.  At this point, does not appear to be reasonable to remove chest tube due to persistent air leak.  Patient is being transferred to [facility] for cardiothoracic surgery/pulmonology consultation to see if there are any less invasive options to help manage pneumothorax.   7/13- pt tells me this am he "feels better" . Reports if touch his chest it will illicit pain. Denies sob  Assessment & Plan:   Active Problems:   Lung blebs (HCC)   Persistent cognitive impairment   Pneumothorax, right   COPD (chronic obstructive pulmonary disease) (HCC)   Chronic respiratory failure with hypoxia (HCC)   Right-sided pneumothorax -History of pulm blebs in the past status post VATS/minithoracotomy and resection of apical and lingular blebs with talc pleurodesis and closure of bronchopleural fistula in 2016 -Patient had chest tube placed in the emergency room on 7/9 -Follow-up chest x-rays have shown resolution of pneumothorax -General surgery consulted to assist with management of chest tube -patient continues to have air leak and it does not appear to be reasonable to remove chest tube at this point -Family is requesting further input from cardiothoracic surgery regarding any further less invasive options to help manage pneumothorax 7/13-Per surgery yesterday  patient with ongoing air leak noted.  Awaiting decision from family as to plan of care. Ultimately if there are no feasible options to manage pneumothorax, they may transition to comfort care, but it will be helpful to have this input prior to making decisions Palliative following  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 No wheezing or shortness of breath this AM Chronically on 4 L O2  Hypokalemia-mild K3.4 today We will replace Monitor periodically   Protein calorie malnutrition BMI 15 Nutrition consulted Drinking protein shake.  Encouraged him to continue finishing his drinks.   SIRS -Patient was tachypneic and tachycardic on admission -He was noted to have elevated lactic acid -Lactic acid has been trending down with IV fluids -Currently on broad-spectrum antibiotics -No evidence of pneumonia on chest x-ray -Blood/urine cultures have shown no growth and he has been afebrile -MRSA PCR negative -discontinue vancomycin and cefepime    Dementia -Continue supportive care  Anemia -Daughter had previously reported that he was previously on iron supplements -No signs of bleeding at present -Suspected degree of iron deficiency -Hemoglobin currently stable -continue to follow  Goals of care -Patient is a long-term resident of a skilled nursing facility and is wheelchair dependent -Patient has advanced COPD and has poor functional reserve -Clinically, he has improved with chest tube placement, he is able to interact with his family, he is eating and drinking and appears to be breathing comfortably -With persistent air leak, removal of chest tube at this point does not seem to be feasible -Palliative care is following to help further address goals of care -Family has some difficult decisions to make regarding further management of chest  tube -They would like to know all available options including removal of chest tube with transition to comfort care versus leaving  chest tube and and placing a flutter valve, versus other less invasive procedures -After discussing with general surgery, patient's family and palliative care, it was felt that best option would be to transfer patient to [facility] to be seen by cardiothoracic surgery so that family can make an informed decision regarding further goals   DVT prophylaxis: enoxaparin (LOVENOX) injection 30 mg Start: 03/20/21 2000  Code Status: DNR Family Communication: Updated patient's daughter 7/13. Disposition Plan: Status is: Inpatient  Remains inpatient appropriate because:Ongoing diagnostic testing needed not appropriate for outpatient work up, IV treatments appropriate due to intensity of illness or inability to take PO, and Inpatient level of care appropriate due to severity of illness  Dispo: The patient is from: SNF              Anticipated d/c is to: SNF              Patient currently is not medically stable to d/c.   Difficult to place patient No   Consultants:  General surgery Palliative care CTA Dr. Kipp Brood notifed today 7/13  Procedures:  Right chest tube placement on 7/9  Antimicrobials:  Vancomycin 7/9 >7/11 Cefepime 7/9 > 7/12   Subjective: Pt denies sob, cp.   Objective: Vitals:   03/22/21 1500 03/22/21 1909 03/22/21 2300 03/23/21 0307  BP: (!) 145/75 136/63 (!) 112/58 128/64  Pulse: 100 86 78 66  Resp: 20 19 16 15   Temp: 98.5 F (36.9 C) 98.2 F (36.8 C) 97.6 F (36.4 C) (!) 97.5 F (36.4 C)  TempSrc: Oral Oral Axillary Axillary  SpO2: 96% 95% 100% 99%  Weight:      Height:        Intake/Output Summary (Last 24 hours) at 03/23/2021 0755 Last data filed at 03/23/2021 0700 Gross per 24 hour  Intake 2269.21 ml  Output 3325 ml  Net -1055.79 ml   Filed Weights   03/27/2021 1610 04/02/2021 2331 03/21/21 0400  Weight: 52.2 kg 43.5 kg 45.9 kg    Examination:  Frail, nad Decrease bs, no wheezing Regular s1/s2 no gallop Right CT in place Soft benign, +bs No  edema Awakens, appears tired.  Mood and affect appropriate for current setting   Data Reviewed: I have personally reviewed following labs and imaging studies  CBC: Recent Labs  Lab 03/30/2021 1559 03/20/21 0211 03/21/21 0432 03/22/21 0508 03/23/21 0057  WBC 20.5* 11.5* 12.5* 10.6* 10.2  NEUTROABS 12.5*  --   --   --   --   HGB 10.7* 8.6* 7.8* 8.5* 8.2*  HCT 34.1* 27.6* 25.6* 27.3* 25.5*  MCV 100.0 99.3 99.6 98.6 95.9  PLT 444* 254 282 286 097   Basic Metabolic Panel: Recent Labs  Lab 04/04/2021 1559 03/26/2021 2328 03/20/21 0211 03/21/21 0432 03/22/21 0508 03/23/21 0057  NA 139  --  139 140 139 138  K 3.5  --  4.0 3.9 3.6 3.4*  CL 99  --  104 106 103 102  CO2 31  --  28 30 30  33*  GLUCOSE 193*  --  142* 90 95 111*  BUN 30*  --  32* 24* 25* 15  CREATININE 0.87 0.95 0.81 0.84 0.80 0.92  CALCIUM 8.8*  --  8.2* 8.0* 8.5* 8.1*  PHOS  --   --   --  2.5  --  2.3*   GFR: Estimated Creatinine Clearance:  44.3 mL/min (by C-G formula based on SCr of 0.92 mg/dL). Liver Function Tests: Recent Labs  Lab 03/30/2021 1559 03/21/21 0432 03/23/21 0057  AST 21  --   --   ALT 14  --   --   ALKPHOS 50  --   --   BILITOT 0.6  --   --   PROT 7.8  --   --   ALBUMIN 3.8 2.6* 2.4*   No results for input(s): LIPASE, AMYLASE in the last 168 hours. No results for input(s): AMMONIA in the last 168 hours. Coagulation Profile: Recent Labs  Lab 03/23/2021 1559  INR 1.0   Cardiac Enzymes: No results for input(s): CKTOTAL, CKMB, CKMBINDEX, TROPONINI in the last 168 hours. BNP (last 3 results) No results for input(s): PROBNP in the last 8760 hours. HbA1C: No results for input(s): HGBA1C in the last 72 hours. CBG: No results for input(s): GLUCAP in the last 168 hours. Lipid Profile: No results for input(s): CHOL, HDL, LDLCALC, TRIG, CHOLHDL, LDLDIRECT in the last 72 hours. Thyroid Function Tests: No results for input(s): TSH, T4TOTAL, FREET4, T3FREE, THYROIDAB in the last 72 hours. Anemia  Panel: Recent Labs    03/21/21 0433  VITAMINB12 264  FOLATE 11.1  FERRITIN 191  TIBC 198*  IRON 52  RETICCTPCT 2.0   Sepsis Labs: Recent Labs  Lab 04/10/2021 1750 04/10/2021 1915 04/01/2021 2328 03/20/21 0211 03/21/21 0432  PROCALCITON 0.26  --   --  5.60 7.35  LATICACIDVEN 2.0* 2.6* 3.4* 2.2* 0.7    Recent Results (from the past 240 hour(s))  Resp Panel by RT-PCR (Flu A&B, Covid) Nasopharyngeal Swab     Status: None   Collection Time: 03/16/2021  3:53 PM   Specimen: Nasopharyngeal Swab; Nasopharyngeal(NP) swabs in vial transport medium  Result Value Ref Range Status   SARS Coronavirus 2 by RT PCR NEGATIVE NEGATIVE Final    Comment: (NOTE) SARS-CoV-2 target nucleic acids are NOT DETECTED.  The SARS-CoV-2 RNA is generally detectable in upper respiratory specimens during the acute phase of infection. The lowest concentration of SARS-CoV-2 viral copies this assay can detect is 138 copies/mL. A negative result does not preclude SARS-Cov-2 infection and should not be used as the sole basis for treatment or other patient management decisions. A negative result may occur with  improper specimen collection/handling, submission of specimen other than nasopharyngeal swab, presence of viral mutation(s) within the areas targeted by this assay, and inadequate number of viral copies(<138 copies/mL). A negative result must be combined with clinical observations, patient history, and epidemiological information. The expected result is Negative.  Fact Sheet for Patients:  EntrepreneurPulse.com.au  Fact Sheet for Healthcare Providers:  IncredibleEmployment.be  This test is no t yet approved or cleared by the [place] FDA and  has been authorized for detection and/or diagnosis of SARS-CoV-2 by FDA under an Emergency Use Authorization (EUA). This EUA will remain  in effect (meaning this test can be used) for the duration of the COVID-19 declaration  under Section 564(b)(1) of the Act, 21 U.S.C.section 360bbb-3(b)(1), unless the authorization is terminated  or revoked sooner.       Influenza A by PCR NEGATIVE NEGATIVE Final   Influenza B by PCR NEGATIVE NEGATIVE Final    Comment: (NOTE) The Xpert Xpress SARS-CoV-2/FLU/RSV plus assay is intended as an aid in the diagnosis of influenza from Nasopharyngeal swab specimens and should not be used as a sole basis for treatment. Nasal washings and aspirates are unacceptable for Xpert Xpress SARS-CoV-2/FLU/RSV testing.  Fact Sheet for Patients: EntrepreneurPulse.com.au  Fact Sheet for Healthcare Providers: IncredibleEmployment.be  This test is not yet approved or cleared by the [place] FDA and has been authorized for detection and/or diagnosis of SARS-CoV-2 by FDA under an Emergency Use Authorization (EUA). This EUA will remain in effect (meaning this test can be used) for the duration of the COVID-19 declaration under Section 564(b)(1) of the Act, 21 U.S.C. section 360bbb-3(b)(1), unless the authorization is terminated or revoked.  Performed at [facility], [address], [place], [place] [address]   Blood Culture (routine x 2)     Status: None (Preliminary result)   Collection Time: 03/27/2021  3:59 PM   Specimen: BLOOD RIGHT ARM  Result Value Ref Range Status   Specimen Description   Final    BLOOD RIGHT ARM BOTTLES DRAWN AEROBIC AND ANAEROBIC   Special Requests Blood Culture adequate volume  Final   Culture   Final    NO GROWTH 4 DAYS Performed at [facility], [address], [place], [place] [address]    Report Status PENDING  Incomplete  Blood Culture (routine x 2)     Status: None (Preliminary result)   Collection Time: 03/24/2021  4:15 PM   Specimen: Right Antecubital; Blood  Result Value Ref Range Status   Specimen Description   Final    RIGHT ANTECUBITAL BOTTLES DRAWN AEROBIC AND ANAEROBIC   Special Requests Blood Culture  adequate volume  Final   Culture   Final    NO GROWTH 4 DAYS Performed at [facility], [address] [place], [place] [address]    Report Status PENDING  Incomplete  MRSA Next Gen by PCR, Nasal     Status: None   Collection Time: 04/10/2021  4:29 PM   Specimen: Nasal Mucosa; Nasal Swab  Result Value Ref Range Status   MRSA by PCR Next Gen NOT DETECTED NOT DETECTED Final    Comment: (NOTE) The GeneXpert MRSA Assay (FDA approved for NASAL specimens only), is one component of a comprehensive MRSA colonization surveillance program. It is not intended to diagnose MRSA infection nor to guide or monitor treatment for MRSA infections. Test performance is not FDA approved in patients less than 26 years old. Performed at [facility], [address], [place], [place] [address]   Urine culture     Status: None   Collection Time: 03/29/2021 11:45 PM   Specimen: In/Out Cath Urine  Result Value Ref Range Status   Specimen Description   Final    IN/OUT CATH URINE Performed at [facility], [address], [place], [place] [address]    Special Requests   Final    NONE Performed at [facility], [address] [place], [place] [address]    Culture   Final    NO GROWTH Performed at [facility], [place] [address], [place],  [address]    Report Status 03/21/2021 FINAL  Final         Radiology Studies: DG Chest Port 1 View  Result Date: 03/22/2021 CLINICAL DATA:  Right chest tube.  Follow-up pneumothorax. EXAM: PORTABLE CHEST 1 VIEW COMPARISON:  03/20/2021 FINDINGS: Right pigtail thoracostomy tube remains in place. Tiny amount of pleural air remains along the lateral margin. Subcutaneous air remains visible. Heart size is normal. Mediastinal shadows are otherwise normal. There is mild atelectasis at the left lung base. Emphysema and pulmonary scarring as seen previously. IMPRESSION: Right chest tube remains in place. Small amount of pleural air in the  lateral pleural space.  Subcutaneous emphysema. Mild left base atelectasis. Underlying emphysema pulmonary scarring. Electronically Signed   By: Nelson Chimes M.D.   On: 03/22/2021 07:57        Scheduled Meds:  chlorhexidine  15 mL Mouth Rinse BID   Chlorhexidine Gluconate Cloth  6 each Topical Daily   enoxaparin (LOVENOX) injection  30 mg Subcutaneous Q24H   feeding supplement  237 mL Oral BID BM   mouth rinse  15 mL Mouth Rinse q12n4p   mirtazapine  7.5 mg Oral QHS   mometasone-formoterol  2 puff Inhalation BID   tamsulosin  0.4 mg Oral Daily   umeclidinium bromide  1 puff Inhalation Daily   Continuous Infusions:  lactated ringers 75 mL/hr at 03/23/21 0329     LOS: 4 days    Time spent: 43mins with >50% on coc    Nolberto Hanlon, MD [facility]   If 7PM-7AM, please contact night-coverage [email or web address]  03/23/2021, 7:55 AM

## 2021-03-24 DIAGNOSIS — J449 Chronic obstructive pulmonary disease, unspecified: Secondary | ICD-10-CM | POA: Diagnosis not present

## 2021-03-24 DIAGNOSIS — J9611 Chronic respiratory failure with hypoxia: Secondary | ICD-10-CM | POA: Diagnosis not present

## 2021-03-24 DIAGNOSIS — J939 Pneumothorax, unspecified: Secondary | ICD-10-CM | POA: Diagnosis not present

## 2021-03-24 DIAGNOSIS — R4189 Other symptoms and signs involving cognitive functions and awareness: Secondary | ICD-10-CM | POA: Diagnosis not present

## 2021-03-24 LAB — CULTURE, BLOOD (ROUTINE X 2)
Culture: NO GROWTH
Culture: NO GROWTH
Special Requests: ADEQUATE
Special Requests: ADEQUATE

## 2021-03-24 MED ORDER — ENSURE ENLIVE PO LIQD
237.0000 mL | Freq: Four times a day (QID) | ORAL | Status: DC
  Administered 2021-03-24 – 2021-04-03 (×39): 237 mL via ORAL

## 2021-03-24 NOTE — Progress Notes (Signed)
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  He was found to have large right-sided pneumothorax and had chest tube placed in the emergency room.  Pneumothorax resolved after chest tube placement, but has had a persistent air leak since then.  Palliative care following to help address goals of care.  General surgery is also been following for chest tube management.  At this point, does not appear to be reasonable to remove chest tube due to persistent air leak.  Patient is being transferred to [facility] for cardiothoracic surgery/pulmonology consultation to see if there are any less invasive options to help manage pneumothorax.     Assessment & Plan:   Active Problems:   Lung blebs (HCC)   Persistent cognitive impairment   Pneumothorax, right   COPD (chronic obstructive pulmonary disease) (HCC)   Chronic respiratory failure with hypoxia (HCC)   Right-sided pneumothorax -History of pulm blebs in the past status post VATS/minithoracotomy and resection of apical and lingular blebs with talc pleurodesis and closure of bronchopleural fistula in 2016 -Patient had chest tube placed in the emergency room on 7/9 -Follow-up chest x-rays have shown resolution of pneumothorax -General surgery consulted to assist with management of chest tube -patient continues to have air leak and it does not appear to be reasonable to remove chest tube at this point -Family is requesting further input from cardiothoracic surgery regarding any further less invasive options to help manage pneumothorax Per surgery yesterday patient with ongoing air leak noted.  Awaiting decision from family as to plan of care. Ultimately if there  are no feasible options to manage pneumothorax, they may transition to comfort care, but it will be helpful to have this input prior to making decisions Palliative following 7/14-CTS saw the patient.  Dr. Kipp Brood recommended CT scan of the chest.  For now continue chest tube drainage. CT from 7/13 completed plz see full report.  Found with mod sized rt Ptx.severe emphysema and right apical subpleural scarring. Will follow-up CTS for further recommendations  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 Denies shortness of breath this AM Chronically on 4 L O2    Hypokalemia-mild Was replaced  monitor levels periodically   Protein calorie malnutrition BMI 15 Drinking protein shake.  7/14-RD consulted, would like to liberalize diet.  SIRS -Patient was tachypneic and tachycardic on admission -He was noted to have elevated lactic acid -Lactic acid has been trending down with IV fluids -Currently on broad-spectrum antibiotics -No evidence of pneumonia on chest x-ray -Blood/urine cultures have shown no growth and he has been afebrile -MRSA PCR negative -discontinued vancomycin and cefepime    Dementia -Continue supportive care  Anemia -Daughter had previously reported that he was previously on iron supplements -No signs of bleeding at present -Suspected degree of iron deficiency -Hemoglobin currently stable -continue to follow  Goals of care -Patient is a long-term resident of a skilled nursing facility and is wheelchair dependent -Patient has advanced COPD and has poor functional reserve -Clinically, he has improved with chest tube placement, he is able to interact with his family, he is eating and drinking and appears to be breathing comfortably -With persistent air leak, removal of chest tube at this point does not seem to  be feasible -Palliative care is following to help further address goals of care -Family has some difficult decisions to make regarding further  management of chest tube -They would like to know all available options including removal of chest tube with transition to comfort care versus leaving chest tube and and placing a flutter valve, versus other less invasive procedures -After discussing with general surgery, patient's family and palliative care, it was felt that best option would be to transfer patient to [facility] to be seen by cardiothoracic surgery so that family can make an informed decision regarding further goals CTS consulted as above   DVT prophylaxis: enoxaparin (LOVENOX) injection 30 mg Start: 03/20/21 2000  Code Status: DNR Family Communication: None at bedside Disposition Plan: Status is: Inpatient  Remains inpatient appropriate because:Ongoing diagnostic testing needed not appropriate for outpatient work up, IV treatments appropriate due to intensity of illness or inability to take PO, and Inpatient level of care appropriate due to severity of illness  Dispo: The patient is from: SNF              Anticipated d/c is to: SNF              Patient currently is not medically stable to d/c.   Difficult to place patient No   Consultants:  General surgery Palliative care CTA Dr. Kipp Brood notifed today 7/13  Procedures:  Right chest tube placement on 7/9  Antimicrobials:  Vancomycin 7/9 >7/11 Cefepime 7/9 > 7/12   Subjective: Patient denies shortness of breath, chest pain, abdominal pain.  Eating candy and chocolate.  Chest tube in place  Objective: Vitals:   03/24/21 0724 03/24/21 0807 03/24/21 0808 03/24/21 0811  BP: (!) 129/54     Pulse: 82     Resp: (!) 22     Temp: 97.8 F (36.6 C)     TempSrc: Oral     SpO2: 100% 99% 98% 96%  Weight:      Height:        Intake/Output Summary (Last 24 hours) at 03/24/2021 0859 Last data filed at 03/24/2021 0602 Gross per 24 hour  Intake 1869.95 ml  Output 1500 ml  Net 369.95 ml   Filed Weights   03/23/2021 1610 03/13/2021 2331 03/21/21 0400  Weight: 52.2  kg 43.5 kg 45.9 kg    Examination: Frail, pleasant, NAD Decreased breath sounds bilaterally, no wheezing Regular S1-S2 no gallops Soft benign positive bowel sounds No edema Mood and affect appropriate current setting   Data Reviewed: I have personally reviewed following labs and imaging studies  CBC: Recent Labs  Lab 03/23/2021 1559 03/20/21 0211 03/21/21 0432 03/22/21 0508 03/23/21 0057  WBC 20.5* 11.5* 12.5* 10.6* 10.2  NEUTROABS 12.5*  --   --   --   --   HGB 10.7* 8.6* 7.8* 8.5* 8.2*  HCT 34.1* 27.6* 25.6* 27.3* 25.5*  MCV 100.0 99.3 99.6 98.6 95.9  PLT 444* 254 282 286 916   Basic Metabolic Panel: Recent Labs  Lab 03/17/2021 1559 03/27/2021 2328 03/20/21 0211 03/21/21 0432 03/22/21 0508 03/23/21 0057  NA 139  --  139 140 139 138  K 3.5  --  4.0 3.9 3.6 3.4*  CL 99  --  104 106 103 102  CO2 31  --  28 30 30  33*  GLUCOSE 193*  --  142* 90 95 111*  BUN 30*  --  32* 24* 25* 15  CREATININE 0.87 0.95 0.81 0.84 0.80 0.92  CALCIUM 8.8*  --  8.2* 8.0* 8.5* 8.1*  PHOS  --   --   --  2.5  --  2.3*   GFR: Estimated Creatinine Clearance: 44.3 mL/min (by C-G formula based on SCr of 0.92 mg/dL). Liver Function Tests: Recent Labs  Lab 03/11/2021 1559 03/21/21 0432 03/23/21 0057  AST 21  --   --   ALT 14  --   --   ALKPHOS 50  --   --   BILITOT 0.6  --   --   PROT 7.8  --   --   ALBUMIN 3.8 2.6* 2.4*   No results for input(s): LIPASE, AMYLASE in the last 168 hours. No results for input(s): AMMONIA in the last 168 hours. Coagulation Profile: Recent Labs  Lab 03/14/2021 1559  INR 1.0   Cardiac Enzymes: No results for input(s): CKTOTAL, CKMB, CKMBINDEX, TROPONINI in the last 168 hours. BNP (last 3 results) No results for input(s): PROBNP in the last 8760 hours. HbA1C: No results for input(s): HGBA1C in the last 72 hours. CBG: No results for input(s): GLUCAP in the last 168 hours. Lipid Profile: No results for input(s): CHOL, HDL, LDLCALC, TRIG, CHOLHDL,  LDLDIRECT in the last 72 hours. Thyroid Function Tests: No results for input(s): TSH, T4TOTAL, FREET4, T3FREE, THYROIDAB in the last 72 hours. Anemia Panel: No results for input(s): VITAMINB12, FOLATE, FERRITIN, TIBC, IRON, RETICCTPCT in the last 72 hours.  Sepsis Labs: Recent Labs  Lab 03/25/2021 1750 03/18/2021 1915 03/31/2021 2328 03/20/21 0211 03/21/21 0432  PROCALCITON 0.26  --   --  5.60 7.35  LATICACIDVEN 2.0* 2.6* 3.4* 2.2* 0.7    Recent Results (from the past 240 hour(s))  Resp Panel by RT-PCR (Flu A&B, Covid) Nasopharyngeal Swab     Status: None   Collection Time: 04/01/2021  3:53 PM   Specimen: Nasopharyngeal Swab; Nasopharyngeal(NP) swabs in vial transport medium  Result Value Ref Range Status   SARS Coronavirus 2 by RT PCR NEGATIVE NEGATIVE Final    Comment: (NOTE) SARS-CoV-2 target nucleic acids are NOT DETECTED.  The SARS-CoV-2 RNA is generally detectable in upper respiratory specimens during the acute phase of infection. The lowest concentration of SARS-CoV-2 viral copies this assay can detect is 138 copies/mL. A negative result does not preclude SARS-Cov-2 infection and should not be used as the sole basis for treatment or other patient management decisions. A negative result may occur with  improper specimen collection/handling, submission of specimen other than nasopharyngeal swab, presence of viral mutation(s) within the areas targeted by this assay, and inadequate number of viral copies(<138 copies/mL). A negative result must be combined with clinical observations, patient history, and epidemiological information. The expected result is Negative.  Fact Sheet for Patients:  EntrepreneurPulse.com.au  Fact Sheet for Healthcare Providers:  IncredibleEmployment.be  This test is no t yet approved or cleared by the [place] FDA and  has been authorized for detection and/or diagnosis of SARS-CoV-2 by FDA under an Emergency  Use Authorization (EUA). This EUA will remain  in effect (meaning this test can be used) for the duration of the COVID-19 declaration under Section 564(b)(1) of the Act, 21 U.S.C.section 360bbb-3(b)(1), unless the authorization is terminated  or revoked sooner.       Influenza A by PCR NEGATIVE NEGATIVE Final   Influenza B by PCR NEGATIVE NEGATIVE Final    Comment: (NOTE) The Xpert Xpress SARS-CoV-2/FLU/RSV plus assay is intended as an aid in the diagnosis of influenza from Nasopharyngeal swab specimens and should not be used as a sole  basis for treatment. Nasal washings and aspirates are unacceptable for Xpert Xpress SARS-CoV-2/FLU/RSV testing.  Fact Sheet for Patients: EntrepreneurPulse.com.au  Fact Sheet for Healthcare Providers: IncredibleEmployment.be  This test is not yet approved or cleared by the [place] FDA and has been authorized for detection and/or diagnosis of SARS-CoV-2 by FDA under an Emergency Use Authorization (EUA). This EUA will remain in effect (meaning this test can be used) for the duration of the COVID-19 declaration under Section 564(b)(1) of the Act, 21 U.S.C. section 360bbb-3(b)(1), unless the authorization is terminated or revoked.  Performed at [facility], [address], [place], [place] [address]   Blood Culture (routine x 2)     Status: None   Collection Time: 03/23/2021  3:59 PM   Specimen: BLOOD RIGHT ARM  Result Value Ref Range Status   Specimen Description   Final    BLOOD RIGHT ARM BOTTLES DRAWN AEROBIC AND ANAEROBIC   Special Requests Blood Culture adequate volume  Final   Culture   Final    NO GROWTH 5 DAYS Performed at [facility], [address] [place], [place] [address]    Report Status 03/24/2021 FINAL  Final  Blood Culture (routine x 2)     Status: None   Collection Time: 03/24/2021  4:15 PM   Specimen: Right Antecubital; Blood  Result Value Ref Range Status   Specimen Description    Final    RIGHT ANTECUBITAL BOTTLES DRAWN AEROBIC AND ANAEROBIC   Special Requests Blood Culture adequate volume  Final   Culture   Final    NO GROWTH 5 DAYS Performed at [facility], [address] [place], [place] [address]    Report Status 03/24/2021 FINAL  Final  MRSA Next Gen by PCR, Nasal     Status: None   Collection Time: 03/23/2021  4:29 PM   Specimen: Nasal Mucosa; Nasal Swab  Result Value Ref Range Status   MRSA by PCR Next Gen NOT DETECTED NOT DETECTED Final    Comment: (NOTE) The GeneXpert MRSA Assay (FDA approved for NASAL specimens only), is one component of a comprehensive MRSA colonization surveillance program. It is not intended to diagnose MRSA infection nor to guide or monitor treatment for MRSA infections. Test performance is not FDA approved in patients less than 11 years old. Performed at [facility], [address] [place], [place] [address]   Urine culture     Status: None   Collection Time: 04/02/2021 11:45 PM   Specimen: In/Out Cath Urine  Result Value Ref Range Status   Specimen Description   Final    IN/OUT CATH URINE Performed at [facility], [address], [place], [place] [address]    Special Requests   Final    NONE Performed at [facility], [address], [place], [place] [address]    Culture   Final    NO GROWTH Performed at [facility], [place] [address], [place],  [address]    Report Status 03/21/2021 FINAL  Final         Radiology Studies: CT CHEST WO CONTRAST  Result Date: 03/23/2021 CLINICAL DATA:  75 year old male with COPD and pneumothorax. EXAM: CT CHEST WITHOUT CONTRAST TECHNIQUE: Multidetector CT imaging of the chest was performed following the standard protocol without IV contrast. COMPARISON:  Chest CT dated 10/28/2014 and radiograph dated 03/22/2021. FINDINGS: Evaluation of this exam is limited in the absence of intravenous contrast. Cardiovascular: There is no cardiomegaly. Small pericardial effusion. Coronary  vascular calcification. There  is moderate atherosclerotic calcification of the thoracic aorta. Evaluation of the aorta and central pulmonary arteries are limited on this noncontrast CT. Partially visualized 3.9 cm aneurysm of the abdominal aorta. Dedicated CT of the abdomen pelvis with IV contrast is recommended for better evaluation. Mediastinum/Nodes: No definite hilar or mediastinal adenopathy. The esophagus is grossly unremarkable. No mediastinal fluid collection. Lungs/Pleura: Background of severe emphysema. Right apical subpleural scarring. Small bilateral pleural effusions with bibasilar atelectasis or infiltrate. There is a moderate size right pneumothorax, greater than 20%. A right-sided chest tube is noted with pigtail tip in the posterior pleural surface of the right upper lobe. The central airways are patent. Upper Abdomen: Colonic diverticulosis. Musculoskeletal: Osteopenia. No acute osseous pathology. Right chest wall soft tissue emphysema. No fluid collection. IMPRESSION: 1. Moderate-sized right pneumothorax, greater than 20%. A right-sided chest tube with pigtail tip in the posterior pleural surface of the right upper lobe. 2. Small bilateral pleural effusions with bibasilar atelectasis or infiltrate. 3. Partially visualized 3.9 cm abdominal aortic aneurysm. Dedicated CT of the abdomen pelvis with IV contrast is recommended for better evaluation. 4. Aortic Atherosclerosis (ICD10-I70.0) and Emphysema (ICD10-J43.9). Electronically Signed   By: Anner Crete M.D.   On: 03/23/2021 21:25        Scheduled Meds:  chlorhexidine  15 mL Mouth Rinse BID   Chlorhexidine Gluconate Cloth  6 each Topical Daily   enoxaparin (LOVENOX) injection  30 mg Subcutaneous Q24H   feeding supplement  237 mL Oral BID BM   ipratropium-albuterol  3 mL Nebulization QID   mouth rinse  15 mL Mouth Rinse q12n4p   mirtazapine  7.5 mg Oral QHS   mometasone-formoterol  2 puff Inhalation BID   tamsulosin  0.4 mg Oral  Daily   umeclidinium bromide  1 puff Inhalation Daily   Continuous Infusions:  lactated ringers 75 mL/hr at 03/23/21 0329     LOS: 5 days    Time spent: 61mins with >50% on coc    Nolberto Hanlon, MD [facility]   If 7PM-7AM, please contact night-coverage [email or web address]  03/24/2021, 8:59 AM

## 2021-03-24 NOTE — Progress Notes (Signed)
Initial Nutrition Assessment  DOCUMENTATION CODES:   Underweight, Severe malnutrition in context of chronic illness  INTERVENTION:   - Ensure Enlive po QID, each supplement provides 350 kcal and 20 grams of protein  - Liberalize diet to Regular, verbal with readback order placed per MD  - Provide feeding assistance as needed  NUTRITION DIAGNOSIS:   Severe Malnutrition related to chronic illness (COPD, dementia) as evidenced by severe muscle depletion, severe fat depletion.  GOAL:   Patient will meet greater than or equal to 90% of their needs  MONITOR:   PO intake, Supplement acceptance, Labs, Weight trends  REASON FOR ASSESSMENT:   Other (underweight BMI)    ASSESSMENT:   75 year old who presented to the ED on 7/10 from SNF with SOB. PMH of dementia, COPD, lung blebs, chronic respiratory failure, pneumothorax, bronchoalveolar fistula s/p pleurodesis, blindness. Pt admitted with right-sided pneumothorax, SIRS.  7/09 - chest tube inserted  Pt currently on a Heart Healthy diet with 25-100% meal completions charted. Discussed liberalizing diet to Regular with MD who agreed. Regular diet order placed.  Spoke with RN in pt's room who reports pt loves Ensure supplements. Pt with a 100% completed Ensure Enlive at bedside. Will increase Ensure order from BID to QID. RN also reports pt consumed 100% of breakfast meal this morning. RN states that yesterday pt did well at breakfast and lunch but only wanted a few bites of dinner. Pt did drink 2 full Ensure supplements yesterday.  Spoke with pt at bedside. Pt unable to provide any diet or weight history at this time secondary to dementia. Weight history in chart is limited as last available weight PTA was from 2016 (61.7 kg). This weight is well above pt's current weight. Pt with severe malnutrition.  Noted CTS to return today to discuss options with pt and family. Palliative Care team is following.  Admit weight: 43.5 kg Current  weight: 45.9 kg  Meal Completion: 25-100%  Medications reviewed and include: Ensure Enlive BID, remeron IVF: LR @ 75 ml/hr  Labs reviewed: potassium 3.4, phosphorus 2.3, hemoglobin 8.2  UOP: 1400 ml x 24 hours CT: 100 ml x 24 hours I/O's: +2.2 L since admit  NUTRITION - FOCUSED PHYSICAL EXAM:  Flowsheet Row Most Recent Value  Orbital Region Severe depletion  Upper Arm Region Severe depletion  Thoracic and Lumbar Region Severe depletion  Buccal Region Severe depletion  Temple Region Severe depletion  Clavicle Bone Region Severe depletion  Clavicle and Acromion Bone Region Severe depletion  Scapular Bone Region Severe depletion  Dorsal Hand Severe depletion  Patellar Region Severe depletion  Anterior Thigh Region Severe depletion  Posterior Calf Region Severe depletion  Edema (RD Assessment) None  Hair Reviewed  Eyes Reviewed  Mouth Reviewed  Skin Reviewed  Nails Reviewed    Diet Order:   Diet Order             Diet regular Room service appropriate? Yes; Fluid consistency: Thin  Diet effective now                   EDUCATION NEEDS:   Not appropriate for education at this time  Skin:  Skin Assessment: Reviewed RN Assessment  Last BM:  03/24/21 large type 5  Height:   Ht Readings from Last 1 Encounters:  03/24/2021 5\' 7"  (1.702 m)    Weight:   Wt Readings from Last 1 Encounters:  03/21/21 45.9 kg    BMI:  Body mass index is 15.85 kg/m.  Estimated  Nutritional Needs:   Kcal:  1450-1650  Protein:  65-80 grams  Fluid:  1.4-1.6 L    Gustavus Bryant, MS, RD, LDN Inpatient Clinical Dietitian Please see AMiON for contact information.

## 2021-03-25 DIAGNOSIS — J449 Chronic obstructive pulmonary disease, unspecified: Secondary | ICD-10-CM | POA: Diagnosis not present

## 2021-03-25 DIAGNOSIS — Z7189 Other specified counseling: Secondary | ICD-10-CM | POA: Diagnosis not present

## 2021-03-25 DIAGNOSIS — J939 Pneumothorax, unspecified: Secondary | ICD-10-CM

## 2021-03-25 DIAGNOSIS — R4189 Other symptoms and signs involving cognitive functions and awareness: Secondary | ICD-10-CM | POA: Diagnosis not present

## 2021-03-25 DIAGNOSIS — J9611 Chronic respiratory failure with hypoxia: Secondary | ICD-10-CM | POA: Diagnosis not present

## 2021-03-25 DIAGNOSIS — J439 Emphysema, unspecified: Secondary | ICD-10-CM | POA: Diagnosis not present

## 2021-03-25 DIAGNOSIS — E43 Unspecified severe protein-calorie malnutrition: Secondary | ICD-10-CM | POA: Insufficient documentation

## 2021-03-25 DIAGNOSIS — Z515 Encounter for palliative care: Secondary | ICD-10-CM | POA: Diagnosis not present

## 2021-03-25 NOTE — TOC Progression Note (Signed)
Transition of Care [facility]) - Progression Note    Patient Details  Name: Alan Huff MRN: [id number] Date of Birth: 04-Jul-1946  Transition of Care [facility]) CM/SW [place], [place] Phone Number: 03/25/2021, 3:29 PM  Clinical Narrative:     CSW contacted [facility] liaison to inquire about possibility for pt to move from [place] to [place] facility for LTC. CSW is informed they will not review pt for [facility] facility until chest tube is removed.   Expected Discharge Plan: [place] Barriers to Discharge: Continued Medical Work up, SNF Pending bed offer  Expected Discharge Plan and Services Expected Discharge Plan: [place] In-house Referral: Clinical Social Work     Living arrangements for the past 2 months: [place]                                       Social Determinants of Health (SDOH) Interventions    Readmission Risk Interventions No flowsheet data found.

## 2021-03-25 NOTE — TOC Initial Note (Signed)
Transition of Care [facility]) - Initial/Assessment Note    Patient Details  Name: Skanda Worlds MRN: [id number] Date of Birth: Jan 26, 1946  Transition of Care [facility]) CM/SW Contact:    Vinie Sill, LCSW Phone Number: 03/25/2021, 3:27 PM  Clinical Narrative:                  CSW received consult to call CSW spoke with patient's daughter, Curlene Dolphin.- patient is from [facility]. she confirmed her interest in exploring other LTC SNF facilities. CSW informed of possible bed offers. She states she is interested in [facility], its closer and the family can continue to engage in patient's care. She states the patient has medicare and medicaid.   TOC will begin search for other possible placement options. TOC will continue to follow and assist with discharge planning.  Thurmond Butts, MSW, LCSW Clinical Social Worker    Expected Discharge Plan: Skilled Nursing Facility Barriers to Discharge: Continued Medical Work up, SNF Pending bed offer   Patient Goals and CMS Choice Patient states their goals for this hospitalization and ongoing recovery are:: return to LTC      Expected Discharge Plan and Services Expected Discharge Plan: [place] In-house Referral: Clinical Social Work     Living arrangements for the past 2 months: [place]                                      Prior Living Arrangements/Services Living arrangements for the past 2 months: [place] Lives with:: Facility Resident Patient language and need for interpreter reviewed:: No Do you feel safe going back to the place where you live?: Yes      Need for Family Participation in Patient Care: Yes (Comment) Care giver support system in place?: Yes (comment)   Criminal Activity/Legal Involvement Pertinent to Current Situation/Hospitalization: No - Comment as needed  Activities of Daily Living Home Assistive Devices/Equipment: Blood pressure cuff,  Bedside commode/3-in-1, Hospital bed, Oxygen, Raised toilet seat with rails, Scales, Wheelchair ADL Screening (condition at time of admission) Patient's cognitive ability adequate to safely complete daily activities?: No Is the patient deaf or have difficulty hearing?: Yes Does the patient have difficulty seeing, even when wearing glasses/contacts?: No Does the patient have difficulty concentrating, remembering, or making decisions?: Yes Patient able to express need for assistance with ADLs?: No Does the patient have difficulty dressing or bathing?: Yes Independently performs ADLs?: No Communication: Independent Dressing (OT): Needs assistance Is this a change from baseline?: Pre-admission baseline Grooming: Needs assistance Is this a change from baseline?: Pre-admission baseline Feeding: Independent Is this a change from baseline?: Pre-admission baseline Bathing: Needs assistance Is this a change from baseline?: Pre-admission baseline Toileting: Needs assistance Is this a change from baseline?: Pre-admission baseline In/Out Bed: Needs assistance Is this a change from baseline?: Pre-admission baseline Walks in Home: Dependent Is this a change from baseline?: Pre-admission baseline Does the patient have difficulty walking or climbing stairs?: Yes Weakness of Legs: Both Weakness of Arms/Hands: Both  Permission Sought/Granted Permission sought to share information with : Family Supports    Share Information with NAME: Coggins,Shelli     Permission granted to share info w Relationship: daughter  Permission granted to share info w Contact Information: [phone]  Emotional Assessment       Orientation: : Oriented to Self Alcohol / Substance Use: Not Applicable Psych Involvement: No (comment)  Admission  diagnosis:  Acute respiratory distress [R06.03] Pneumothorax, right [J93.9] Pneumothorax [J93.9] Patient Active Problem List   Diagnosis Date Noted   Protein-calorie  malnutrition, severe 03/25/2021   Chronic respiratory failure with hypoxia ([place]) 03/22/2021   Pneumothorax, right 03/12/2021   COPD (chronic obstructive pulmonary disease) ([place]) 03/16/2021   Persistent cognitive impairment 02/28/2019   Lung blebs ([place]) 10/29/2014   Persistent air leak 10/28/2014   PCP:  [place]:   [address] [place]. [place] [address] Phone: [phone] Fax: [phone]     Social Determinants of Health (SDOH) Interventions    Readmission Risk Interventions No flowsheet data found.

## 2021-03-25 NOTE — Progress Notes (Signed)
[place] [facility] PROGRESS NOTE    [place] Huff  ZHG:[id number] DOB: 28-Feb-1946 DOA: 03/25/2021 PCP: [facility]      Brief Narrative:  [place] Huff is a 75 y.o. M with COPD, chronic respiratory failure on 2 to 4 L, history of pneumothorax, history of lung blebs, bronchioloalveolar fistula status post pleurodesis, bleb resection with VATS and minithoracotomy in 2016 who presented with respiratory distress, shortness of breath and chest discomfort.  In the ER chest x-ray showed right chest pneumothorax, chest tube was inserted, and his symptoms improved.        Assessment & Plan:  Acute on chronic hypoxic respiratory failure due to pneumothorax, right Right pneumothorax -Continue chest tube - Consult cardiothoracic surgery, appreciate cares -Consult palliative care   COPD No wheezing - Continue ICS/LABA/LAMA  Anemia Hgb trending down.  No flnical bleeding - Transfusion threshold 7 g/dL  Mood disorder -Continue mirtazapine  BPH -Continue Flomax  Severe protein calorie malnutrition As evidenced by severe lung failure, severely reduced muscle mass and fat, BMI less than 17. -Continue feeding supplements          Disposition: Status is: Inpatient  Remains inpatient appropriate because: chest ubte remains in place  Dispo: The patient is from: SNF              Anticipated d/c is to: SNF              Patient currently is not medically stable to d/c.   Difficult to place patient No    Patient presented with acute hypoxic respiratory failure due to pneumothorax.  He has a persistent pneumothorax, CT surgery will review his CT scan from 2 days ago, and offered therapies pending this.  Is disposition pending CT surgery recommendation   Level of care: Progressive       MDM: The below labs and imaging reports were reviewed and summarized above.  Medication management as above.    DVT prophylaxis: enoxaparin (LOVENOX) injection 30 mg  Start: 03/20/21 2000  Code Status: DNR Family Communication:           Subjective: He has some chest discomfort on the right side, otherwise no headache, dyspnea, abdominal pain, nausea.  Objective: Vitals:   03/25/21 0318 03/25/21 0704 03/25/21 0823 03/25/21 1104  BP: (!) 156/83 126/64  (!) 124/59  Pulse: 77 75  72  Resp: 18 18  18   Temp: (!) 97.3 F (36.3 C) 98.2 F (36.8 C)  98.6 F (37 C)  TempSrc: Axillary Oral  Oral  SpO2: 100% 100% 96% 100%  Weight:      Height:        Intake/Output Summary (Last 24 hours) at 03/25/2021 1420 Last data filed at 03/25/2021 1102 Gross per 24 hour  Intake 1271.01 ml  Output 2560 ml  Net -1288.99 ml   Filed Weights   03/14/2021 1610 04/04/2021 2331 03/21/21 0400  Weight: 52.2 kg 43.5 kg 45.9 kg    Examination: General appearance: Cachectic adult male, alert and in no acute distress.   HEENT: Anicteric, conjunctiva pink, lids and lashes normal. No nasal deformity, discharge, epistaxis.  Lips moist, edentulous, oropharynx tacky dry, no oral lesions.   Skin: Warm and dry.  No jaundice.  No suspicious rashes or lesions. Cardiac: RRR, nl S1-S2, no murmurs appreciated.  No LE edema.  Radial pulses 2+ and symmetric. Respiratory: Normal respiratory rate and rhythm.  Diminished bilaterally, I do not appreciate wheezing, he splints and does not get good air movement.  Abdomen: Abdomen soft.  no TTP. No ascites, distension, hepatosplenomegaly.   MSK: No deformities or effusions.  Severely reduced subcutaneous muscle mass of back. Neuro: Awake and responsive to questions.  EOMI, moves all extremities but with severe generalized weakness. Speech fluent.    Psych: Sensorium intact and responding to questions, attention diminished, affect blunted, judgment insight appear impaired    Data Reviewed: I have personally reviewed following labs and imaging studies:  CBC: Recent Labs  Lab 03/18/2021 1559 03/20/21 0211 03/21/21 0432 03/22/21 0508  03/23/21 0057  WBC 20.5* 11.5* 12.5* 10.6* 10.2  NEUTROABS 12.5*  --   --   --   --   HGB 10.7* 8.6* 7.8* 8.5* 8.2*  HCT 34.1* 27.6* 25.6* 27.3* 25.5*  MCV 100.0 99.3 99.6 98.6 95.9  PLT 444* 254 282 286 557   Basic Metabolic Panel: Recent Labs  Lab 03/18/2021 1559 03/22/2021 2328 03/20/21 0211 03/21/21 0432 03/22/21 0508 03/23/21 0057  NA 139  --  139 140 139 138  K 3.5  --  4.0 3.9 3.6 3.4*  CL 99  --  104 106 103 102  CO2 31  --  28 30 30  33*  GLUCOSE 193*  --  142* 90 95 111*  BUN 30*  --  32* 24* 25* 15  CREATININE 0.87 0.95 0.81 0.84 0.80 0.92  CALCIUM 8.8*  --  8.2* 8.0* 8.5* 8.1*  PHOS  --   --   --  2.5  --  2.3*   GFR: Estimated Creatinine Clearance: 44.3 mL/min (by C-G formula based on SCr of 0.92 mg/dL). Liver Function Tests: Recent Labs  Lab 04/02/2021 1559 03/21/21 0432 03/23/21 0057  AST 21  --   --   ALT 14  --   --   ALKPHOS 50  --   --   BILITOT 0.6  --   --   PROT 7.8  --   --   ALBUMIN 3.8 2.6* 2.4*   No results for input(s): LIPASE, AMYLASE in the last 168 hours. No results for input(s): AMMONIA in the last 168 hours. Coagulation Profile: Recent Labs  Lab 03/28/2021 1559  INR 1.0   Cardiac Enzymes: No results for input(s): CKTOTAL, CKMB, CKMBINDEX, TROPONINI in the last 168 hours. BNP (last 3 results) No results for input(s): PROBNP in the last 8760 hours. HbA1C: No results for input(s): HGBA1C in the last 72 hours. CBG: No results for input(s): GLUCAP in the last 168 hours. Lipid Profile: No results for input(s): CHOL, HDL, LDLCALC, TRIG, CHOLHDL, LDLDIRECT in the last 72 hours. Thyroid Function Tests: No results for input(s): TSH, T4TOTAL, FREET4, T3FREE, THYROIDAB in the last 72 hours. Anemia Panel: No results for input(s): VITAMINB12, FOLATE, FERRITIN, TIBC, IRON, RETICCTPCT in the last 72 hours. Urine analysis:    Component Value Date/Time   COLORURINE YELLOW 03/26/2021 2345   APPEARANCEUR HAZY (A) 04/06/2021 2345   LABSPEC 1.018  03/21/2021 2345   PHURINE 6.0 03/25/2021 2345   GLUCOSEU 50 (A) 04/01/2021 2345   HGBUR NEGATIVE 03/15/2021 2345   BILIRUBINUR NEGATIVE 03/12/2021 2345   KETONESUR 5 (A) 04/04/2021 2345   PROTEINUR 30 (A) 04/04/2021 2345   UROBILINOGEN 0.2 11/04/2014 1239   NITRITE NEGATIVE 03/22/2021 2345   LEUKOCYTESUR MODERATE (A) 03/22/2021 2345   Sepsis Labs: @LABRCNTIP (procalcitonin:4,lacticacidven:4)  ) Recent Results (from the past 240 hour(s))  Resp Panel by RT-PCR (Flu A&B, Covid) Nasopharyngeal Swab     Status: None   Collection Time: 03/17/2021  3:53 PM  Specimen: Nasopharyngeal Swab; Nasopharyngeal(NP) swabs in vial transport medium  Result Value Ref Range Status   SARS Coronavirus 2 by RT PCR NEGATIVE NEGATIVE Final    Comment: (NOTE) SARS-CoV-2 target nucleic acids are NOT DETECTED.  The SARS-CoV-2 RNA is generally detectable in upper respiratory specimens during the acute phase of infection. The lowest concentration of SARS-CoV-2 viral copies this assay can detect is 138 copies/mL. A negative result does not preclude SARS-Cov-2 infection and should not be used as the sole basis for treatment or other patient management decisions. A negative result may occur with  improper specimen collection/handling, submission of specimen other than nasopharyngeal swab, presence of viral mutation(s) within the areas targeted by this assay, and inadequate number of viral copies(<138 copies/mL). A negative result must be combined with clinical observations, patient history, and epidemiological information. The expected result is Negative.  Fact Sheet for Patients:  EntrepreneurPulse.com.au  Fact Sheet for Healthcare Providers:  IncredibleEmployment.be  This test is no t yet approved or cleared by the [place] FDA and  has been authorized for detection and/or diagnosis of SARS-CoV-2 by FDA under an Emergency Use Authorization (EUA). This EUA will  remain  in effect (meaning this test can be used) for the duration of the COVID-19 declaration under Section 564(b)(1) of the Act, 21 U.S.C.section 360bbb-3(b)(1), unless the authorization is terminated  or revoked sooner.       Influenza A by PCR NEGATIVE NEGATIVE Final   Influenza B by PCR NEGATIVE NEGATIVE Final    Comment: (NOTE) The Xpert Xpress SARS-CoV-2/FLU/RSV plus assay is intended as an aid in the diagnosis of influenza from Nasopharyngeal swab specimens and should not be used as a sole basis for treatment. Nasal washings and aspirates are unacceptable for Xpert Xpress SARS-CoV-2/FLU/RSV testing.  Fact Sheet for Patients: EntrepreneurPulse.com.au  Fact Sheet for Healthcare Providers: IncredibleEmployment.be  This test is not yet approved or cleared by the [place] FDA and has been authorized for detection and/or diagnosis of SARS-CoV-2 by FDA under an Emergency Use Authorization (EUA). This EUA will remain in effect (meaning this test can be used) for the duration of the COVID-19 declaration under Section 564(b)(1) of the Act, 21 U.S.C. section 360bbb-3(b)(1), unless the authorization is terminated or revoked.  Performed at [facility], [address], [place], [place] [address]   Blood Culture (routine x 2)     Status: None   Collection Time: 03/28/2021  3:59 PM   Specimen: BLOOD RIGHT ARM  Result Value Ref Range Status   Specimen Description   Final    BLOOD RIGHT ARM BOTTLES DRAWN AEROBIC AND ANAEROBIC   Special Requests Blood Culture adequate volume  Final   Culture   Final    NO GROWTH 5 DAYS Performed at [facility], [address], [place], [place] [address]    Report Status 03/24/2021 FINAL  Final  Blood Culture (routine x 2)     Status: None   Collection Time: 04/08/2021  4:15 PM   Specimen: Right Antecubital; Blood  Result Value Ref Range Status   Specimen Description   Final    RIGHT ANTECUBITAL BOTTLES DRAWN  AEROBIC AND ANAEROBIC   Special Requests Blood Culture adequate volume  Final   Culture   Final    NO GROWTH 5 DAYS Performed at [facility], [address] [place],  [address]    Report Status 03/24/2021 FINAL  Final  MRSA Next Gen by PCR, Nasal     Status: None   Collection  Time: 03/13/2021  4:29 PM   Specimen: Nasal Mucosa; Nasal Swab  Result Value Ref Range Status   MRSA by PCR Next Gen NOT DETECTED NOT DETECTED Final    Comment: (NOTE) The GeneXpert MRSA Assay (FDA approved for NASAL specimens only), is one component of a comprehensive MRSA colonization surveillance program. It is not intended to diagnose MRSA infection nor to guide or monitor treatment for MRSA infections. Test performance is not FDA approved in patients less than 81 years old. Performed at [facility], [address], [place], [place] [address]   Urine culture     Status: None   Collection Time: 03/13/2021 11:45 PM   Specimen: In/Out Cath Urine  Result Value Ref Range Status   Specimen Description   Final    IN/OUT CATH URINE Performed at [facility], [address] [place], [place] [address]    Special Requests   Final    NONE Performed at [facility], [address] [place], [place] [address]    Culture   Final    NO GROWTH Performed at [facility], [place] [address] [place], [place] [address]    Report Status 03/21/2021 FINAL  Final         Radiology Studies: CT CHEST WO CONTRAST  Result Date: 03/23/2021 CLINICAL DATA:  75 year old male with COPD and pneumothorax. EXAM: CT CHEST WITHOUT CONTRAST TECHNIQUE: Multidetector CT imaging of the chest was performed following the standard protocol without IV contrast. COMPARISON:  Chest CT dated 10/28/2014 and radiograph dated 03/22/2021. FINDINGS: Evaluation of this exam is limited in the absence of intravenous contrast. Cardiovascular: There is no cardiomegaly. Small pericardial effusion. Coronary vascular calcification. There is moderate  atherosclerotic calcification of the thoracic aorta. Evaluation of the aorta and central pulmonary arteries are limited on this noncontrast CT. Partially visualized 3.9 cm aneurysm of the abdominal aorta. Dedicated CT of the abdomen pelvis with IV contrast is recommended for better evaluation. Mediastinum/Nodes: No definite hilar or mediastinal adenopathy. The esophagus is grossly unremarkable. No mediastinal fluid collection. Lungs/Pleura: Background of severe emphysema. Right apical subpleural scarring. Small bilateral pleural effusions with bibasilar atelectasis or infiltrate. There is a moderate size right pneumothorax, greater than 20%. A right-sided chest tube is noted with pigtail tip in the posterior pleural surface of the right upper lobe. The central airways are patent. Upper Abdomen: Colonic diverticulosis. Musculoskeletal: Osteopenia. No acute osseous pathology. Right chest wall soft tissue emphysema. No fluid collection. IMPRESSION: 1. Moderate-sized right pneumothorax, greater than 20%. A right-sided chest tube with pigtail tip in the posterior pleural surface of the right upper lobe. 2. Small bilateral pleural effusions with bibasilar atelectasis or infiltrate. 3. Partially visualized 3.9 cm abdominal aortic aneurysm. Dedicated CT of the abdomen pelvis with IV contrast is recommended for better evaluation. 4. Aortic Atherosclerosis (ICD10-I70.0) and Emphysema (ICD10-J43.9). Electronically Signed   By: Anner Crete M.D.   On: 03/23/2021 21:25        Scheduled Meds:  chlorhexidine  15 mL Mouth Rinse BID   enoxaparin (LOVENOX) injection  30 mg Subcutaneous Q24H   feeding supplement  237 mL Oral QID   mouth rinse  15 mL Mouth Rinse q12n4p   mirtazapine  7.5 mg Oral QHS   mometasone-formoterol  2 puff Inhalation BID   tamsulosin  0.4 mg Oral Daily   umeclidinium bromide  1 puff Inhalation Daily   Continuous Infusions:     LOS: 6 days    Time spent: 25 minutes  Edwin Dada, MD [facility] 03/25/2021, 2:20 PM     Please page though [place] or Epic secure chat:  For [employer], Adult nurse

## 2021-03-25 NOTE — Progress Notes (Signed)
     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  This is likely the source of his air leak.  He is very debilitated and given the integrity of his lung I think that surgical bleb resection would likely lead to more air leaks.  Additionally with his poor pulmonary reserve limiting extensive wedge resection would likely lead to worsening of his respiratory status.  I discussed these details with the patient's daughter, and we both agree that conservative therapy will be his best option.  We will plan to transition him to a mini express, and encourage pulmonary toilet until the leak seals.  Pepper Wyndham Bary Leriche

## 2021-03-25 NOTE — Progress Notes (Addendum)
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  He was found to have large right-sided pneumothorax and had chest tube placed in the [facility] emergency room.  Pneumothorax resolved after chest tube placement, but has had a persistent air leak since then.  Palliative care following to help address goals of care.  General surgery is also been following for chest tube management.  At this point, does not appear to be reasonable to remove chest tube due to persistent air leak.  Patient is being transferred to [facility] for cardiothoracic surgery/pulmonology consultation to see if there are any less invasive options to help manage pneumothorax.  Palliative care was asked to continue along with goals of care conversations for [facility] pending CTS evaluation.  Today's Discussion (03/25/2021):  *Please note that this is a verbal dictation therefore any spelling or grammatical errors are due to the "[place] [employer]" system interpretation.  Chart reviewed. Patient has been tolerating right sided chest tube.   Per CTS plan to transition to a portable chest tube system and re-evaluate in the next few weeks. There are presently no plans for surgical interventions at this time.   I met with Magda Paganini at bedside. He is disoriented on exam. He shares with me that he is from [place], [place] and laments on his life there. He is unaware of present situation. I reoriented him as able to person, place, time, and situation.  No family present at bedside this afternoon. Provided "Hard Choices for Loving People" booklet and MOST form for review and completion. I called patients daughter, [name] via telephone though was unable to leave a VM. I will continue to follow along and offer support throughout the  weekend.   Questions and concerns addressed   Objective Assessment: Vital Signs Vitals:   03/25/21 0823 03/25/21 1104  BP:  (!) 124/59  Pulse:  72  Resp:  18  Temp:  98.6 F (37 C)  SpO2: 96% 100%    Intake/Output Summary (Last 24 hours) at 03/25/2021 1414 Last data filed at 03/25/2021 1102 Gross per 24 hour  Intake 1271.01 ml  Output 2560 ml  Net -1288.99 ml   Last Weight  Most recent update: 03/21/2021  4:02 AM    Weight  45.9 kg (101 lb 3.1 oz)            Physical Exam Vitals and nursing note reviewed. Constitutional:      Comments: Frail, cachetic, disheveled  Pulmonary:On 3LPM [place] Skin:    General: Skin is warm and dry.    Coloration: Skin is pale with multiple areas of ecchymosis Neurological:    Mental Status: He is disoriented.  SUMMARY OF RECOMMENDATIONS   DNAR/DNI  Evaluated by Dr. Kipp Brood on 7/13 a repeat CT Scan was completed thereafter: Per secure chat plan for portable Chest tube and to reassess in a few weeks. No surgical plans presently.   TOC - Close OP Palliative support at [place]  PMT will continue to offer support throughout the weekend  Time Spent: 25 Greater than 50% of the time was spent in counseling and coordination of care ______________________________________________________________________________________ [place] Team Team Cell Phone: [phone] Please utilize secure chat with additional questions, if there is no response within 30 minutes please call the above phone number  Palliative Medicine Team providers are available by phone from 7am to 7pm daily and can be reached through the team cell phone.  Should this patient require assistance outside of these hours, please call the patient's attending physician.

## 2021-03-26 DIAGNOSIS — Z66 Do not resuscitate: Secondary | ICD-10-CM | POA: Diagnosis not present

## 2021-03-26 DIAGNOSIS — Z515 Encounter for palliative care: Secondary | ICD-10-CM | POA: Diagnosis not present

## 2021-03-26 DIAGNOSIS — Z7189 Other specified counseling: Secondary | ICD-10-CM | POA: Diagnosis not present

## 2021-03-26 NOTE — Progress Notes (Signed)
      [address]       ,[place] [address]             [phone]         Subjective: Breathing is comfortable at rest   Objective: Vital signs in last 24 hours: Temp:  [97.7 F (36.5 C)-98.6 F (37 C)] 97.8 F (36.6 C) (07/16 0705) Pulse Rate:  [69-87] 76 (07/16 0705) Cardiac Rhythm: Normal sinus rhythm (07/16 0742) Resp:  [17-21] 18 (07/16 0705) BP: (120-132)/(55-85) 132/85 (07/16 0705) SpO2:  [96 %-100 %] 100 % (07/16 0829)  Hemodynamic parameters for last 24 hours:    Intake/Output from previous day: 07/15 0701 - 07/16 0700 In: -  Out: 3165 [Urine:3075; Chest Tube:90] Intake/Output this shift: No intake/output data recorded.  General appearance: alert, cooperative, distracted, and no distress Heart: regular rate and rhythm Lungs: fair air exchange throughout  Lab Results: No results for input(s): WBC, HGB, HCT, PLT in the last 72 hours. BMET: No results for input(s): NA, K, CL, CO2, GLUCOSE, BUN, CREATININE, CALCIUM in the last 72 hours.  PT/INR: No results for input(s): LABPROT, INR in the last 72 hours. ABG    Component Value Date/Time   PHART 7.220 (L) 04/07/2021 1633   HCO3 26.7 04/06/2021 1633   TCO2 25.7 10/30/2014 0500   O2SAT 91.3 03/11/2021 1633   CBG (last 3)  No results for input(s): GLUCAP in the last 72 hours.  Meds Scheduled Meds:  chlorhexidine  15 mL Mouth Rinse BID   enoxaparin (LOVENOX) injection  30 mg Subcutaneous Q24H   feeding supplement  237 mL Oral QID   mouth rinse  15 mL Mouth Rinse q12n4p   mirtazapine  7.5 mg Oral QHS   mometasone-formoterol  2 puff Inhalation BID   tamsulosin  0.4 mg Oral Daily   umeclidinium bromide  1 puff Inhalation Daily   Continuous Infusions: PRN Meds:.acetaminophen **OR** acetaminophen, ipratropium-albuterol, morphine injection  Xrays No results found.  Assessment/Plan:  1 afeb, VSS 2 sats good on 3 liters 3 CT 90 cc/24 h, 1+ air leak, cont to suction for now 4 no new  labs or CXR's- will get CXR in am     LOS: 7 days    John Giovanni 03/26/2021

## 2021-03-26 NOTE — Progress Notes (Signed)
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  He was found to have large right-sided pneumothorax and had chest tube placed in the [facility] emergency room.  Pneumothorax resolved after chest tube placement, but has had a persistent air leak since then.  Palliative care following to help address goals of care.  General surgery is also been following for chest tube management.  At this point, does not appear to be reasonable to remove chest tube due to persistent air leak.  Patient is being transferred to [facility] for cardiothoracic surgery/pulmonology consultation to see if there are any less invasive options to help manage pneumothorax.  Palliative care was asked to continue along with goals of care conversations for [facility] pending CTS evaluation.  Today's Discussion (03/26/2021):  *Please note that this is a verbal dictation therefore any spelling or grammatical errors are due to the "[place] One" system interpretation.  Chart reviewed. Patients right sided chest tube remains in place to suction. CTS is following along and managing.  Patient appears to be resting comfortably this morning upon assessment with no nonverbal s/s of distress.   I spoke to patients daughter, Alan Huff she reviewed with me the plan per the CTS team. She shares the hope for continued improvements and for Alan Huff to transition from [facility] in [place] to [facility] facility for LTC. She reviewed that the chest tube will potentially be a problem though she share that she would be willing to do the dressing changes and management. This informations was shared with the MSW.  Questions and concerns addressed   Objective Assessment: Vital Signs Vitals:   03/26/21 0705 03/26/21 0829  BP: 132/85    Pulse: 76   Resp: 18   Temp: 97.8 F (36.6 C)   SpO2: 100% 100%    Intake/Output Summary (Last 24 hours) at 03/26/2021 1138 Last data filed at 03/26/2021 0600 Gross per 24 hour  Intake --  Output 1665 ml  Net -1665 ml    Last Weight  Most recent update: 03/21/2021  4:02 AM    Weight  45.9 kg (101 lb 3.1 oz)            Physical Exam Vitals and nursing note reviewed. Constitutional:      Comments: Frail, cachetic, disheveled  Pulmonary:On 3LPM [place] Skin:    General: Skin is warm and dry.    Coloration: Skin is pale with multiple areas of ecchymosis Neurological:    Mental Status: He is disoriented.  SUMMARY OF RECOMMENDATIONS   DNAR/DNI  Evaluated by Dr. Kipp Brood on 7/13 a repeat CT Scan was completed thereafter: Per secure chat plan for portable Chest tube and to reassess in a few weeks. No surgical plans presently.   TOC - Close OP Palliative support at [place] MSW confirming if [facility] can accept [place] with a chest tube  PMT will continue to offer support throughout the weekend  Time Spent: 25 Greater than 50% of the time was spent in counseling and coordination of care ______________________________________________________________________________________ [place] Team Team Cell Phone: [phone] Please utilize secure chat with additional questions, if there is no response within 30 minutes please call the above phone number  Palliative Medicine Team providers are available by phone from 7am to 7pm daily and can  be reached through the team cell phone.  Should this patient require assistance outside of these hours, please call the patient's attending physician.

## 2021-03-26 NOTE — Progress Notes (Signed)
PROGRESS NOTE  Alan Huff SWF:[id number] DOB: March 16, 1946 DOA: 03/13/2021 PCP: [place]  HPI/Recap of past 24 hours: This is a 75 year old male with COPD, chronic respiratory failure on 3 L/min to 4 L/min home O2 history of pneumothorax, history of lung blebs, bronchial alveolar fistula status post pleurodesis.  He presented with respiratory distress and chest discomfort chest x-ray in the ER showed right chest pneumothorax.  Chest tube was inserted and his symptom has improved.  March 26, 2021: Patient seen and examined at bedside he stated he is feeling fine no new complaint  Assessment/Plan: Active Problems:   Lung blebs ([place])   Persistent cognitive impairment   Pneumothorax, right   COPD (chronic obstructive pulmonary disease) (HCC)   Chronic respiratory failure with hypoxia (HCC)   Protein-calorie malnutrition, severe  1.  Acute on chronic hypoxic respiratory failure due to pneumothorax Right pneumothorax Chest tube in place and seal is good cardiothoracic surgery is following Palliative consult following  2.  COPD.  Stable Continue inhalers ICS/LABA/LAMA  3.  Anemia No active bleeding Will monitor hemoglobin Will transfuse if hemoglobin drops to 7  4.  Mood disorder continue mirtazapine  5.  BPH continue Flomax  6.  Severe protein calorie malnutrition Continue supplemental feeding  Code Status: DNR  Severity of Illness: The appropriate patient status for this patient is INPATIENT. Inpatient status is judged to be reasonable and necessary in order to provide the required intensity of service to ensure the patient's safety. The patient's presenting symptoms, physical exam findings, and initial radiographic and laboratory data in the context of their chronic comorbidities is felt to place them at high risk for further clinical deterioration. Furthermore, it is not anticipated that the patient will be medically stable for discharge from the hospital within 2  midnights of admission. The following factors support the patient status of inpatient.   " Has a chest tube that is being monitored   * I certify that at the point of admission it is my clinical judgment that the patient will require inpatient hospital care spanning beyond 2 midnights from the point of admission due to high intensity of service, high risk for further deterioration and high frequency of surveillance required.*   Family Communication: None at bedside  Disposition Plan: To be determined   consultants: Cardiothoracic Palliative care  Procedures: Thoracentesis  Antimicrobials: None  DVT prophylaxis: Lovenox   Objective: Vitals:   03/25/21 2329 03/26/21 0305 03/26/21 0705 03/26/21 0829  BP: 130/66 (!) 120/56 132/85   Pulse: 78 69 76   Resp: 17 18 18    Temp: 97.7 F (36.5 C) 98.6 F (37 C) 97.8 F (36.6 C)   TempSrc: Axillary Axillary Oral   SpO2: 99% 100% 100% 100%  Weight:      Height:        Intake/Output Summary (Last 24 hours) at 03/26/2021 0945 Last data filed at 03/26/2021 0600 Gross per 24 hour  Intake --  Output 2315 ml  Net -2315 ml   Filed Weights   03/16/2021 1610 03/24/2021 2331 03/21/21 0400  Weight: 52.2 kg 43.5 kg 45.9 kg   Body mass index is 15.85 kg/m.  Exam:  General: 75 y.o. year-old male well developed well nourished in no acute distress.  Alert and oriented x3. Cardiovascular: Regular rate and rhythm with no rubs or gallops.  No thyromegaly or JVD noted.   Respiratory: Clear to auscultation with no wheezes or rales. Good inspiratory effort. Abdomen: Soft nontender nondistended with normal bowel  sounds x4 quadrants. Musculoskeletal: No lower extremity edema. 2/4 pulses in all 4 extremities. Skin: No ulcerative lesions noted or rashes, Psychiatry: Mood is appropriate for condition and setting    Data Reviewed: CBC: Recent Labs  Lab 04/03/2021 1559 03/20/21 0211 03/21/21 0432 03/22/21 0508 03/23/21 0057  WBC 20.5* 11.5*  12.5* 10.6* 10.2  NEUTROABS 12.5*  --   --   --   --   HGB 10.7* 8.6* 7.8* 8.5* 8.2*  HCT 34.1* 27.6* 25.6* 27.3* 25.5*  MCV 100.0 99.3 99.6 98.6 95.9  PLT 444* 254 282 286 858   Basic Metabolic Panel: Recent Labs  Lab 03/18/2021 1559 04/07/2021 2328 03/20/21 0211 03/21/21 0432 03/22/21 0508 03/23/21 0057  NA 139  --  139 140 139 138  K 3.5  --  4.0 3.9 3.6 3.4*  CL 99  --  104 106 103 102  CO2 31  --  28 30 30  33*  GLUCOSE 193*  --  142* 90 95 111*  BUN 30*  --  32* 24* 25* 15  CREATININE 0.87 0.95 0.81 0.84 0.80 0.92  CALCIUM 8.8*  --  8.2* 8.0* 8.5* 8.1*  PHOS  --   --   --  2.5  --  2.3*   GFR: Estimated Creatinine Clearance: 44.3 mL/min (by C-G formula based on SCr of 0.92 mg/dL). Liver Function Tests: Recent Labs  Lab 03/13/2021 1559 03/21/21 0432 03/23/21 0057  AST 21  --   --   ALT 14  --   --   ALKPHOS 50  --   --   BILITOT 0.6  --   --   PROT 7.8  --   --   ALBUMIN 3.8 2.6* 2.4*   No results for input(s): LIPASE, AMYLASE in the last 168 hours. No results for input(s): AMMONIA in the last 168 hours. Coagulation Profile: Recent Labs  Lab 04/04/2021 1559  INR 1.0   Cardiac Enzymes: No results for input(s): CKTOTAL, CKMB, CKMBINDEX, TROPONINI in the last 168 hours. BNP (last 3 results) No results for input(s): PROBNP in the last 8760 hours. HbA1C: No results for input(s): HGBA1C in the last 72 hours. CBG: No results for input(s): GLUCAP in the last 168 hours. Lipid Profile: No results for input(s): CHOL, HDL, LDLCALC, TRIG, CHOLHDL, LDLDIRECT in the last 72 hours. Thyroid Function Tests: No results for input(s): TSH, T4TOTAL, FREET4, T3FREE, THYROIDAB in the last 72 hours. Anemia Panel: No results for input(s): VITAMINB12, FOLATE, FERRITIN, TIBC, IRON, RETICCTPCT in the last 72 hours. Urine analysis:    Component Value Date/Time   COLORURINE YELLOW 03/17/2021 2345   APPEARANCEUR HAZY (A) 03/14/2021 2345   LABSPEC 1.018 03/30/2021 2345   PHURINE 6.0  03/15/2021 2345   GLUCOSEU 50 (A) 04/04/2021 2345   HGBUR NEGATIVE 04/03/2021 2345   BILIRUBINUR NEGATIVE 04/07/2021 2345   KETONESUR 5 (A) 03/31/2021 2345   PROTEINUR 30 (A) 03/18/2021 2345   UROBILINOGEN 0.2 11/04/2014 1239   NITRITE NEGATIVE 04/10/2021 2345   LEUKOCYTESUR MODERATE (A) 04/07/2021 2345   Sepsis Labs: @LABRCNTIP (procalcitonin:4,lacticidven:4)  ) Recent Results (from the past 240 hour(s))  Resp Panel by RT-PCR (Flu A&B, Covid) Nasopharyngeal Swab     Status: None   Collection Time: 03/13/2021  3:53 PM   Specimen: Nasopharyngeal Swab; Nasopharyngeal(NP) swabs in vial transport medium  Result Value Ref Range Status   SARS Coronavirus 2 by RT PCR NEGATIVE NEGATIVE Final    Comment: (NOTE) SARS-CoV-2 target nucleic acids are NOT DETECTED.  The SARS-CoV-2  RNA is generally detectable in upper respiratory specimens during the acute phase of infection. The lowest concentration of SARS-CoV-2 viral copies this assay can detect is 138 copies/mL. A negative result does not preclude SARS-Cov-2 infection and should not be used as the sole basis for treatment or other patient management decisions. A negative result may occur with  improper specimen collection/handling, submission of specimen other than nasopharyngeal swab, presence of viral mutation(s) within the areas targeted by this assay, and inadequate number of viral copies(<138 copies/mL). A negative result must be combined with clinical observations, patient history, and epidemiological information. The expected result is Negative.  Fact Sheet for Patients:  EntrepreneurPulse.com.au  Fact Sheet for Healthcare Providers:  IncredibleEmployment.be  This test is no t yet approved or cleared by the [place] FDA and  has been authorized for detection and/or diagnosis of SARS-CoV-2 by FDA under an Emergency Use Authorization (EUA). This EUA will remain  in effect (meaning this test  can be used) for the duration of the COVID-19 declaration under Section 564(b)(1) of the Act, 21 U.S.C.section 360bbb-3(b)(1), unless the authorization is terminated  or revoked sooner.       Influenza A by PCR NEGATIVE NEGATIVE Final   Influenza B by PCR NEGATIVE NEGATIVE Final    Comment: (NOTE) The Xpert Xpress SARS-CoV-2/FLU/RSV plus assay is intended as an aid in the diagnosis of influenza from Nasopharyngeal swab specimens and should not be used as a sole basis for treatment. Nasal washings and aspirates are unacceptable for Xpert Xpress SARS-CoV-2/FLU/RSV testing.  Fact Sheet for Patients: EntrepreneurPulse.com.au  Fact Sheet for Healthcare Providers: IncredibleEmployment.be  This test is not yet approved or cleared by the [place] FDA and has been authorized for detection and/or diagnosis of SARS-CoV-2 by FDA under an Emergency Use Authorization (EUA). This EUA will remain in effect (meaning this test can be used) for the duration of the COVID-19 declaration under Section 564(b)(1) of the Act, 21 U.S.C. section 360bbb-3(b)(1), unless the authorization is terminated or revoked.  Performed at [facility], [address], [place], [place] [address]   Blood Culture (routine x 2)     Status: None   Collection Time: 04/07/2021  3:59 PM   Specimen: BLOOD RIGHT ARM  Result Value Ref Range Status   Specimen Description   Final    BLOOD RIGHT ARM BOTTLES DRAWN AEROBIC AND ANAEROBIC   Special Requests Blood Culture adequate volume  Final   Culture   Final    NO GROWTH 5 DAYS Performed at [facility], [address] [place], [place] [address]    Report Status 03/24/2021 FINAL  Final  Blood Culture (routine x 2)     Status: None   Collection Time: 03/14/2021  4:15 PM   Specimen: Right Antecubital; Blood  Result Value Ref Range Status   Specimen Description   Final    RIGHT ANTECUBITAL BOTTLES DRAWN AEROBIC AND ANAEROBIC   Special  Requests Blood Culture adequate volume  Final   Culture   Final    NO GROWTH 5 DAYS Performed at [facility], [address] [place], [place] [address]    Report Status 03/24/2021 FINAL  Final  MRSA Next Gen by PCR, Nasal     Status: None   Collection Time: 04/09/2021  4:29 PM   Specimen: Nasal Mucosa; Nasal Swab  Result Value Ref Range Status   MRSA by PCR Next Gen NOT DETECTED NOT DETECTED Final    Comment: (NOTE) The GeneXpert MRSA Assay (FDA approved  for NASAL specimens only), is one component of a comprehensive MRSA colonization surveillance program. It is not intended to diagnose MRSA infection nor to guide or monitor treatment for MRSA infections. Test performance is not FDA approved in patients less than 17 years old. Performed at [facility], [address] [place], [place] [address]   Urine culture     Status: None   Collection Time: 03/30/2021 11:45 PM   Specimen: In/Out Cath Urine  Result Value Ref Range Status   Specimen Description   Final    IN/OUT CATH URINE Performed at [facility], [address] [place], [place] [address]    Special Requests   Final    NONE Performed at [facility], [address] [place], [place] [address]    Culture   Final    NO GROWTH Performed at [facility], [place] [address] [place], [place] [address]    Report Status 03/21/2021 FINAL  Final      Studies: No results found.  Scheduled Meds:  chlorhexidine  15 mL Mouth Rinse BID   enoxaparin (LOVENOX) injection  30 mg Subcutaneous Q24H   feeding supplement  237 mL Oral QID   mouth rinse  15 mL Mouth Rinse q12n4p   mirtazapine  7.5 mg Oral QHS   mometasone-formoterol  2 puff Inhalation BID   tamsulosin  0.4 mg Oral Daily   umeclidinium bromide  1 puff Inhalation Daily    Continuous Infusions:   LOS: 7 days     Cristal Deer, MD [facility]  To reach me or the doctor on call, go to: [email or web address] Password [email or web address]  03/26/2021, 9:45 AM

## 2021-03-27 ENCOUNTER — Inpatient Hospital Stay (HOSPITAL_COMMUNITY): Payer: No Typology Code available for payment source

## 2021-03-27 DIAGNOSIS — Z66 Do not resuscitate: Secondary | ICD-10-CM | POA: Diagnosis not present

## 2021-03-27 DIAGNOSIS — Z515 Encounter for palliative care: Secondary | ICD-10-CM | POA: Diagnosis not present

## 2021-03-27 DIAGNOSIS — Z7189 Other specified counseling: Secondary | ICD-10-CM | POA: Diagnosis not present

## 2021-03-27 NOTE — Plan of Care (Signed)

## 2021-03-27 NOTE — Progress Notes (Signed)
   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  He was found to have large right-sided pneumothorax and had chest tube placed in the [facility] emergency room.  Pneumothorax resolved after chest tube placement, but has had a persistent air leak since then.  Palliative care following to help address goals of care.  General surgery is also been following for chest tube management.  At this point, does not appear to be reasonable to remove chest tube due to persistent air leak.  Patient is being transferred to [facility] for cardiothoracic surgery/pulmonology consultation to see if there are any less invasive options to help manage pneumothorax.  Palliative care was asked to continue along with goals of care conversations for [facility] pending CTS evaluation.  Today's Discussion (03/27/2021):  *Please note that this is a verbal dictation therefore any spelling or grammatical errors are due to the "[place] One" system interpretation.  Chart reviewed.  Patient has been eating 50-100% of meals. CTS have seen Magda Paganini today and has had improvement in his pneumothorax with present chest tube.   [place] seen at bedside this afternoon. He had just worked with PT and shares that this lifted his spirits. We reviewed the plan for no surgery and the reasons why that has been opted against.   Questions and concerns addressed   Objective Assessment: Vital Signs Vitals:   03/27/21 0927 03/27/21 1100  BP:  (!) 111/58  Pulse:  69  Resp:  18  Temp:  98.3 F (36.8 C)  SpO2: 100% 99%    Intake/Output Summary (Last 24 hours) at 03/27/2021 1358 Last data filed at 03/27/2021 0933 Gross per 24 hour  Intake 360 ml  Output 530 ml  Net -170 ml    Last Weight  Most recent update: 03/21/2021  4:02 AM     Weight  45.9 kg (101 lb 3.1 oz)            Physical Exam Vitals and nursing note reviewed. Constitutional:      Comments: Frail, cachetic, disheveled  Pulmonary:On 3LPM  Skin:    General: Skin is warm and dry.    Coloration: Skin is pale with multiple areas of ecchymosis Neurological:    Mental Status: He is oriented to person  SUMMARY OF RECOMMENDATIONS   DNAR/DNI  No plans for surgery per CTS they remain to manage the chest tube  TOC - Close OP Palliative support at [employer] have confirmed this is okay with patients daughter, Judene Companion MSW confirming if [facility] can accept [place] with a chest tube. Also appreciate help transitioning from center in [place] to [place].  Appreciate PT/OT  PMT will continue to offer support throughout the weekend  Time Spent: 25 Greater than 50% of the time was spent in counseling and coordination of care ______________________________________________________________________________________ [place] Team Team Cell Phone: [phone] Please utilize secure chat with additional questions, if there is no response within 30 minutes please call the above phone number  Palliative Medicine Team providers are available by phone from 7am to 7pm daily and can be reached through the team cell phone.  Should this patient require assistance outside of these hours, please call the patient's attending physician.

## 2021-03-27 NOTE — Evaluation (Signed)
Physical Therapy Evaluation Patient Details Name: Alan Huff MRN: [id number] DOB: 05-Jan-1946 Today's Date: 03/27/2021   History of Present Illness  Pt is a 75 y.o. M who presents with large right sided PTX and had chest tube placed. Significant PMH: COPD on 2-4L, prior history of PTX, cognitive impairment.  Clinical Impression  Prior to admission, pt resides at a SNF, is dependent for ADL's, and has not stood in ~1.5 years. Session focused on bed mobility and therapeutic exercises for strengthening. Pt requiring min-max assist for bed mobility. Able to tolerate sitting edge of bed ~10 minutes, SpO2 98-100% on 3L O2. Will continue to follow acutely.     Follow Up Recommendations SNF    Equipment Recommendations  None recommended by PT    Recommendations for Other Services       Precautions / Restrictions Precautions Precautions: Fall;Other (comment) Precaution Comments: chest tube, low vision Restrictions Weight Bearing Restrictions: No      Mobility  Bed Mobility Overal bed mobility: Needs Assistance Bed Mobility: Supine to Sit;Sit to Supine;Rolling Rolling: Min assist   Supine to sit: Max assist Sit to supine: Min assist   General bed mobility comments: Pt requiring maxA to progress to edge of bed, decreased initiation. Able to return to bed with minA for trunk control. MinA to roll towards R/L for pad and brief changing    Transfers                 General transfer comment: deferred; has not stood in 1.5 years  Ambulation/Gait                Stairs            Wheelchair Mobility    Modified Rankin (Stroke Patients Only)       Balance Overall balance assessment: Needs assistance Sitting-balance support: Feet unsupported Sitting balance-Leahy Scale: Fair                                       Pertinent Vitals/Pain Pain Assessment: Faces Faces Pain Scale: Hurts little more Pain Location: generalized Pain Descriptors  / Indicators: Grimacing Pain Intervention(s): Monitored during session    Home Living Family/patient expects to be discharged to:: Skilled nursing facility                 Additional Comments: ([facility]ust[facility] care at [facility])    Prior Function Level of Independence: Needs assistance   Gait / Transfers Assistance Needed: has not stood in 1.5 years  ADL's / Homemaking Assistance Needed: assist for ADL's, pt reports ability to self feed        Hand Dominance        Extremity/Trunk Assessment   Upper Extremity Assessment Upper Extremity Assessment: Generalized weakness    Lower Extremity Assessment Lower Extremity Assessment: Generalized weakness    Cervical / Trunk Assessment Cervical / Trunk Assessment: Kyphotic  Communication   Communication: HOH  Cognition Arousal/Alertness: Awake/alert Behavior During Therapy: Flat affect Overall Cognitive Status: History of cognitive impairments - at baseline                                 General Comments: Pt very HOH, able to follow 1 step commands, has difficulty with recall      General Comments General comments (skin integrity, edema, etc.): VSS on 3L O2  Exercises General Exercises - Lower Extremity Ankle Circles/Pumps: Both;20 reps;Seated Long Arc Quad: Both;10 reps;Seated   Assessment/Plan    PT Assessment Patient needs continued PT services  PT Problem List Decreased strength;Decreased activity tolerance;Decreased balance;Decreased mobility;Decreased cognition       PT Treatment Interventions Functional mobility training;Therapeutic activities;Therapeutic exercise;Balance training;Patient/family education    PT Goals (Current goals can be found in the Care Plan section)  Acute Rehab PT Goals Patient Stated Goal: did not state PT Goal Formulation: With patient Time For Goal Achievement: 04/10/21 Potential to Achieve Goals: Fair    Frequency Min 2X/week   Barriers to discharge         Co-evaluation               AM-PAC PT "6 Clicks" Mobility  Outcome Measure Help needed turning from your back to your side while in a flat bed without using bedrails?: A Little Help needed moving from lying on your back to sitting on the side of a flat bed without using bedrails?: A Lot Help needed moving to and from a bed to a chair (including a wheelchair)?: Total Help needed standing up from a chair using your arms (e.g., wheelchair or bedside chair)?: Total Help needed to walk in hospital room?: Total Help needed climbing 3-5 steps with a railing? : Total 6 Click Score: 9    End of Session Equipment Utilized During Treatment: Oxygen Activity Tolerance: Patient tolerated treatment well Patient left: in bed;with call bell/phone within reach;with bed alarm set Nurse Communication: Mobility status PT Visit Diagnosis: Muscle weakness (generalized) (M62.81);Other abnormalities of gait and mobility (R26.89)    Time: 2952-8413 PT Time Calculation (min) (ACUTE ONLY): 29 min   Charges:   PT Evaluation $PT Eval Moderate Complexity: 1 Mod PT Treatments $Therapeutic Activity: 8-22 mins        Wyona Almas, PT, DPT Acute Rehabilitation Services Pager [phone] Office [phone]   Deno Etienne 03/27/2021, 4:23 PM

## 2021-03-27 NOTE — Progress Notes (Signed)
PROGRESS NOTE  Alan Huff IRS:[id number] DOB: 24-May-1946 DOA: 04/09/2021 PCP: [facility]  HPI/Recap of past 24 hours: This is a 75 year old male with COPD, chronic respiratory failure on 3 L/min to 4 L/min home O2 history of pneumothorax, history of lung blebs, bronchial alveolar fistula status post pleurodesis.  He presented with respiratory distress and chest discomfort chest x-ray in the ER showed right chest pneumothorax.  Chest tube was inserted and his symptom has improved.  March 26, 2021: Patient seen and examined at bedside he stated he is feeling fine no new complaint  March 27, 2021: Patient seen and examined at bedside he denies any new complain.  He still has his chest tube which is being managed by vascular cardiothoracic  Assessment/Plan: Active Problems:   Lung blebs ([place])   Persistent cognitive impairment   Pneumothorax, right   COPD (chronic obstructive pulmonary disease) (HCC)   Chronic respiratory failure with hypoxia (HCC)   Protein-calorie malnutrition, severe  1.  Acute on chronic hypoxic respiratory failure due to pneumothorax Right pneumothorax Chest tube in place and seal is good cardiothoracic surgery is following Palliative consult following  2.  COPD.  Stable Continue inhalers ICS/LABA/LAMA  3.  Anemia No active bleeding Will monitor hemoglobin Will transfuse if hemoglobin drops to 7  4.  Mood disorder continue mirtazapine  5.  BPH continue Flomax  6.  Severe protein calorie malnutrition Continue supplemental feeding  Code Status: DNR  Severity of Illness: The appropriate patient status for this patient is INPATIENT. Inpatient status is judged to be reasonable and necessary in order to provide the required intensity of service to ensure the patient's safety. The patient's presenting symptoms, physical exam findings, and initial radiographic and laboratory data in the context of their chronic comorbidities is felt to place them at  high risk for further clinical deterioration. Furthermore, it is not anticipated that the patient will be medically stable for discharge from the hospital within 2 midnights of admission. The following factors support the patient status of inpatient.   " Has a chest tube that is being monitored   * I certify that at the point of admission it is my clinical judgment that the patient will require inpatient hospital care spanning beyond 2 midnights from the point of admission due to high intensity of service, high risk for further deterioration and high frequency of surveillance required.*   Family Communication: None at bedside  Disposition Plan: To be determined.  Physical therapy is recommending SNF   consultants: Cardiothoracic Palliative care  Procedures: Thoracentesis  Antimicrobials: None  DVT prophylaxis: Lovenox   Objective: Vitals:   03/27/21 0926 03/27/21 0927 03/27/21 1100 03/27/21 1506  BP:   (!) 111/58 133/66  Pulse:   69 74  Resp:   18 19  Temp:   98.3 F (36.8 C) 98.6 F (37 C)  TempSrc:   Oral Oral  SpO2: 98% 100% 99% 99%  Weight:      Height:        Intake/Output Summary (Last 24 hours) at 03/27/2021 1959 Last data filed at 03/27/2021 1759 Gross per 24 hour  Intake 360 ml  Output 440 ml  Net -80 ml    Filed Weights   04/09/2021 1610 04/04/2021 2331 03/21/21 0400  Weight: 52.2 kg 43.5 kg 45.9 kg   Body mass index is 15.85 kg/m.  Exam:  General: 75 y.o. year-old male well developed well nourished in no acute distress.  Alert and oriented x3.  Chronically ill  looking Cardiovascular: Regular rate and rhythm with no rubs or gallops.  No thyromegaly or JVD noted.   Respiratory: Clear to auscultation with no wheezes or rales. Good inspiratory effort. Abdomen: Soft nontender nondistended with normal bowel sounds x4 quadrants. Musculoskeletal: No lower extremity edema. 2/4 pulses in all 4 extremities. Skin: No ulcerative lesions noted or  rashes, Psychiatry: Mood is appropriate for condition and setting    Data Reviewed: CBC: Recent Labs  Lab 03/21/21 0432 03/22/21 0508 03/23/21 0057  WBC 12.5* 10.6* 10.2  HGB 7.8* 8.5* 8.2*  HCT 25.6* 27.3* 25.5*  MCV 99.6 98.6 95.9  PLT 282 286 458    Basic Metabolic Panel: Recent Labs  Lab 03/21/21 0432 03/22/21 0508 03/23/21 0057  NA 140 139 138  K 3.9 3.6 3.4*  CL 106 103 102  CO2 30 30 33*  GLUCOSE 90 95 111*  BUN 24* 25* 15  CREATININE 0.84 0.80 0.92  CALCIUM 8.0* 8.5* 8.1*  PHOS 2.5  --  2.3*    GFR: Estimated Creatinine Clearance: 44.3 mL/min (by C-G formula based on SCr of 0.92 mg/dL). Liver Function Tests: Recent Labs  Lab 03/21/21 0432 03/23/21 0057  ALBUMIN 2.6* 2.4*    No results for input(s): LIPASE, AMYLASE in the last 168 hours. No results for input(s): AMMONIA in the last 168 hours. Coagulation Profile: No results for input(s): INR, PROTIME in the last 168 hours.  Cardiac Enzymes: No results for input(s): CKTOTAL, CKMB, CKMBINDEX, TROPONINI in the last 168 hours. BNP (last 3 results) No results for input(s): PROBNP in the last 8760 hours. HbA1C: No results for input(s): HGBA1C in the last 72 hours. CBG: No results for input(s): GLUCAP in the last 168 hours. Lipid Profile: No results for input(s): CHOL, HDL, LDLCALC, TRIG, CHOLHDL, LDLDIRECT in the last 72 hours. Thyroid Function Tests: No results for input(s): TSH, T4TOTAL, FREET4, T3FREE, THYROIDAB in the last 72 hours. Anemia Panel: No results for input(s): VITAMINB12, FOLATE, FERRITIN, TIBC, IRON, RETICCTPCT in the last 72 hours. Urine analysis:    Component Value Date/Time   COLORURINE YELLOW 03/26/2021 2345   APPEARANCEUR HAZY (A) 03/18/2021 2345   LABSPEC 1.018 03/30/2021 2345   PHURINE 6.0 03/27/2021 2345   GLUCOSEU 50 (A) 04/06/2021 2345   HGBUR NEGATIVE 03/12/2021 2345   BILIRUBINUR NEGATIVE 04/03/2021 2345   KETONESUR 5 (A) 03/20/2021 2345   PROTEINUR 30 (A)  03/14/2021 2345   UROBILINOGEN 0.2 11/04/2014 1239   NITRITE NEGATIVE 03/30/2021 2345   LEUKOCYTESUR MODERATE (A) 04/10/2021 2345   Sepsis Labs: @LABRCNTIP (procalcitonin:4,lacticidven:4)  ) Recent Results (from the past 240 hour(s))  Resp Panel by RT-PCR (Flu A&B, Covid) Nasopharyngeal Swab     Status: None   Collection Time: 03/17/2021  3:53 PM   Specimen: Nasopharyngeal Swab; Nasopharyngeal(NP) swabs in vial transport medium  Result Value Ref Range Status   SARS Coronavirus 2 by RT PCR NEGATIVE NEGATIVE Final    Comment: (NOTE) SARS-CoV-2 target nucleic acids are NOT DETECTED.  The SARS-CoV-2 RNA is generally detectable in upper respiratory specimens during the acute phase of infection. The lowest concentration of SARS-CoV-2 viral copies this assay can detect is 138 copies/mL. A negative result does not preclude SARS-Cov-2 infection and should not be used as the sole basis for treatment or other patient management decisions. A negative result may occur with  improper specimen collection/handling, submission of specimen other than nasopharyngeal swab, presence of viral mutation(s) within the areas targeted by this assay, and inadequate number of viral copies(<138 copies/mL). A  negative result must be combined with clinical observations, patient history, and epidemiological information. The expected result is Negative.  Fact Sheet for Patients:  EntrepreneurPulse.com.au  Fact Sheet for Healthcare Providers:  IncredibleEmployment.be  This test is no t yet approved or cleared by the [place] FDA and  has been authorized for detection and/or diagnosis of SARS-CoV-2 by FDA under an Emergency Use Authorization (EUA). This EUA will remain  in effect (meaning this test can be used) for the duration of the COVID-19 declaration under Section 564(b)(1) of the Act, 21 U.S.C.section 360bbb-3(b)(1), unless the authorization is terminated  or revoked  sooner.       Influenza A by PCR NEGATIVE NEGATIVE Final   Influenza B by PCR NEGATIVE NEGATIVE Final    Comment: (NOTE) The Xpert Xpress SARS-CoV-2/FLU/RSV plus assay is intended as an aid in the diagnosis of influenza from Nasopharyngeal swab specimens and should not be used as a sole basis for treatment. Nasal washings and aspirates are unacceptable for Xpert Xpress SARS-CoV-2/FLU/RSV testing.  Fact Sheet for Patients: EntrepreneurPulse.com.au  Fact Sheet for Healthcare Providers: IncredibleEmployment.be  This test is not yet approved or cleared by the [place] FDA and has been authorized for detection and/or diagnosis of SARS-CoV-2 by FDA under an Emergency Use Authorization (EUA). This EUA will remain in effect (meaning this test can be used) for the duration of the COVID-19 declaration under Section 564(b)(1) of the Act, 21 U.S.C. section 360bbb-3(b)(1), unless the authorization is terminated or revoked.  Performed at [facility], [address], [place], [place] [address]   Blood Culture (routine x 2)     Status: None   Collection Time: 04/01/2021  3:59 PM   Specimen: BLOOD RIGHT ARM  Result Value Ref Range Status   Specimen Description   Final    BLOOD RIGHT ARM BOTTLES DRAWN AEROBIC AND ANAEROBIC   Special Requests Blood Culture adequate volume  Final   Culture   Final    NO GROWTH 5 DAYS Performed at [facility], [address], [place],  [place] [address]    Report Status 03/24/2021 FINAL  Final  Blood Culture (routine x 2)     Status: None   Collection Time: 03/31/2021  4:15 PM   Specimen: Right Antecubital; Blood  Result Value Ref Range Status   Specimen Description   Final    RIGHT ANTECUBITAL BOTTLES DRAWN AEROBIC AND ANAEROBIC   Special Requests Blood Culture adequate volume  Final   Culture   Final    NO GROWTH 5 DAYS Performed at [facility], [address], [place], [place] [address]    Report Status 03/24/2021  FINAL  Final  MRSA Next Gen by PCR, Nasal     Status: None   Collection Time: 03/16/2021  4:29 PM   Specimen: Nasal Mucosa; Nasal Swab  Result Value Ref Range Status   MRSA by PCR Next Gen NOT DETECTED NOT DETECTED Final    Comment: (NOTE) The GeneXpert MRSA Assay (FDA approved for NASAL specimens only), is one component of a comprehensive MRSA colonization surveillance program. It is not intended to diagnose MRSA infection nor to guide or monitor treatment for MRSA infections. Test performance is not FDA approved in patients less than 16 years old. Performed at [facility], [address], [place], [place] [address]   Urine culture     Status: None   Collection Time: 03/18/2021 11:45 PM   Specimen: In/Out Cath Urine  Result Value Ref Range Status   Specimen Description  Final    IN/OUT CATH URINE Performed at [facility], [address] [place], [place] [address]    Special Requests   Final    NONE Performed at [facility], [address] [place], [place] [address]    Culture   Final    NO GROWTH Performed at [facility], [place] [address], [place], [place] [address]    Report Status 03/21/2021 FINAL  Final      Studies: DG CHEST PORT 1 VIEW  Result Date: 03/27/2021 CLINICAL DATA:  Pneumothorax EXAM: PORTABLE CHEST 1 VIEW COMPARISON:  None. FINDINGS: Normal cardiac silhouette.  Lungs are hyperinflated. Small bore RIGHT chest tube unchanged in position. Trace RIGHT lateral pneumothorax noted measuring 7 mm from the lateral chest wall. Improvement in subcutaneous gas seen on comparison exam. IMPRESSION: 1. Trace RIGHT pneumothorax persists. 2. RIGHT chest tube in place. 3. Improvement in subcutaneous gas from comparison exam. 4. Hyperinflated lungs. Electronically Signed   By: [name] M.D.   On: 03/27/2021 08:15    Scheduled Meds:  chlorhexidine  15 mL Mouth Rinse BID   enoxaparin (LOVENOX) injection  30 mg Subcutaneous Q24H   feeding supplement  237 mL Oral QID    mouth rinse  15 mL Mouth Rinse q12n4p   mirtazapine  7.5 mg Oral QHS   mometasone-formoterol  2 puff Inhalation BID   tamsulosin  0.4 mg Oral Daily   umeclidinium bromide  1 puff Inhalation Daily    Continuous Infusions:   LOS: 8 days     Cristal Deer, MD [facility]  To reach me or the doctor on call, go to: [email or web address] Password [email or web address]  03/27/2021, 7:59 PM

## 2021-03-27 NOTE — Progress Notes (Addendum)
      [address]       [place] [address]             [phone]         Subjective:  More alert and talkative today  Objective: Vital signs in last 24 hours: Temp:  [97.8 F (36.6 C)-98.4 F (36.9 C)] 97.8 F (36.6 C) (07/17 0700) Pulse Rate:  [69-88] 69 (07/17 0700) Cardiac Rhythm: Normal sinus rhythm (07/17 0751) Resp:  [16-22] 16 (07/17 0700) BP: (119-150)/(58-83) 129/64 (07/17 0700) SpO2:  [96 %-100 %] 100 % (07/17 0927)  Hemodynamic parameters for last 24 hours:    Intake/Output from previous day: 07/16 0701 - 07/17 0700 In: 480 [P.O.:480] Out: 530 [Urine:450; Chest Tube:80] Intake/Output this shift: Total I/O In: 360 [P.O.:360] Out: -   General appearance: alert, cooperative, and no distress Heart: regular rate and rhythm Lungs: fair air exchange throughout  Lab Results: No results for input(s): WBC, HGB, HCT, PLT in the last 72 hours. BMET: No results for input(s): NA, K, CL, CO2, GLUCOSE, BUN, CREATININE, CALCIUM in the last 72 hours.  PT/INR: No results for input(s): LABPROT, INR in the last 72 hours. ABG    Component Value Date/Time   PHART 7.220 (L) 03/25/2021 1633   HCO3 26.7 04/01/2021 1633   TCO2 25.7 10/30/2014 0500   O2SAT 91.3 03/25/2021 1633   CBG (last 3)  No results for input(s): GLUCAP in the last 72 hours.  Meds Scheduled Meds:  chlorhexidine  15 mL Mouth Rinse BID   enoxaparin (LOVENOX) injection  30 mg Subcutaneous Q24H   feeding supplement  237 mL Oral QID   mouth rinse  15 mL Mouth Rinse q12n4p   mirtazapine  7.5 mg Oral QHS   mometasone-formoterol  2 puff Inhalation BID   tamsulosin  0.4 mg Oral Daily   umeclidinium bromide  1 puff Inhalation Daily   Continuous Infusions: PRN Meds:.acetaminophen **OR** acetaminophen, ipratropium-albuterol, morphine injection  Xrays DG CHEST PORT 1 VIEW  Result Date: 03/27/2021 CLINICAL DATA:  Pneumothorax EXAM: PORTABLE CHEST 1 VIEW COMPARISON:  None. FINDINGS:  Normal cardiac silhouette.  Lungs are hyperinflated. Small bore RIGHT chest tube unchanged in position. Trace RIGHT lateral pneumothorax noted measuring 7 mm from the lateral chest wall. Improvement in subcutaneous gas seen on comparison exam. IMPRESSION: 1. Trace RIGHT pneumothorax persists. 2. RIGHT chest tube in place. 3. Improvement in subcutaneous gas from comparison exam. 4. Hyperinflated lungs. Electronically Signed   By: [name] M.D.   On: 03/27/2021 08:15    Assessment/Plan:  1 afeb, VSS 2 sats good on 3 liters 3 CT only 80 cc/24 h 4 CXR trace apical right pntx, sub q gas improved- will place to 20 cm H2o suction- tiny air leak   LOS: 8 days    Alan Huff 03/27/2021    Agree with above Decreasing suction.  Hopefully, leak will seal  Manasa Spease O Josiel Gahm

## 2021-03-28 ENCOUNTER — Inpatient Hospital Stay (HOSPITAL_COMMUNITY): Payer: No Typology Code available for payment source

## 2021-03-28 DIAGNOSIS — J939 Pneumothorax, unspecified: Secondary | ICD-10-CM | POA: Diagnosis not present

## 2021-03-28 DIAGNOSIS — J439 Emphysema, unspecified: Secondary | ICD-10-CM | POA: Diagnosis not present

## 2021-03-28 DIAGNOSIS — J9611 Chronic respiratory failure with hypoxia: Secondary | ICD-10-CM | POA: Diagnosis not present

## 2021-03-28 DIAGNOSIS — E43 Unspecified severe protein-calorie malnutrition: Secondary | ICD-10-CM | POA: Diagnosis not present

## 2021-03-28 NOTE — Evaluation (Signed)
Occupational Therapy Evaluation Patient Details Name: Alan Huff MRN: [id number] DOB: [birth year] Today's Date: 03/28/2021    History of Present Illness Pt is a 75 y.o. M who presents with large right sided PTX and had chest tube placed. Significant PMH: COPD on 2-4L, prior history of PTX, cognitive impairment.   Clinical Impression   This 75 yo male admitted with above presents to acute OT with PLOF of being able to feed himself pta but needed A will all other basic ADLs. Currently he can self feed once food/plate set up for him, he can wash his face once washcloth presented. No further OT needs, pt is back to his baseline level of functioning from an ADL standpoint.Acute OT will sign off.    Follow Up Recommendations  No OT follow up;Supervision/Assistance - 24 hour    Equipment Recommendations  None recommended by OT       Precautions / Restrictions Precautions Precautions: Fall;Other (comment) Precaution Comments: chest tube, low vision (legally blind), very HOH Restrictions Weight Bearing Restrictions: No             ADL either performed or assessed with clinical judgement   ADL Overall ADL's : Needs assistance/impaired Eating/Feeding: Set up;Bed level   Grooming: Wash/dry face;Set up;Bed level;Supervision/safety   Upper Body Bathing: Set up;Supervision/ safety;Bed level   Lower Body Bathing: Total assistance;Bed level   Upper Body Dressing : Maximal assistance;Bed level   Lower Body Dressing: Total assistance;Bed level                       Vision Baseline Vision/History: Legally blind Additional Comments: was able to reach and pick up chips as well as cookie off of his plate in front of him as well as reach and touch the ensure I asked him to find. When asked to find the red button on the call bell he said he could not see it, but he could feel to find it.            Pertinent Vitals/Pain Pain Assessment: No/denies pain     Hand Dominance  Right   Extremity/Trunk Assessment Upper Extremity Assessment Upper Extremity Assessment: Overall WFL for tasks assessed           Communication Communication Communication: HOH   Cognition Arousal/Alertness: Awake/alert Behavior During Therapy: Flat affect Overall Cognitive Status: History of cognitive impairments - at baseline                                 General Comments: Pt very HOH, able to follow 1 step commands, reports he was bathing/dressing and walking all by himself as well as living at home pta (poor historian)              Home Living Family/patient expects to be discharged to:: Skilled nursing facility                                 Additional Comments: (custodial care at [facility])      Prior Functioning/Environment Level of Independence: Needs assistance  Gait / Transfers Assistance Needed: has not stood in 1.5 years ADL's / Homemaking Assistance Needed: assist for ADL's, pt reports ability to self feed            OT Problem List: Impaired vision/perception;Decreased cognition         OT  Goals(Current goals can be found in the care plan section) Acute Rehab OT Goals Patient Stated Goal: did not state                AM-PAC OT "6 Clicks" Daily Activity     Outcome Measure Help from another person eating meals?: A Little (setup) Help from another person taking care of personal grooming?: A Little (setuup/S) Help from another person toileting, which includes using toliet, bedpan, or urinal?: Total Help from another person bathing (including washing, rinsing, drying)?: A Lot Help from another person to put on and taking off regular upper body clothing?: Total Help from another person to put on and taking off regular lower body clothing?: Total 6 Click Score: 11   End of Session    Activity Tolerance: Patient tolerated treatment well Patient left: in bed;with call bell/phone within reach;with bed alarm  set  OT Visit Diagnosis: Other abnormalities of gait and mobility (R26.89);Other symptoms and signs involving cognitive function                Time: 9163-8466 OT Time Calculation (min): 18 min Charges:  OT General Charges $OT Visit: 1 Visit OT Evaluation $OT Eval Low Complexity: 1 Low $OT Eval Moderate Complexity: 1 Mod [place], OTR/L Acute [employer] Pager [phone] Office [phone]    Almon Register 03/28/2021, 4:01 PM

## 2021-03-28 NOTE — Progress Notes (Addendum)
      [address]       [place] [address]             [phone]         Subjective: Feels okay this morning, no complaints  Objective: Vital signs in last 24 hours: Temp:  [97.9 F (36.6 C)-98.6 F (37 C)] 98.1 F (36.7 C) (07/18 0307) Pulse Rate:  [69-87] 83 (07/18 0307) Cardiac Rhythm: Heart block (07/18 0723) Resp:  [18-22] 20 (07/18 0307) BP: (96-148)/(51-99) 112/57 (07/18 0307) SpO2:  [94 %-100 %] 98 % (07/18 0307)    Intake/Output from previous day: 07/17 0701 - 07/18 0700 In: 360 [P.O.:360] Out: 440 [Urine:400; Chest Tube:40] Intake/Output this shift: No intake/output data recorded.  General appearance: alert, cooperative, and no distress Heart: regular rate and rhythm, S1, S2 normal, no murmur, click, rub or gallop Lungs: clear to auscultation bilaterally Abdomen: soft, non-tender; bowel sounds normal; no masses,  no organomegaly Extremities: extremities normal, atraumatic, no cyanosis or edema Wound: clean and dry around the chest tube  Lab Results: No results for input(s): WBC, HGB, HCT, PLT in the last 72 hours. BMET: No results for input(s): NA, K, CL, CO2, GLUCOSE, BUN, CREATININE, CALCIUM in the last 72 hours.  PT/INR: No results for input(s): LABPROT, INR in the last 72 hours. ABG    Component Value Date/Time   PHART 7.220 (L) 04/10/2021 1633   HCO3 26.7 04/09/2021 1633   TCO2 25.7 10/30/2014 0500   O2SAT 91.3 03/18/2021 1633   CBG (last 3)  No results for input(s): GLUCAP in the last 72 hours.  Assessment/Plan: S/P chest tube insertion with on-going air leak  1 afeb, VSS 2 sats good on 3 liters 3 CT only 40 cc/24 hrs 4 CXR unable to be viewed this morning due to technical issues. He remains on 20cm of suction with tiny air leak.    LOS: 9 days    Elgie Collard 03/28/2021  Agree with above  Lajuana Matte

## 2021-03-28 NOTE — Progress Notes (Signed)
PROGRESS NOTE  Alan Huff CHY:[id number] DOB: May 03, 1946 DOA: 03/26/2021 PCP: [facility]  HPI/Recap of past 24 hours: This is a 75 year old male with COPD, chronic respiratory failure on 3 L/min to 4 L/min home O2 history of pneumothorax, history of lung blebs, bronchial alveolar fistula status post pleurodesis.  He presented with respiratory distress and chest discomfort chest x-ray in the ER showed right chest pneumothorax.  Chest tube was inserted and his symptom has improved.  Assessment/Plan: Active Problems:   Lung blebs (HCC)   Persistent cognitive impairment   Pneumothorax, right   COPD (chronic obstructive pulmonary disease) (HCC)   Chronic respiratory failure with hypoxia (HCC)   Protein-calorie malnutrition, severe  Acute on chronic hypoxic respiratory failure due to pneumothorax Right pneumothorax - recurrent given history as above Chest tube in place and seal is good cardiothoracic surgery is following Palliative consult following  COPD.  Stable Continue inhalers ICS/LABA/LAMA  Chronic anemia of chronic disease No active bleeding Will monitor hemoglobin Will transfuse if hemoglobin drops to 7  Unspecified mood disorder continue mirtazapine  BPH continue Flomax  Severe protein calorie malnutrition Continue supplemental feeding  Code Status: DNR Family Communication: None at bedside Disposition Plan: To be determined.  Physical therapy is recommending return to SNF   consultants: Cardiothoracic Palliative care  Procedures: Thoracentesis  Antimicrobials: None  DVT prophylaxis: Lovenox   Objective: Vitals:   03/27/21 2106 03/27/21 2129 03/27/21 2306 03/28/21 0307  BP: (!) 148/99  (!) 96/51 (!) 112/57  Pulse: 87  84 83  Resp: 18  20 20   Temp:   97.9 F (36.6 C) 98.1 F (36.7 C)  TempSrc:    Oral  SpO2: 100% 94% 99% 98%  Weight:      Height:        Intake/Output Summary (Last 24 hours) at 03/28/2021 0805 Last data filed at  03/28/2021 0700 Gross per 24 hour  Intake 360 ml  Output 440 ml  Net -80 ml    Filed Weights   04/04/2021 1610 03/18/2021 2331 03/21/21 0400  Weight: 52.2 kg 43.5 kg 45.9 kg   Body mass index is 15.85 kg/m.  Exam:  General:  Pleasantly resting in bed, No acute distress. HEENT:  Normocephalic atraumatic.  Sclerae nonicteric, noninjected.  Extraocular movements intact bilaterally. Neck:  Without mass or deformity.  Trachea is midline. Lungs:  Clear to auscultate bilaterally without rhonchi, wheeze, or rales.  Left chest tube with continued air leak Heart:  Regular rate and rhythm.  Without murmurs, rubs, or gallops. Abdomen:  Soft, nontender, nondistended.  Without guarding or rebound. Extremities: Without cyanosis, clubbing, edema, or obvious deformity. Vascular:  Dorsalis pedis and posterior tibial pulses palpable bilaterally. Skin:  Warm and dry, no erythema, no ulcerations.    Data Reviewed: CBC: Recent Labs  Lab 03/22/21 0508 03/23/21 0057  WBC 10.6* 10.2  HGB 8.5* 8.2*  HCT 27.3* 25.5*  MCV 98.6 95.9  PLT 286 878    Basic Metabolic Panel: Recent Labs  Lab 03/22/21 0508 03/23/21 0057  NA 139 138  K 3.6 3.4*  CL 103 102  CO2 30 33*  GLUCOSE 95 111*  BUN 25* 15  CREATININE 0.80 0.92  CALCIUM 8.5* 8.1*  PHOS  --  2.3*    GFR: Estimated Creatinine Clearance: 44.3 mL/min (by C-G formula based on SCr of 0.92 mg/dL). Liver Function Tests: Recent Labs  Lab 03/23/21 0057  ALBUMIN 2.4*    No results for input(s): LIPASE, AMYLASE in the last 168 hours.  No results for input(s): AMMONIA in the last 168 hours. Coagulation Profile: No results for input(s): INR, PROTIME in the last 168 hours.  Cardiac Enzymes: No results for input(s): CKTOTAL, CKMB, CKMBINDEX, TROPONINI in the last 168 hours. BNP (last 3 results) No results for input(s): PROBNP in the last 8760 hours. HbA1C: No results for input(s): HGBA1C in the last 72 hours. CBG: No results for input(s):  GLUCAP in the last 168 hours. Lipid Profile: No results for input(s): CHOL, HDL, LDLCALC, TRIG, CHOLHDL, LDLDIRECT in the last 72 hours. Thyroid Function Tests: No results for input(s): TSH, T4TOTAL, FREET4, T3FREE, THYROIDAB in the last 72 hours. Anemia Panel: No results for input(s): VITAMINB12, FOLATE, FERRITIN, TIBC, IRON, RETICCTPCT in the last 72 hours. Urine analysis:    Component Value Date/Time   COLORURINE YELLOW 04/06/2021 2345   APPEARANCEUR HAZY (A) 04/03/2021 2345   LABSPEC 1.018 03/30/2021 2345   PHURINE 6.0 03/25/2021 2345   GLUCOSEU 50 (A) 04/09/2021 2345   HGBUR NEGATIVE 03/30/2021 2345   BILIRUBINUR NEGATIVE 04/09/2021 2345   KETONESUR 5 (A) 04/07/2021 2345   PROTEINUR 30 (A) 03/14/2021 2345   UROBILINOGEN 0.2 11/04/2014 1239   NITRITE NEGATIVE 03/29/2021 2345   LEUKOCYTESUR MODERATE (A) 04/09/2021 2345   Sepsis Labs: @LABRCNTIP (procalcitonin:4,lacticidven:4)  ) Recent Results (from the past 240 hour(s))  Resp Panel by RT-PCR (Flu A&B, Covid) Nasopharyngeal Swab     Status: None   Collection Time: 04/01/2021  3:53 PM   Specimen: Nasopharyngeal Swab; Nasopharyngeal(NP) swabs in vial transport medium  Result Value Ref Range Status   SARS Coronavirus 2 by RT PCR NEGATIVE NEGATIVE Final    Comment: (NOTE) SARS-CoV-2 target nucleic acids are NOT DETECTED.  The SARS-CoV-2 RNA is generally detectable in upper respiratory specimens during the acute phase of infection. The lowest concentration of SARS-CoV-2 viral copies this assay can detect is 138 copies/mL. A negative result does not preclude SARS-Cov-2 infection and should not be used as the sole basis for treatment or other patient management decisions. A negative result may occur with  improper specimen collection/handling, submission of specimen other than nasopharyngeal swab, presence of viral mutation(s) within the areas targeted by this assay, and inadequate number of viral copies(<138 copies/mL). A  negative result must be combined with clinical observations, patient history, and epidemiological information. The expected result is Negative.  Fact Sheet for Patients:  EntrepreneurPulse.com.au  Fact Sheet for Healthcare Providers:  IncredibleEmployment.be  This test is no t yet approved or cleared by the [place] FDA and  has been authorized for detection and/or diagnosis of SARS-CoV-2 by FDA under an Emergency Use Authorization (EUA). This EUA will remain  in effect (meaning this test can be used) for the duration of the COVID-19 declaration under Section 564(b)(1) of the Act, 21 U.S.C.section 360bbb-3(b)(1), unless the authorization is terminated  or revoked sooner.       Influenza A by PCR NEGATIVE NEGATIVE Final   Influenza B by PCR NEGATIVE NEGATIVE Final    Comment: (NOTE) The Xpert Xpress SARS-CoV-2/FLU/RSV plus assay is intended as an aid in the diagnosis of influenza from Nasopharyngeal swab specimens and should not be used as a sole basis for treatment. Nasal washings and aspirates are unacceptable for Xpert Xpress SARS-CoV-2/FLU/RSV testing.  Fact Sheet for Patients: EntrepreneurPulse.com.au  Fact Sheet for Healthcare Providers: IncredibleEmployment.be  This test is not yet approved or cleared by the [place] FDA and has been authorized for detection and/or diagnosis of SARS-CoV-2 by FDA under an Emergency Use Authorization (  EUA). This EUA will remain in effect (meaning this test can be used) for the duration of the COVID-19 declaration under Section 564(b)(1) of the Act, 21 U.S.C. section 360bbb-3(b)(1), unless the authorization is terminated or revoked.  Performed at [facility], [address] [place], [place] [address]   Blood Culture (routine x 2)     Status: None   Collection Time: 03/25/2021  3:59 PM   Specimen: BLOOD RIGHT ARM  Result Value Ref Range Status   Specimen  Description   Final    BLOOD RIGHT ARM BOTTLES DRAWN AEROBIC AND ANAEROBIC   Special Requests Blood Culture adequate volume  Final   Culture   Final    NO GROWTH 5 DAYS Performed at [facility], [address], [place], [place] [address]    Report Status 03/24/2021 FINAL  Final  Blood Culture (routine x 2)     Status: None   Collection Time: 04/06/2021  4:15 PM   Specimen: Right Antecubital; Blood  Result Value Ref Range Status   Specimen Description   Final    RIGHT ANTECUBITAL BOTTLES DRAWN AEROBIC AND ANAEROBIC   Special Requests Blood Culture adequate volume  Final   Culture   Final    NO GROWTH 5 DAYS Performed at [facility], [address], [place], [place] [address]    Report Status 03/24/2021 FINAL  Final  MRSA Next Gen by PCR, Nasal     Status: None   Collection Time: 03/22/2021  4:29 PM   Specimen: Nasal Mucosa; Nasal Swab  Result Value Ref Range Status   MRSA by PCR Next Gen NOT DETECTED NOT DETECTED Final    Comment: (NOTE) The GeneXpert MRSA Assay (FDA approved for NASAL specimens only), is one component of a comprehensive MRSA colonization surveillance program. It is not intended to diagnose MRSA infection nor to guide or monitor treatment for MRSA infections. Test performance is not FDA approved in patients less than 75 years old. Performed at [facility], [address], [place], [place] [address]   Urine culture     Status: None   Collection Time: 03/23/2021 11:45 PM   Specimen: In/Out Cath Urine  Result Value Ref Range Status   Specimen Description   Final    IN/OUT CATH URINE Performed at [facility], [address], [place], [place] [address]    Special Requests   Final    NONE Performed at [facility], [address], [place], [place] [address]    Culture   Final    NO GROWTH Performed at [facility], [place] [address] [place],  [address]    Report Status 03/21/2021 FINAL  Final      Studies: No results found.  Scheduled Meds:   chlorhexidine  15 mL Mouth Rinse BID   enoxaparin (LOVENOX) injection  30 mg Subcutaneous Q24H   feeding supplement  237 mL Oral QID   mouth rinse  15 mL Mouth Rinse q12n4p   mirtazapine  7.5 mg Oral QHS   mometasone-formoterol  2 puff Inhalation BID   tamsulosin  0.4 mg Oral Daily   umeclidinium bromide  1 puff Inhalation Daily    Continuous Infusions:   LOS: 9 days   Little Ishikawa, DO [facility]  03/28/2021, 8:05 AM

## 2021-03-29 ENCOUNTER — Inpatient Hospital Stay (HOSPITAL_COMMUNITY): Payer: No Typology Code available for payment source

## 2021-03-29 DIAGNOSIS — J9611 Chronic respiratory failure with hypoxia: Secondary | ICD-10-CM | POA: Diagnosis not present

## 2021-03-29 DIAGNOSIS — E43 Unspecified severe protein-calorie malnutrition: Secondary | ICD-10-CM | POA: Diagnosis not present

## 2021-03-29 DIAGNOSIS — J939 Pneumothorax, unspecified: Secondary | ICD-10-CM | POA: Diagnosis not present

## 2021-03-29 DIAGNOSIS — J439 Emphysema, unspecified: Secondary | ICD-10-CM | POA: Diagnosis not present

## 2021-03-29 LAB — BASIC METABOLIC PANEL
Anion gap: 6 (ref 5–15)
BUN: 42 mg/dL — ABNORMAL HIGH (ref 8–23)
CO2: 34 mmol/L — ABNORMAL HIGH (ref 22–32)
Calcium: 8.5 mg/dL — ABNORMAL LOW (ref 8.9–10.3)
Chloride: 100 mmol/L (ref 98–111)
Creatinine, Ser: 0.84 mg/dL (ref 0.61–1.24)
GFR, Estimated: >60 mL/min (ref >60)
Glucose, Bld: 115 mg/dL — ABNORMAL HIGH (ref 70–99)
Potassium: 4.4 mmol/L (ref 3.5–5.1)
Sodium: 140 mmol/L (ref 135–145)

## 2021-03-29 LAB — CBC
HCT: 28.8 % — ABNORMAL LOW (ref 39.0–52.0)
Hemoglobin: 8.9 g/dL — ABNORMAL LOW (ref 13.0–17.0)
MCH: 30.5 pg (ref 26.0–34.0)
MCHC: 30.9 g/dL (ref 30.0–36.0)
MCV: 98.6 fL (ref 80.0–100.0)
Platelets: 421 10*3/uL — ABNORMAL HIGH (ref 150–400)
RBC: 2.92 MIL/uL — ABNORMAL LOW (ref 4.22–5.81)
RDW: 15.4 % (ref 11.5–15.5)
WBC: 10.2 10*3/uL (ref 4.0–10.5)
nRBC: 0 % (ref 0.0–0.2)

## 2021-03-29 NOTE — Progress Notes (Signed)
PROGRESS NOTE  Alan Huff BHA:[id number] DOB: [birth year] DOA: 04/01/2021 PCP: [facility]  HPI/Recap of past 24 hours: This is a 75 year old male with COPD, chronic respiratory failure on 3 L/min to 4 L/min home O2 history of pneumothorax, history of lung blebs, bronchial alveolar fistula status post pleurodesis.  He presented with respiratory distress and chest discomfort chest x-ray in the ER showed right chest pneumothorax.  Chest tube was inserted and his symptom has improved.  Assessment/Plan: Active Problems:   Lung blebs (HCC)   Persistent cognitive impairment   Pneumothorax, right   COPD (chronic obstructive pulmonary disease) (HCC)   Chronic respiratory failure with hypoxia (HCC)   Protein-calorie malnutrition, severe  Acute on chronic hypoxic respiratory failure due to pneumothorax Right pneumothorax - recurrent given history as above Chest tube in place and seal is good cardiothoracic surgery is following Palliative consult following  COPD.  Stable Continue inhalers ICS/LABA/LAMA  Chronic anemia of chronic disease No active bleeding Will monitor hemoglobin Will transfuse if hemoglobin drops to 7  Unspecified mood disorder continue mirtazapine  BPH continue Flomax  Severe protein calorie malnutrition Continue supplemental feeding  Code Status: DNR Family Communication: None at bedside Disposition Plan: To be determined.  Physical therapy is recommending return to SNF   consultants: Cardiothoracic Palliative care  Procedures: Thoracentesis  Antimicrobials: None  DVT prophylaxis: Lovenox   Objective: Vitals:   03/28/21 2017 03/28/21 2313 03/29/21 0000 03/29/21 0300  BP:  103/75  130/76  Pulse:  89 79 74  Resp:  20 14 12   Temp:  97.7 F (36.5 C)  97.8 F (36.6 C)  TempSrc:  Oral  Oral  SpO2: 96% 100% 100% 100%  Weight:      Height:        Intake/Output Summary (Last 24 hours) at 03/29/2021 0736 Last data filed at 03/29/2021  0700 Gross per 24 hour  Intake 2040 ml  Output 1415 ml  Net 625 ml    Filed Weights   03/18/2021 1610 03/25/2021 2331 03/21/21 0400  Weight: 52.2 kg 43.5 kg 45.9 kg   Body mass index is 15.85 kg/m.  Exam:  General:  Pleasantly resting in bed, No acute distress. HEENT:  Normocephalic atraumatic.  Sclerae nonicteric, noninjected.  Extraocular movements intact bilaterally. Neck:  Without mass or deformity.  Trachea is midline. Lungs:  Clear to auscultate bilaterally without rhonchi, wheeze, or rales.  Left chest tube with continued air leak Heart:  Regular rate and rhythm.  Without murmurs, rubs, or gallops. Abdomen:  Soft, nontender, nondistended.  Without guarding or rebound. Extremities: Without cyanosis, clubbing, edema, or obvious deformity. Vascular:  Dorsalis pedis and posterior tibial pulses palpable bilaterally. Skin:  Warm and dry, no erythema, no ulcerations.  Data Reviewed: CBC: Recent Labs  Lab 03/23/21 0057 03/29/21 0547  WBC 10.2 10.2  HGB 8.2* 8.9*  HCT 25.5* 28.8*  MCV 95.9 98.6  PLT 301 421*    Basic Metabolic Panel: Recent Labs  Lab 03/23/21 0057 03/29/21 0547  NA 138 140  K 3.4* 4.4  CL 102 100  CO2 33* 34*  GLUCOSE 111* 115*  BUN 15 42*  CREATININE 0.92 0.84  CALCIUM 8.1* 8.5*  PHOS 2.3*  --     GFR: Estimated Creatinine Clearance: 48.6 mL/min (by C-G formula based on SCr of 0.84 mg/dL). Liver Function Tests: Recent Labs  Lab 03/23/21 0057  ALBUMIN 2.4*    No results for input(s): LIPASE, AMYLASE in the last 168 hours. No results for input(s): AMMONIA  in the last 168 hours. Coagulation Profile: No results for input(s): INR, PROTIME in the last 168 hours.  Cardiac Enzymes: No results for input(s): CKTOTAL, CKMB, CKMBINDEX, TROPONINI in the last 168 hours. BNP (last 3 results) No results for input(s): PROBNP in the last 8760 hours. HbA1C: No results for input(s): HGBA1C in the last 72 hours. CBG: No results for input(s): GLUCAP  in the last 168 hours. Lipid Profile: No results for input(s): CHOL, HDL, LDLCALC, TRIG, CHOLHDL, LDLDIRECT in the last 72 hours. Thyroid Function Tests: No results for input(s): TSH, T4TOTAL, FREET4, T3FREE, THYROIDAB in the last 72 hours. Anemia Panel: No results for input(s): VITAMINB12, FOLATE, FERRITIN, TIBC, IRON, RETICCTPCT in the last 72 hours. Urine analysis:    Component Value Date/Time   COLORURINE YELLOW 04/04/2021 2345   APPEARANCEUR HAZY (A) 04/01/2021 2345   LABSPEC 1.018 03/13/2021 2345   PHURINE 6.0 03/11/2021 2345   GLUCOSEU 50 (A) 03/28/2021 2345   HGBUR NEGATIVE 03/18/2021 2345   BILIRUBINUR NEGATIVE 04/08/2021 2345   KETONESUR 5 (A) 04/06/2021 2345   PROTEINUR 30 (A) 04/06/2021 2345   UROBILINOGEN 0.2 11/04/2014 1239   NITRITE NEGATIVE 04/01/2021 2345   LEUKOCYTESUR MODERATE (A) 03/22/2021 2345   Sepsis Labs: @LABRCNTIP (procalcitonin:4,lacticidven:4)  ) Recent Results (from the past 240 hour(s))  Resp Panel by RT-PCR (Flu A&B, Covid) Nasopharyngeal Swab     Status: None   Collection Time: 03/25/2021  3:53 PM   Specimen: Nasopharyngeal Swab; Nasopharyngeal(NP) swabs in vial transport medium  Result Value Ref Range Status   SARS Coronavirus 2 by RT PCR NEGATIVE NEGATIVE Final    Comment: (NOTE) SARS-CoV-2 target nucleic acids are NOT DETECTED.  The SARS-CoV-2 RNA is generally detectable in upper respiratory specimens during the acute phase of infection. The lowest concentration of SARS-CoV-2 viral copies this assay can detect is 138 copies/mL. A negative result does not preclude SARS-Cov-2 infection and should not be used as the sole basis for treatment or other patient management decisions. A negative result may occur with  improper specimen collection/handling, submission of specimen other than nasopharyngeal swab, presence of viral mutation(s) within the areas targeted by this assay, and inadequate number of viral copies(<138 copies/mL). A negative  result must be combined with clinical observations, patient history, and epidemiological information. The expected result is Negative.  Fact Sheet for Patients:  EntrepreneurPulse.com.au  Fact Sheet for Healthcare Providers:  IncredibleEmployment.be  This test is no t yet approved or cleared by the [place] FDA and  has been authorized for detection and/or diagnosis of SARS-CoV-2 by FDA under an Emergency Use Authorization (EUA). This EUA will remain  in effect (meaning this test can be used) for the duration of the COVID-19 declaration under Section 564(b)(1) of the Act, 21 U.S.C.section 360bbb-3(b)(1), unless the authorization is terminated  or revoked sooner.       Influenza A by PCR NEGATIVE NEGATIVE Final   Influenza B by PCR NEGATIVE NEGATIVE Final    Comment: (NOTE) The Xpert Xpress SARS-CoV-2/FLU/RSV plus assay is intended as an aid in the diagnosis of influenza from Nasopharyngeal swab specimens and should not be used as a sole basis for treatment. Nasal washings and aspirates are unacceptable for Xpert Xpress SARS-CoV-2/FLU/RSV testing.  Fact Sheet for Patients: EntrepreneurPulse.com.au  Fact Sheet for Healthcare Providers: IncredibleEmployment.be  This test is not yet approved or cleared by the [place] FDA and has been authorized for detection and/or diagnosis of SARS-CoV-2 by FDA under an Emergency Use Authorization (EUA). This EUA will remain  in effect (meaning this test can be used) for the duration of the COVID-19 declaration under Section 564(b)(1) of the Act, 21 U.S.C. section 360bbb-3(b)(1), unless the authorization is terminated or revoked.  Performed at [facility], [address], [place], [place] [address]   Blood Culture (routine x 2)     Status: None   Collection Time: 03/15/2021  3:59 PM   Specimen: BLOOD RIGHT ARM  Result Value Ref Range Status   Specimen  Description   Final    BLOOD RIGHT ARM BOTTLES DRAWN AEROBIC AND ANAEROBIC   Special Requests Blood Culture adequate volume  Final   Culture   Final    NO GROWTH 5 DAYS Performed at [facility], [address] [place], [place] [address]    Report Status 03/24/2021 FINAL  Final  Blood Culture (routine x 2)     Status: None   Collection Time: 04/09/2021  4:15 PM   Specimen: Right Antecubital; Blood  Result Value Ref Range Status   Specimen Description   Final    RIGHT ANTECUBITAL BOTTLES DRAWN AEROBIC AND ANAEROBIC   Special Requests Blood Culture adequate volume  Final   Culture   Final    NO GROWTH 5 DAYS Performed at [facility], [address], [place], [place] [address]    Report Status 03/24/2021 FINAL  Final  MRSA Next Gen by PCR, Nasal     Status: None   Collection Time: 04/07/2021  4:29 PM   Specimen: Nasal Mucosa; Nasal Swab  Result Value Ref Range Status   MRSA by PCR Next Gen NOT DETECTED NOT DETECTED Final    Comment: (NOTE) The GeneXpert MRSA Assay (FDA approved for NASAL specimens only), is one component of a comprehensive MRSA colonization surveillance program. It is not intended to diagnose MRSA infection nor to guide or monitor treatment for MRSA infections. Test performance is not FDA approved in patients less than 76 years old. Performed at [facility], [address], [place], [place] [address]   Urine culture     Status: None   Collection Time: 04/09/2021 11:45 PM   Specimen: In/Out Cath Urine  Result Value Ref Range Status   Specimen Description   Final    IN/OUT CATH URINE Performed at [facility], [address], [place], [place] [address]    Special Requests   Final    NONE Performed at [facility], [address] [place], [place] [address]    Culture   Final    NO GROWTH Performed at [facility], [place] [address], [place], [place] [address]    Report Status 03/21/2021 FINAL  Final      Studies: No results found.  Scheduled Meds:   chlorhexidine  15 mL Mouth Rinse BID   enoxaparin (LOVENOX) injection  30 mg Subcutaneous Q24H   feeding supplement  237 mL Oral QID   mouth rinse  15 mL Mouth Rinse q12n4p   mirtazapine  7.5 mg Oral QHS   mometasone-formoterol  2 puff Inhalation BID   tamsulosin  0.4 mg Oral Daily   umeclidinium bromide  1 puff Inhalation Daily     LOS: 10 days   Little Ishikawa, DO [facility]  03/29/2021, 7:36 AM

## 2021-03-29 NOTE — Progress Notes (Addendum)
      [address]       [place] [address]             [phone]         Subjective: Small air leak persists, unchanged  Objective: Vital signs in last 24 hours: Temp:  [97.7 F (36.5 C)-99.1 F (37.3 C)] 98.2 F (36.8 C) (07/19 0700) Pulse Rate:  [68-92] 74 (07/19 0300) Cardiac Rhythm: Normal sinus rhythm (07/19 0300) Resp:  [12-27] 12 (07/19 0300) BP: (103-130)/(59-76) 118/59 (07/19 0700) SpO2:  [96 %-100 %] 100 % (07/19 0300)  Hemodynamic parameters for last 24 hours:    Intake/Output from previous day: 07/18 0701 - 07/19 0700 In: 2040 [P.O.:2040] Out: 1415 [Urine:1375; Chest Tube:40] Intake/Output this shift: No intake/output data recorded.  General appearance: alert, cooperative, distracted, and no distress Heart: regular rate and rhythm Lungs: fair air exchange  Lab Results: Recent Labs    03/29/21 0547  WBC 10.2  HGB 8.9*  HCT 28.8*  PLT 421*   BMET:  Recent Labs    03/29/21 0547  NA 140  K 4.4  CL 100  CO2 34*  GLUCOSE 115*  BUN 42*  CREATININE 0.84  CALCIUM 8.5*    PT/INR: No results for input(s): LABPROT, INR in the last 72 hours. ABG    Component Value Date/Time   PHART 7.220 (L) 04/02/2021 1633   HCO3 26.7 03/29/2021 1633   TCO2 25.7 10/30/2014 0500   O2SAT 91.3 03/14/2021 1633   CBG (last 3)  No results for input(s): GLUCAP in the last 72 hours.  Meds Scheduled Meds:  chlorhexidine  15 mL Mouth Rinse BID   enoxaparin (LOVENOX) injection  30 mg Subcutaneous Q24H   feeding supplement  237 mL Oral QID   mouth rinse  15 mL Mouth Rinse q12n4p   mirtazapine  7.5 mg Oral QHS   mometasone-formoterol  2 puff Inhalation BID   tamsulosin  0.4 mg Oral Daily   umeclidinium bromide  1 puff Inhalation Daily   Continuous Infusions: PRN Meds:.acetaminophen **OR** acetaminophen, ipratropium-albuterol, morphine injection  Xrays DG CHEST PORT 1 VIEW  Result Date: 03/28/2021 CLINICAL DATA:  Right pneumothorax. EXAM:  PORTABLE CHEST 1 VIEW COMPARISON:  March 27, 2021. FINDINGS: The heart size and mediastinal contours are within normal limits. Right-sided chest tube is unchanged in position. Stable small right apical pneumothorax is noted. Stable bibasilar atelectasis or scarring is noted. The visualized skeletal structures are unremarkable. IMPRESSION: Stable position of right-sided chest tube. Stable small right apical pneumothorax. Electronically Signed   By: Marijo Conception M.D.   On: 03/28/2021 08:36    Assessment/Plan:  1 Tmax 99.1, VSS 2 sats ok  on 3 liters 3 CXR- small pntx 4 will reduce CT suction to 10 cm H2O, cont to wean over time, not a surgical candidate 5 medical management per primary   LOS: 10 days    John Giovanni PA-C Pager [phone] 03/29/2021  Agree with above Continue pulmonary hygiene Praise Stennett Bary Leriche

## 2021-03-30 ENCOUNTER — Inpatient Hospital Stay (HOSPITAL_COMMUNITY): Payer: No Typology Code available for payment source

## 2021-03-30 DIAGNOSIS — J939 Pneumothorax, unspecified: Secondary | ICD-10-CM | POA: Diagnosis not present

## 2021-03-30 DIAGNOSIS — J439 Emphysema, unspecified: Secondary | ICD-10-CM | POA: Diagnosis not present

## 2021-03-30 DIAGNOSIS — J9611 Chronic respiratory failure with hypoxia: Secondary | ICD-10-CM | POA: Diagnosis not present

## 2021-03-30 DIAGNOSIS — E43 Unspecified severe protein-calorie malnutrition: Secondary | ICD-10-CM | POA: Diagnosis not present

## 2021-03-30 NOTE — Progress Notes (Signed)
   03/30/21 1805  Assess: MEWS Score  Pulse Rate (!) 115  ECG Heart Rate (!) 113  Resp (!) 25  SpO2 (!) 86 %  O2 Device Nasal Cannula  O2 Flow Rate (L/min) 5 L/min  Assess: MEWS Score  MEWS Temp 0  MEWS Systolic 0  MEWS Pulse 2  MEWS RR 1  MEWS LOC 0  MEWS Score 3  MEWS Score Color Yellow  Assess: if the MEWS score is Yellow or Red  Were vital signs taken at a resting state? Yes  Focused Assessment Change from prior assessment (see assessment flowsheet)  Early Detection of Sepsis Score *See Row Information* Low  MEWS guidelines implemented *See Row Information* Yes  Treat  MEWS Interventions Administered prn meds/treatments;Escalated (See documentation below);Consulted Respiratory Therapy  Take Vital Signs  Increase Vital Sign Frequency  Yellow: Q 2hr X 2 then Q 4hr X 2, if remains yellow, continue Q 4hrs  Escalate  MEWS: Escalate Yellow: discuss with charge nurse/RN and consider discussing with provider and RRT  Notify: Charge Nurse/RN  Name of Charge Nurse/RN Notified [employer]  Date Charge Nurse/RN Notified 03/30/21  Time Charge Nurse/RN Notified 1805  Notify: Provider  Provider Name/Title Dr.Willam Avon Gully  Date Provider Notified 03/30/21  Time Provider Notified 1805  Notification Type Page  Notification Reason Change in status

## 2021-03-30 NOTE — Progress Notes (Signed)
Pt began yelling out into hallway. Pt stated he was having trouble breathing and requesting his inhaler. Pt sats 92-93% on 2L. RN increased O2 for pts comfort and request to 4L. RN paged MD and call Respiratory therapy. MD gave verbal order to give PRN Duoneb early. After PRN treatment, pt stating that it did not help and continuing stating that he can not breath. O2 dropped to 86-88%  as pt shallow rapid breathing. HR 118-120s. RR 26-30. Non-rebreather placed on pt and MD paged again. New orders for STAT chest XRAY

## 2021-03-30 NOTE — Progress Notes (Signed)
Nutrition Follow-up  DOCUMENTATION CODES:   Underweight, Severe malnutrition in context of chronic illness  INTERVENTION:   - Continue Ensure Enlive po QID, each supplement provides 350 kcal and 20 grams of protein   - Continue Regular diet order   - Provide feeding assistance as needed  NUTRITION DIAGNOSIS:   Severe Malnutrition related to chronic illness (COPD, dementia) as evidenced by severe muscle depletion, severe fat depletion.  Ongoing, being addressed via oral nutrition supplements  GOAL:   Patient will meet greater than or equal to 90% of their needs  Progressing  MONITOR:   PO intake, Supplement acceptance, Labs, Weight trends  REASON FOR ASSESSMENT:   Other (underweight BMI)    ASSESSMENT:   75 year old who presented to the ED on 7/10 from SNF with SOB. PMH of dementia, COPD, lung blebs, chronic respiratory failure, pneumothorax, bronchoalveolar fistula s/p pleurodesis, blindness. Pt admitted with right-sided pneumothorax, SIRS.  7/09 - chest tube inserted  Spoke with pt at bedside. Pt requesting his inhaler, appeared anxious. RN aware.  Pt reports that he continues to eat well. He reports consuming 100% of breakfast meal this morning. He states that he is drinking his Ensure supplements. Will continue with current nutrition interventions.  Admit weight: 43.5 kg Current weight: 45.9 kg  Weight is trending up during admission which is favorable given severe malnutrition.  Noted plan for pt to d/c to SNF with chest tube.  Meal Completion: 80-100% x last 8 documented meals  Medications reviewed and include: Ensure Enlive QID, remeron  Labs reviewed: potassium 5.2, BUN 47, hemoglobin 8.7  UOP: 1525 ml x 24 hour CT: 0 ml x 24 hours  Diet Order:   Diet Order             Diet regular Room service appropriate? Yes; Fluid consistency: Thin  Diet effective now                   EDUCATION NEEDS:   Not appropriate for education at this  time  Skin:  Skin Assessment: Reviewed RN Assessment  Last BM:  03/31/21 smear  Height:   Ht Readings from Last 1 Encounters:  03/30/2021 5\' 7"  (1.702 m)    Weight:   Wt Readings from Last 1 Encounters:  03/31/21 45.9 kg    BMI:  Body mass index is 15.85 kg/m.  Estimated Nutritional Needs:   Kcal:  1450-1650  Protein:  65-80 grams  Fluid:  1.4-1.6 L    Gustavus Bryant, MS, RD, LDN Inpatient Clinical Dietitian Please see AMiON for contact information.

## 2021-03-30 NOTE — Progress Notes (Signed)
Physical Therapy Treatment Patient Details Name: Alan Huff MRN: [id number] DOB: Feb 16, 1946 Today's Date: 03/30/2021    History of Present Illness Pt is a 75 y.o. M who presents with large right sided PTX and had chest tube placed. Significant PMH: COPD on 2-4L, prior history of PTX, cognitive impairment.    PT Comments    Session focused on bed level exercises for strengthening and transfer from bed to chair. Pt requiring two person maximal assist for lateral scoot transfer from bed to chair (pt has not stood in a significant period of time). SpO2 > 90% on 2L O2. Will continue to follow acutely to promote mobility.     Follow Up Recommendations  SNF     Equipment Recommendations  None recommended by PT    Recommendations for Other Services       Precautions / Restrictions Precautions Precautions: Fall;Other (comment) Precaution Comments: chest tube, low vision (legally blind), very HOH Restrictions Weight Bearing Restrictions: No    Mobility  Bed Mobility Overal bed mobility: Needs Assistance Bed Mobility: Supine to Sit     Supine to sit: Max assist     General bed mobility comments: Decreased initiation, assist for BLE's and trunk to upright. Use of bed pad to scoot hips forward    Transfers Overall transfer level: Needs assistance Equipment used: 2 person hand held assist Transfers: Lateral/Scoot Transfers          Lateral/Scoot Transfers: Max assist;+2 physical assistance General transfer comment: Lateral scoot transfer from bed to chair towards right  Ambulation/Gait                 Stairs             Wheelchair Mobility    Modified Rankin (Stroke Patients Only)       Balance Overall balance assessment: Needs assistance Sitting-balance support: Feet unsupported Sitting balance-Leahy Scale: Fair                                      Cognition Arousal/Alertness: Awake/alert Behavior During Therapy: Flat  affect Overall Cognitive Status: History of cognitive impairments - at baseline                                 General Comments: Able to follow 1 step commands      Exercises General Exercises - Lower Extremity Long Arc Quad: Both;10 reps;Seated Heel Slides: Both;10 reps;Supine Straight Leg Raises: Both;10 reps;Supine    General Comments        Pertinent Vitals/Pain Pain Assessment: Faces Faces Pain Scale: Hurts a little bit Pain Location: discomfort at chest tube site Pain Descriptors / Indicators: Grimacing Pain Intervention(s): Monitored during session    Home Living                      Prior Function            PT Goals (current goals can now be found in the care plan section) Acute Rehab PT Goals Patient Stated Goal: did not state Potential to Achieve Goals: Fair Progress towards PT goals: Progressing toward goals    Frequency    Min 2X/week      PT Plan Current plan remains appropriate    Co-evaluation              AM-PAC PT "6  Clicks" Mobility   Outcome Measure  Help needed turning from your back to your side while in a flat bed without using bedrails?: A Little Help needed moving from lying on your back to sitting on the side of a flat bed without using bedrails?: A Lot Help needed moving to and from a bed to a chair (including a wheelchair)?: Total Help needed standing up from a chair using your arms (e.g., wheelchair or bedside chair)?: Total Help needed to walk in hospital room?: Total Help needed climbing 3-5 steps with a railing? : Total 6 Click Score: 9    End of Session Equipment Utilized During Treatment: Gait belt;Oxygen Activity Tolerance: Patient tolerated treatment well Patient left: with call bell/phone within reach;in chair;with chair alarm set Nurse Communication: Mobility status PT Visit Diagnosis: Muscle weakness (generalized) (M62.81);Other abnormalities of gait and mobility (R26.89)     Time:  1282-0813 PT Time Calculation (min) (ACUTE ONLY): 23 min  Charges:  $Therapeutic Activity: 23-37 mins                     Wyona Almas, PT, DPT Acute Rehabilitation Services Pager [phone] Office [phone]    Deno Etienne 03/30/2021, 1:04 PM

## 2021-03-30 NOTE — Progress Notes (Addendum)
      [address]       [place] [address]             [phone]      Subjective:  Patient w/o complaints this morning.  Denies pain.  Objective: Vital signs in last 24 hours: Temp:  [97.4 F (36.3 C)-98.3 F (36.8 C)] 98.3 F (36.8 C) (07/20 0755) Pulse Rate:  [77-95] 77 (07/20 0755) Cardiac Rhythm: Normal sinus rhythm;Heart block (07/20 0756) Resp:  [17-24] 18 (07/20 0755) BP: (119-134)/(56-70) 127/56 (07/20 0755) SpO2:  [96 %-100 %] 98 % (07/20 0755)  Intake/Output from previous day: 07/19 0701 - 07/20 0700 In: 1560 [P.O.:1560] Out: 1370 [Urine:1350; Chest Tube:20]  General appearance: alert, cooperative, and no distress Heart: regular rate and rhythm Lungs: clear to auscultation bilaterally Wound: clean and dry  Lab Results: Recent Labs    03/29/21 0547  WBC 10.2  HGB 8.9*  HCT 28.8*  PLT 421*   BMET:  Recent Labs    03/29/21 0547  NA 140  K 4.4  CL 100  CO2 34*  GLUCOSE 115*  BUN 42*  CREATININE 0.84  CALCIUM 8.5*    PT/INR: No results for input(s): LABPROT, INR in the last 72 hours. ABG    Component Value Date/Time   PHART 7.220 (L) 04/09/2021 1633   HCO3 26.7 03/11/2021 1633   TCO2 25.7 10/30/2014 0500   O2SAT 91.3 03/31/2021 1633   CBG (last 3)  No results for input(s): GLUCAP in the last 72 hours.  Assessment/Plan:  Pneumothorax- chest tube with 1+ air leak on 10 cm suction, CXR with stable appearance of pneumothorax... will place to water seal today, repeat CXR in AM... goal will be to transition to [facility], care per primary   LOS: 11 days    Ellwood Handler, PA-C 03/30/2021  Agree with above. Will transition to waterseal. Continue pulmonary toilet. If nursing facility can take him with a chest tube and we will convert to mini express.  Michaline Kindig Bary Leriche

## 2021-03-30 NOTE — Progress Notes (Signed)
PROGRESS NOTE  [name] QHU:[id number] DOB: 07-16-1946 DOA: 03/29/2021 PCP: [place]  HPI/Recap of past 24 hours: This is a 75 year old male with COPD, chronic respiratory failure on 3 to 4 L/min home O2 history of pneumothorax, history of lung blebs, bronchial alveolar fistula status post pleurodesis.  He presented with respiratory distress and chest discomfort chest x-ray in the ER showed right chest pneumothorax.  Chest tube was inserted and his symptom has improved.  Assessment/Plan: Active Problems:   Lung blebs (HCC)   Persistent cognitive impairment   Pneumothorax, right   COPD (chronic obstructive pulmonary disease) (HCC)   Chronic respiratory failure with hypoxia (HCC)   Protein-calorie malnutrition, severe  Acute on chronic hypoxic respiratory failure due to pneumothorax Right pneumothorax - recurrent given history as above Chest tube in place and seal is good; Cardiothoracic surgery is following he remains a poor surgical candidate - discussion about transitioning to mini express for discharge.  Unfortunately SNF does not appear to be willing to accept patient back with this device -may be able to transition to [facility] Palliative consult following  COPD.  Stable Continue inhalers ICS/LABA/LAMA  Chronic anemia of chronic disease No active bleeding Will monitor hemoglobin Will transfuse if hemoglobin drops to 7  Unspecified mood disorder  Continue mirtazapine  BPH continue Flomax  Severe protein calorie malnutrition Continue supplemental feeding  Code Status: DNR Family Communication: None at bedside Disposition Plan: To be determined.  Physical therapy is recommending return to SNF -may require LTAC due to ongoing need for chest tube placement -consider discharge with mini express  consultants: Cardiothoracic Palliative care  Procedures: Thoracentesis  Antimicrobials: None  DVT prophylaxis: Lovenox  Subjective Remains pleasantly confused,  alert to person and general situation only.  Review of systems limited but denies nausea vomiting diarrhea constipation headache fevers chills or chest pain.    Objective: Vitals:   03/29/21 1911 03/29/21 2004 03/29/21 2313 03/30/21 0412  BP: 129/70  127/61 134/63  Pulse: 95  86 78  Resp: (!) 22  18 17   Temp: 97.8 F (36.6 C)  97.9 F (36.6 C) 97.7 F (36.5 C)  TempSrc: Oral  Oral Oral  SpO2: 96% 97% 97% 100%  Weight:      Height:        Intake/Output Summary (Last 24 hours) at 03/30/2021 0747 Last data filed at 03/30/2021 0503 Gross per 24 hour  Intake 1560 ml  Output 1370 ml  Net 190 ml    Filed Weights   03/12/2021 1610 03/29/2021 2331 03/21/21 0400  Weight: 52.2 kg 43.5 kg 45.9 kg   Body mass index is 15.85 kg/m.  Exam:  General:  Pleasantly resting in bed, No acute distress. HEENT:  Normocephalic atraumatic.  Sclerae nonicteric, noninjected.  Extraocular movements intact bilaterally. Neck:  Without mass or deformity.  Trachea is midline. Lungs:  Clear to auscultate bilaterally without rhonchi, wheeze, or rales.  Left chest tube with continued air leak Heart:  Regular rate and rhythm.  Without murmurs, rubs, or gallops. Abdomen:  Soft, nontender, nondistended.  Without guarding or rebound. Extremities: Without cyanosis, clubbing, edema, or obvious deformity. Vascular:  Dorsalis pedis and posterior tibial pulses palpable bilaterally. Skin:  Warm and dry, no erythema, no ulcerations.  Data Reviewed: CBC: Recent Labs  Lab 03/29/21 0547  WBC 10.2  HGB 8.9*  HCT 28.8*  MCV 98.6  PLT 421*    Basic Metabolic Panel: Recent Labs  Lab 03/29/21 0547  NA 140  K 4.4  CL 100  CO2 34*  GLUCOSE 115*  BUN 42*  CREATININE 0.84  CALCIUM 8.5*    GFR: Estimated Creatinine Clearance: 48.6 mL/min (by C-G formula based on SCr of 0.84 mg/dL). Liver Function Tests: No results for input(s): AST, ALT, ALKPHOS, BILITOT, PROT, ALBUMIN in the last 168 hours.  No results  for input(s): LIPASE, AMYLASE in the last 168 hours. No results for input(s): AMMONIA in the last 168 hours. Coagulation Profile: No results for input(s): INR, PROTIME in the last 168 hours.  Cardiac Enzymes: No results for input(s): CKTOTAL, CKMB, CKMBINDEX, TROPONINI in the last 168 hours. BNP (last 3 results) No results for input(s): PROBNP in the last 8760 hours. HbA1C: No results for input(s): HGBA1C in the last 72 hours. CBG: No results for input(s): GLUCAP in the last 168 hours. Lipid Profile: No results for input(s): CHOL, HDL, LDLCALC, TRIG, CHOLHDL, LDLDIRECT in the last 72 hours. Thyroid Function Tests: No results for input(s): TSH, T4TOTAL, FREET4, T3FREE, THYROIDAB in the last 72 hours. Anemia Panel: No results for input(s): VITAMINB12, FOLATE, FERRITIN, TIBC, IRON, RETICCTPCT in the last 72 hours. Urine analysis:    Component Value Date/Time   COLORURINE YELLOW 03/22/2021 2345   APPEARANCEUR HAZY (A) 03/12/2021 2345   LABSPEC 1.018 04/01/2021 2345   PHURINE 6.0 03/25/2021 2345   GLUCOSEU 50 (A) 03/24/2021 2345   HGBUR NEGATIVE 04/07/2021 2345   BILIRUBINUR NEGATIVE 03/11/2021 2345   KETONESUR 5 (A) 03/26/2021 2345   PROTEINUR 30 (A) 03/24/2021 2345   UROBILINOGEN 0.2 11/04/2014 1239   NITRITE NEGATIVE 03/30/2021 2345   LEUKOCYTESUR MODERATE (A) 04/04/2021 2345   Sepsis Labs: @LABRCNTIP (procalcitonin:4,lacticidven:4)  ) No results found for this or any previous visit (from the past 240 hour(s)).     Studies: DG CHEST PORT 1 VIEW  Result Date: 03/30/2021 CLINICAL DATA:  Right pneumothorax. EXAM: PORTABLE CHEST 1 VIEW COMPARISON:  03/29/2021.  01/04/2015.  CT 03/23/2021. FINDINGS: Right chest tube in stable position. Stable small right pneumothorax. Heart size normal. Stable chronic interstitial changes. Stable bibasilar atelectasis. Stable basilar pleural thickening consistent scarring. IMPRESSION: Right chest tube in stable position. Stable small right  pneumothorax. Electronically Signed   By: Marcello Moores  Register   On: 03/30/2021 06:25   DG CHEST PORT 1 VIEW  Result Date: 03/29/2021 CLINICAL DATA:  Follow-up chest tube EXAM: PORTABLE CHEST 1 VIEW COMPARISON:  Film from the previous day. FINDINGS: Pigtail catheter is again noted on the right. Stable right pneumothorax is noted. Cardiac shadow is stable. The lungs are well aerated with mild bilateral scarring. New focal abnormality is noted. IMPRESSION: Stable right apical pneumothorax with chest tube in place. Electronically Signed   By: Inez Catalina M.D.   On: 03/29/2021 09:13    Scheduled Meds:  chlorhexidine  15 mL Mouth Rinse BID   enoxaparin (LOVENOX) injection  30 mg Subcutaneous Q24H   feeding supplement  237 mL Oral QID   mouth rinse  15 mL Mouth Rinse q12n4p   mirtazapine  7.5 mg Oral QHS   mometasone-formoterol  2 puff Inhalation BID   tamsulosin  0.4 mg Oral Daily   umeclidinium bromide  1 puff Inhalation Daily     LOS: 11 days   Little Ishikawa, DO [facility]  03/30/2021, 7:47 AM

## 2021-03-31 ENCOUNTER — Inpatient Hospital Stay (HOSPITAL_COMMUNITY): Payer: No Typology Code available for payment source

## 2021-03-31 DIAGNOSIS — J439 Emphysema, unspecified: Secondary | ICD-10-CM | POA: Diagnosis not present

## 2021-03-31 DIAGNOSIS — F419 Anxiety disorder, unspecified: Secondary | ICD-10-CM | POA: Diagnosis not present

## 2021-03-31 DIAGNOSIS — J939 Pneumothorax, unspecified: Secondary | ICD-10-CM | POA: Diagnosis not present

## 2021-03-31 DIAGNOSIS — Z515 Encounter for palliative care: Secondary | ICD-10-CM | POA: Diagnosis not present

## 2021-03-31 DIAGNOSIS — R0602 Shortness of breath: Secondary | ICD-10-CM | POA: Diagnosis not present

## 2021-03-31 DIAGNOSIS — Z7189 Other specified counseling: Secondary | ICD-10-CM | POA: Diagnosis not present

## 2021-03-31 DIAGNOSIS — J9611 Chronic respiratory failure with hypoxia: Secondary | ICD-10-CM | POA: Diagnosis not present

## 2021-03-31 DIAGNOSIS — E43 Unspecified severe protein-calorie malnutrition: Secondary | ICD-10-CM | POA: Diagnosis not present

## 2021-03-31 LAB — BASIC METABOLIC PANEL
Anion gap: 7 (ref 5–15)
BUN: 47 mg/dL — ABNORMAL HIGH (ref 8–23)
CO2: 34 mmol/L — ABNORMAL HIGH (ref 22–32)
Calcium: 8.8 mg/dL — ABNORMAL LOW (ref 8.9–10.3)
Chloride: 102 mmol/L (ref 98–111)
Creatinine, Ser: 0.88 mg/dL (ref 0.61–1.24)
GFR, Estimated: >60 mL/min (ref >60)
Glucose, Bld: 107 mg/dL — ABNORMAL HIGH (ref 70–99)
Potassium: 5.2 mmol/L — ABNORMAL HIGH (ref 3.5–5.1)
Sodium: 143 mmol/L (ref 135–145)

## 2021-03-31 LAB — CBC
HCT: 27.9 % — ABNORMAL LOW (ref 39.0–52.0)
Hemoglobin: 8.7 g/dL — ABNORMAL LOW (ref 13.0–17.0)
MCH: 30.6 pg (ref 26.0–34.0)
MCHC: 31.2 g/dL (ref 30.0–36.0)
MCV: 98.2 fL (ref 80.0–100.0)
Platelets: 416 10*3/uL — ABNORMAL HIGH (ref 150–400)
RBC: 2.84 MIL/uL — ABNORMAL LOW (ref 4.22–5.81)
RDW: 15.5 % (ref 11.5–15.5)
WBC: 11.2 10*3/uL — ABNORMAL HIGH (ref 4.0–10.5)
nRBC: 0 % (ref 0.0–0.2)

## 2021-03-31 MED ORDER — MORPHINE SULFATE 10 MG/5ML PO SOLN: 2.5000 mg | ORAL | Status: DC | PRN

## 2021-03-31 MED ORDER — MORPHINE SULFATE 10 MG/5ML PO SOLN
2.5000 mg | ORAL | Status: DC | PRN
  Administered 2021-03-31 – 2021-04-04 (×6): 2.5 mg via ORAL
  Filled 2021-03-31 (×6): qty 2

## 2021-03-31 MED ORDER — ALBUTEROL SULFATE (2.5 MG/3ML) 0.083% IN NEBU
3.0000 mL | INHALATION_SOLUTION | RESPIRATORY_TRACT | Status: DC | PRN
  Administered 2021-04-02 – 2021-04-03 (×3): 3 mL via RESPIRATORY_TRACT
  Filled 2021-03-31 (×3): qty 3

## 2021-03-31 MED ORDER — IPRATROPIUM-ALBUTEROL 0.5-2.5 (3) MG/3ML IN SOLN
3.0000 mL | RESPIRATORY_TRACT | Status: DC | PRN
  Administered 2021-03-31 – 2021-04-04 (×5): 3 mL via RESPIRATORY_TRACT
  Filled 2021-03-31 (×5): qty 3

## 2021-03-31 MED ORDER — IPRATROPIUM-ALBUTEROL 0.5-2.5 (3) MG/3ML IN SOLN
3.0000 mL | RESPIRATORY_TRACT | Status: DC | PRN
Start: 2021-03-31 — End: 2021-03-31

## 2021-03-31 MED ORDER — BUSPIRONE HCL 5 MG PO TABS
5.0000 mg | ORAL_TABLET | Freq: Three times a day (TID) | ORAL | Status: DC
  Administered 2021-03-31 – 2021-04-03 (×10): 5 mg via ORAL
  Filled 2021-03-31 (×10): qty 1

## 2021-03-31 MED ORDER — ALBUTEROL SULFATE (2.5 MG/3ML) 0.083% IN NEBU: 3.0000 mL | INHALATION_SOLUTION | RESPIRATORY_TRACT | Status: DC | PRN

## 2021-03-31 MED ORDER — ALBUTEROL SULFATE HFA 108 (90 BASE) MCG/ACT IN AERS
2.0000 | INHALATION_SPRAY | RESPIRATORY_TRACT | Status: DC | PRN
  Administered 2021-03-31 – 2021-04-04 (×2): 2 via RESPIRATORY_TRACT
  Filled 2021-03-31: qty 6.7

## 2021-03-31 NOTE — Progress Notes (Signed)
   03/31/21 1030  Assess: MEWS Score  Pulse Rate (!) 106  ECG Heart Rate (!) 107  Resp (!) 21  SpO2 96 %  Assess: MEWS Score  MEWS Temp 0  MEWS Systolic 0  MEWS Pulse 1  MEWS RR 1  MEWS LOC 0  MEWS Score 2  MEWS Score Color Yellow  Assess: if the MEWS score is Yellow or Red  Were vital signs taken at a resting state? Yes  Focused Assessment No change from prior assessment  Early Detection of Sepsis Score *See Row Information* Low  MEWS guidelines implemented *See Row Information* No, previously yellow, continue vital signs every 4 hours  Take Vital Signs  Increase Vital Sign Frequency  Yellow: Q 2hr X 2 then Q 4hr X 2, if remains yellow, continue Q 4hrs  Escalate  MEWS: Escalate Yellow: discuss with charge nurse/RN and consider discussing with provider and RRT  Notify: Charge Nurse/RN  Name of Charge Nurse/RN Notified Heather  Date Charge Nurse/RN Notified 03/31/21  Time Charge Nurse/RN Notified 1030  Notify: Provider  Provider Name/Title Dr. Avon Gully  Date Provider Notified 03/31/21  Time Provider Notified 0930  Notification Type Page  Notification Reason Change in status  Provider response At bedside;See new orders  Date of Provider Response 03/31/21  Time of Provider Response 1030

## 2021-03-31 NOTE — Progress Notes (Signed)
   03/31/21 0905  Vital Signs  Pulse Rate 91  ECG Heart Rate 92  Resp (!) 23  Oxygen Therapy  SpO2 94 %  MEWS Score  MEWS Temp 0  MEWS Systolic 0  MEWS Pulse 0  MEWS RR 1  MEWS LOC 0  MEWS Score 1  MEWS Score Color Green    Patient with anxiety, shortness of breath. Patient given duo-neb for shortness of breath and work of breathing.

## 2021-03-31 NOTE — TOC Progression Note (Signed)
Transition of Care [facility]) - Progression Note    Patient Details  Name: Alan Huff MRN: [id number] Date of Birth: [birth year]  Transition of Care [facility]) CM/SW [place], [place] Phone Number: 03/31/2021, 11:43 AM  Clinical Narrative:     Called patient's daughter,Shelli, unable to leave message- she works nightshift at hospital- [place] will attempt to call later- will confirmed disposition plan of LTAC when patient is medically stable for discharge. CSW spoke with Raquel Sarna w/[facility]- she will start process for LTAC- CSW will update of any changes  TOC will continue to follow and assist with discharge plan.  Thurmond Butts, MSW, LCSW Clinical Social Worker    Expected Discharge Plan: Skilled Nursing Facility Barriers to Discharge: Continued Medical Work up, SNF Pending bed offer  Expected Discharge Plan and Services Expected Discharge Plan: [place] In-house Referral: Clinical Social Work     Living arrangements for the past 2 months: [place]                                       Social Determinants of Health (SDOH) Interventions    Readmission Risk Interventions No flowsheet data found.

## 2021-03-31 NOTE — Progress Notes (Signed)
   03/31/21 0905 03/31/21 0930  Vital Signs  Pulse Rate 91 (!) 109  Pulse Rate Source  --  Monitor  ECG Heart Rate 92 (!) 109  Resp (!) 23 (!) 22  Oxygen Therapy  SpO2  --  96 %  O2 Device  --  Nasal Cannula  O2 Flow Rate (L/min)  --  5 L/min  MEWS Score  MEWS Temp 0 0  MEWS Systolic 0 0  MEWS Pulse 0 1  MEWS RR 1 1  MEWS LOC 0 0  MEWS Score 1 2  MEWS Score Color Green Yellow    Paged Dr. Avon Gully after duo-neb completed and patient still with tachypnea and shortness of breath. Provided patient with fan and other comfort measures to assist with anxiety which contributes to shortness of breath.

## 2021-03-31 NOTE — Progress Notes (Signed)
0904 pt called out for sob, appearing very anxious. Neb tx was given.  [id number] Provider came to room, and did not want to order meds for anxiety at this time. Pt was adamant about inhalers working. Albuterol inhaler was ordered.  1200 Inhaler sent by pharmacy, inhaler given to pt.  Pt not remembering he had taken inhalers and asked for them again. Pt has been very anxious since this morning and refuses to let staff clean him, refuses to eat lunch, stating he "needs to breathe better first". He calls out for help and doesn't want to be left alone in room.   1318 Pt offered neb tx per order.   1330 Palliative care NP spoke with CN,order placed for buspar. Buspar given. Pt agreed to eat a cookie.   At this time, Provider not comfortable giving morphine without family consent. Attempts to contact daughter for discussion of care.  Will provide pt emotional care and inhaler/neb tx as available per provider orders.  1500 Pt sleeping  1400 Palliative spoke with daughter. Morphine 2.5 mg prn ordered.  Pt awake,stating he's still having trouble with breathing.  1430 Morphine given.  1730 Pt agreed to let RN bathe him,was able to eat a little dinner. Seems calmer since morphine.  States he feels "pretty good". Sleeping now.

## 2021-03-31 NOTE — Progress Notes (Addendum)
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  He was found to have large right-sided pneumothorax and had chest tube placed in the [facility] emergency room.  Pneumothorax resolved after chest tube placement, but has had a persistent air leak since then.  Palliative care following to help address goals of care.  General surgery is also been following for chest tube management.  At this point, does not appear to be reasonable to remove chest tube due to persistent air leak.  Patient is being transferred to [facility] for cardiothoracic surgery/pulmonology consultation to see if there are any less invasive options to help manage pneumothorax.  Palliative care was asked to continue along with goals of care conversations for [facility] pending CTS evaluation.  Today's Discussion (03/31/2021):  *Please note that this is a verbal dictation therefore any spelling or grammatical errors are due to the "[place] [employer]" system interpretation.  Chart reviewed.  Kyzer has been very anxious per chart review. He is yelling out and sharing with the staff that he is unable to breath.  Upon assessment, Eliazar is sharing that today is not a good day as he is having difficulty with his breathing. Reviewed starting a low dose medicine to help alleviate some of these anxieties.   I have tried to call patients daughter, [name] to inform her of these changes and further discuss additional symptom management but VM does not allow a message to be left.   I called the nursing staff on 4NP and provided a secure chat to hospitalist to make aware of medication changes. Given severity of COPD would strongly consider low dose morphine to be utilized PRN to help symptoms but would like to further converse with family to  verify this is aligned with goals.   Questions and concerns addressed   Objective Assessment: Vital Signs Vitals:   03/31/21 1030 03/31/21 1053  BP:  (!) 198/77  Pulse: (!) 106 98  Resp: (!) 21 20  Temp:  97.6 F (36.4 C)  SpO2: 96% 94%    Intake/Output Summary (Last 24 hours) at 03/31/2021 1330 Last data filed at 03/31/2021 0859 Gross per 24 hour  Intake 700 ml  Output 1075 ml  Net -375 ml    Last Weight  Most recent update: 03/21/2021  4:02 AM    Weight  45.9 kg (101 lb 3.1 oz)            Physical Exam Constitutional:      Comments: Frail, cachetic, disheveled  Pulmonary:On 5LPM [place] CV: Irregularly irregular Skin:    General: Skin is warm and dry.    Coloration: Skin is pale with multiple areas of ecchymosis Neurological:    Mental Status: He is oriented to person, anxious  SUMMARY OF RECOMMENDATIONS   DNAR/DNI  No plans for surgery per CTS they remain to manage the chest tube  TOC - Close OP Palliative support. Patient will need LTACH placement for chest tube management  Appreciate PT/OT  PMT will continue to offer support throughout the weekend  Symptoms: Anxiety:  - Initiate Buspar 5mg  PO TID  Dyspnea:  - Supplemental O2 - Would benefit from low dose morphine 2.5mg  PO Q3H PRN, confirm family amenable towards this  Time Spent: 25 Greater than 50% of the time was spent in counseling and coordination of care ______________________________________________________________________________________ Sharyn Lull  [place] Team Team Cell Phone: [phone] Please utilize secure chat with additional questions, if there is no response within 30 minutes please call the above phone number  Palliative Medicine Team providers are available by phone from 7am to 7pm daily and can be reached through the team cell phone.  Should this patient require assistance outside of these hours, please call the patient's attending  physician.

## 2021-03-31 NOTE — Progress Notes (Addendum)
PROGRESS NOTE  Alan Huff UDJ:[id number] DOB: 09-24-1945 DOA: 03/28/2021 PCP: [facility]  HPI/Recap of past 24 hours: This is a 75 year old male with COPD, chronic respiratory failure on 3 to 4 L/min home O2 history of pneumothorax, history of lung blebs, bronchial alveolar fistula status post pleurodesis.  He presented with respiratory distress and chest discomfort chest x-ray in the ER showed right chest pneumothorax.  Chest tube was inserted and his symptom has improved.  Assessment/Plan: Active Problems:   Lung blebs (HCC)   Persistent cognitive impairment   Pneumothorax, right   COPD (chronic obstructive pulmonary disease) (HCC)   Chronic respiratory failure with hypoxia (HCC)   Protein-calorie malnutrition, severe  Goals of care - Lengthy discussion with daughter today - She and her brother (patient's son) are currently agreeable to low dose oral morphine for comfort - We discussed his baseline lung status and given his non-surgical status he may not be candidate for more than supportive care and chest tube - Will avoid benzos due to history of worsening agitation/delirium in the past where he pulled out chest tube and required intubation - If patient's status deteriorates will progress towards hospice  Acute on chronic hypoxic respiratory failure due to pneumothorax Tachypnea/Tachycardia Right pneumothorax - recurrent given history as above Cardiothoracic surgery is following he remains a poor surgical candidate - discussion about transitioning to [facility] for discharge. Unfortunately SNF does not appear to be willing to accept patient back with this device -may be able to transition to LTAC  - follow with CM/TOC. Unclear if talc pleurodesis is an option given patient's poor surgical candidate status Palliative consult following Patient continues to have tachypneic and tachycardic events in the setting of chest tube placement and sensation of dyspnea but denies any  overt pain -limited intervention options given patient's mental status age and is high risk for hospital delirium, daughter indicates per nursing staff the patient has not tolerated benzodiazepines in the past.  COPD.  Stable Continue inhalers ICS/LABA/LAMA  Chronic anemia of chronic disease No active bleeding Will monitor hemoglobin Will transfuse if hemoglobin drops to 7  Unspecified mood disorder Continue mirtazapine  BPH  Continue Flomax  Severe protein calorie malnutrition Continue supplemental feeding  Code Status: DNR Family Communication: Daughter updated at length over the phone Disposition Plan: To be determined.  Physical therapy is recommending return to SNF -may require LTAC due to ongoing need for chest tube placement -consider discharge with [facility]  consultants: Cardiothoracic Palliative care  Procedures: Thoracentesis/chest tube placement  Antimicrobials: None  DVT prophylaxis: Lovenox  Subjective Patient continues to have episodes of tachypnea and tachycardia in the setting of respiratory distress without hypoxia.  He denies overt shortness of breath, chest pain, nausea vomiting diarrhea constipation headache fevers or chills -he only indicates "I cannot get a good breath or slow my breathing down"  Objective: Vitals:   03/30/21 1928 03/30/21 1930 03/30/21 2317 03/31/21 0304  BP:  (!) 126/57 132/63 120/61  Pulse:  100 92 85  Resp:  (!) 22 (!) 22 17  Temp: 98.4 F (36.9 C)  97.7 F (36.5 C) 97.7 F (36.5 C)  TempSrc: Oral  Oral Oral  SpO2:  94% 99% 100%  Weight:      Height:        Intake/Output Summary (Last 24 hours) at 03/31/2021 0756 Last data filed at 03/31/2021 0600 Gross per 24 hour  Intake 700 ml  Output 1525 ml  Net -825 ml    [employer]  03/12/2021 1610 04/04/2021 2331 03/21/21 0400  Weight: 52.2 kg 43.5 kg 45.9 kg   Body mass index is 15.85 kg/m.  Exam:  General: Cachectic appearing gentleman, pleasantly resting in  bed, No acute distress. HEENT:  Normocephalic atraumatic.  Sclerae nonicteric, noninjected.  Extraocular movements intact bilaterally. Neck:  Without mass or deformity.  Trachea is midline. Lungs:  Clear to auscultate bilaterally without rhonchi, wheeze, or rales.  Left chest tube with continued air leak Heart:  Regular rate and rhythm.  Without murmurs, rubs, or gallops. Abdomen:  Soft, nontender, nondistended.  Without guarding or rebound. Extremities: Without cyanosis, clubbing, edema, or obvious deformity. Vascular:  Dorsalis pedis and posterior tibial pulses palpable bilaterally. Skin:  Warm and dry, no erythema, no ulcerations.  Data Reviewed: CBC: Recent Labs  Lab 03/29/21 0547 03/31/21 0659  WBC 10.2 11.2*  HGB 8.9* 8.7*  HCT 28.8* 27.9*  MCV 98.6 98.2  PLT 421* 416*    Basic Metabolic Panel: Recent Labs  Lab 03/29/21 0547  NA 140  K 4.4  CL 100  CO2 34*  GLUCOSE 115*  BUN 42*  CREATININE 0.84  CALCIUM 8.5*    GFR: Estimated Creatinine Clearance: 48.6 mL/min (by C-G formula based on SCr of 0.84 mg/dL). Liver Function Tests: No results for input(s): AST, ALT, ALKPHOS, BILITOT, PROT, ALBUMIN in the last 168 hours.  No results for input(s): LIPASE, AMYLASE in the last 168 hours. No results for input(s): AMMONIA in the last 168 hours. Coagulation Profile: No results for input(s): INR, PROTIME in the last 168 hours.  Cardiac Enzymes: No results for input(s): CKTOTAL, CKMB, CKMBINDEX, TROPONINI in the last 168 hours. BNP (last 3 results) No results for input(s): PROBNP in the last 8760 hours. HbA1C: No results for input(s): HGBA1C in the last 72 hours. CBG: No results for input(s): GLUCAP in the last 168 hours. Lipid Profile: No results for input(s): CHOL, HDL, LDLCALC, TRIG, CHOLHDL, LDLDIRECT in the last 72 hours. Thyroid Function Tests: No results for input(s): TSH, T4TOTAL, FREET4, T3FREE, THYROIDAB in the last 72 hours. Anemia Panel: No results for  input(s): VITAMINB12, FOLATE, FERRITIN, TIBC, IRON, RETICCTPCT in the last 72 hours. Urine analysis:    Component Value Date/Time   COLORURINE YELLOW 04/09/2021 2345   APPEARANCEUR HAZY (A) 03/24/2021 2345   LABSPEC 1.018 04/09/2021 2345   PHURINE 6.0 04/03/2021 2345   GLUCOSEU 50 (A) 04/08/2021 2345   HGBUR NEGATIVE 03/20/2021 2345   BILIRUBINUR NEGATIVE 03/22/2021 2345   KETONESUR 5 (A) 03/23/2021 2345   PROTEINUR 30 (A) 03/30/2021 2345   UROBILINOGEN 0.2 11/04/2014 1239   NITRITE NEGATIVE 03/26/2021 2345   LEUKOCYTESUR MODERATE (A) 04/09/2021 2345   Sepsis Labs: @LABRCNTIP (procalcitonin:4,lacticidven:4)  ) No results found for this or any previous visit (from the past 240 hour(s)).     Studies: DG CHEST PORT 1 VIEW  Result Date: 03/31/2021 CLINICAL DATA:  Shortness of breath.  Pneumothorax. EXAM: PORTABLE CHEST 1 VIEW COMPARISON:  03/30/2021. FINDINGS: Right chest tube in stable position. Stable small right pneumothorax again noted. Heart size stable. COPD. Persistent bibasilar atelectasis and or scarring. Skin folds noted on the left. Tiny right pleural effusion versus pleural scarring. IMPRESSION: 1. Right chest tube in stable position. Stable small right pneumothorax again noted. 2.  COPD with persistent bibasilar atelectasis and or scarring. Electronically Signed   By: Marcello Moores  Register   On: 03/31/2021 07:01   DG CHEST PORT 1 VIEW  Result Date: 03/30/2021 CLINICAL DATA:  Short of breath EXAM: PORTABLE CHEST  1 VIEW COMPARISON:  03/30/2021 FINDINGS: Single frontal view of the chest demonstrates stable indwelling right pigtail drainage catheter unchanged in position. There is a persistent but decreased trace right apical pneumothorax, volume estimated far less than 5%. Background scarring again seen throughout the lungs compatible with emphysema. No airspace disease or effusion. No acute bony abnormalities. IMPRESSION: 1. Persistent but decreased right apical pneumothorax, volume  estimated far less than 5%. 2. Stable emphysema. Electronically Signed   By: Randa Ngo M.D.   On: 03/30/2021 18:43    Scheduled Meds:  chlorhexidine  15 mL Mouth Rinse BID   enoxaparin (LOVENOX) injection  30 mg Subcutaneous Q24H   feeding supplement  237 mL Oral QID   mouth rinse  15 mL Mouth Rinse q12n4p   mirtazapine  7.5 mg Oral QHS   mometasone-formoterol  2 puff Inhalation BID   tamsulosin  0.4 mg Oral Daily   umeclidinium bromide  1 puff Inhalation Daily     LOS: 12 days   Little Ishikawa, DO [facility]  03/31/2021, 7:56 AM

## 2021-03-31 NOTE — Progress Notes (Addendum)
      [address]       [place] [address]             [phone]       Subjective:  Patient denies pain. Daughter at bedside and she states patient is having difficulty in the evenings with extreme anxiety.  She is requesting something to assist with this.  Objective: Vital signs in last 24 hours: Temp:  [97.7 F (36.5 C)-98.4 F (36.9 C)] 97.7 F (36.5 C) (07/21 0304) Pulse Rate:  [77-124] 85 (07/21 0304) Cardiac Rhythm: Normal sinus rhythm (07/21 0700) Resp:  [17-25] 17 (07/21 0304) BP: (120-149)/(56-75) 120/61 (07/21 0304) SpO2:  [86 %-100 %] 100 % (07/21 0304)  Intake/Output from previous day: 07/20 0701 - 07/21 0700 In: 700 [P.O.:700] Out: 1525 [Urine:1525]  General appearance: alert, cooperative, and no distress Heart: regular rate and rhythm Lungs: clear to auscultation bilaterally Wound: clean and dry  Lab Results: Recent Labs    03/29/21 0547  WBC 10.2  HGB 8.9*  HCT 28.8*  PLT 421*   BMET:  Recent Labs    03/29/21 0547  NA 140  K 4.4  CL 100  CO2 34*  GLUCOSE 115*  BUN 42*  CREATININE 0.84  CALCIUM 8.5*    PT/INR: No results for input(s): LABPROT, INR in the last 72 hours. ABG    Component Value Date/Time   PHART 7.220 (L) 03/22/2021 1633   HCO3 26.7 04/02/2021 1633   TCO2 25.7 10/30/2014 0500   O2SAT 91.3 04/09/2021 1633   CBG (last 3)  No results for input(s): GLUCAP in the last 72 hours.  Assessment/Plan:  Pneumothorax- was placed to water seal yesterday, CXR remains stable in appearance.  Air leak is slightly increased... will leave patient on water seal for now... d/c to nursing home with CT in place is difficult to find facility to take chest tube Anxiety/panic attack- occurring in the evenings, will defer to medicine on request for low dose xanax   LOS: 12 days   Ellwood Handler, PA-C 03/31/2021 Patient seen and examined, agree with above Appears chronically ill but no acute distress CT in place on water  seal, air leak persists Can't go to SNF with tube and per RN family does not want LTACH Will eave tube on water seal for now, repeat CXR in AM  Dekker Verga C. Roxan Hockey, MD [facility] [phone]

## 2021-03-31 NOTE — TOC Progression Note (Signed)
Transition of Care [facility]) - Progression Note    Patient Details  Name: Alan Huff MRN: [id number] Date of Birth: 1946-04-17  Transition of Care [facility]) CM/SW [place], [place] Phone Number: 03/31/2021, 5:43 PM  Clinical Narrative:     CSW spoke with patient's daughter [name]- informed [facility] has offered placement - she expressed reconsidering his goasl of care and no longer interested in [facility].   CSW sent message to Emily/[facility] and informed of family's decision as of now and will update if any changes.  Thurmond Butts, MSW, LCSW Clinical Social Worker    Expected Discharge Plan: Skilled Nursing Facility Barriers to Discharge: Continued Medical Work up, SNF Pending bed offer  Expected Discharge Plan and Services Expected Discharge Plan: [place] In-house Referral: Clinical Social Work     Living arrangements for the past 2 months: [facility]                                       Social Determinants of Health (SDOH) Interventions    Readmission Risk Interventions No flowsheet data found.

## 2021-03-31 NOTE — Progress Notes (Signed)
   03/31/21 1030  Vitals  Pulse Rate (!) 106  ECG Heart Rate (!) 107  Resp (!) 21  MEWS COLOR  MEWS Score Color Yellow  Oxygen Therapy  SpO2 96 %  MEWS Score  MEWS Temp 0  MEWS Systolic 0  MEWS Pulse 1  MEWS RR 1  MEWS LOC 0  MEWS Score 2   Reassessment after Dr. Avon Gully at bedside, charge RN Nira Conn and two primary RN at bedside. Patient appears less anxious, more comfortable. Utilizing fan. Listening to country music to assist with anxiety.

## 2021-04-01 ENCOUNTER — Inpatient Hospital Stay (HOSPITAL_COMMUNITY): Payer: No Typology Code available for payment source

## 2021-04-01 DIAGNOSIS — J939 Pneumothorax, unspecified: Secondary | ICD-10-CM

## 2021-04-01 DIAGNOSIS — F419 Anxiety disorder, unspecified: Secondary | ICD-10-CM

## 2021-04-01 DIAGNOSIS — R0602 Shortness of breath: Secondary | ICD-10-CM | POA: Diagnosis not present

## 2021-04-01 DIAGNOSIS — Z515 Encounter for palliative care: Secondary | ICD-10-CM | POA: Diagnosis not present

## 2021-04-01 NOTE — Progress Notes (Addendum)
   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  He was found to have large right-sided pneumothorax and had chest tube placed in the [facility] emergency room.  Pneumothorax resolved after chest tube placement, but has had a persistent air leak since then.  Palliative care following to help address goals of care.  General surgery is also been following for chest tube management.  At this point, does not appear to be reasonable to remove chest tube due to persistent air leak.  Patient is being transferred to [facility] for cardiothoracic surgery/pulmonology consultation to see if there are any less invasive options to help manage pneumothorax.  Palliative care was asked to continue along with goals of care conversations for [facility] pending CTS evaluation.  Today's Discussion (04/01/2021):  *Please note that this is a verbal dictation therefore any spelling or grammatical errors are due to the "[place] One" system interpretation.  Chart reviewed.  Patient weaned back down to 3LPM [place]. Per review of CTS noted plan to place CT back on suction for increased size of apical pneumothorax.  I met with Alan Huff this afternoon. He appears far more comfortable and reports feeling "100% better". We reviewed the medications he has been initiated on to aid in further symptom management. Reviewed how stressful yesterday was as Alan Huff was "feeling like I could not breath."   Questions and concerns addressed   Objective Assessment: Vital Signs Vitals:   04/01/21 0907 04/01/21 1123  BP:  118/60  Pulse:  86  Resp:  (!) 22  Temp:  97.9 F (36.6 C)  SpO2: 99% 100%    Intake/Output Summary (Last 24 hours) at 04/01/2021 1300 Last data filed at 04/01/2021 0946 Gross per 24 hour  Intake 100 ml  Output  400 ml  Net -300 ml    Last Weight  Most recent update: 03/31/2021  1:35 PM    Weight  45.9 kg (101 lb 3.2 oz)            Physical Exam Constitutional:      Comments: Frail, cachetic, disheveled  Pulmonary:On 3LPM [place] CV: Irregularly irregular Skin:    General: Skin is warm and dry.    Coloration: Skin is pale with multiple areas of ecchymosis Neurological:    Mental Status: He is oriented to person, anxious  SUMMARY OF RECOMMENDATIONS   DNAR/DNI  No plans for surgery per CTS they remain to manage the chest tube  TOC - Close OP Palliative support. Patient will need LTACH placement for chest tube management  Appreciate PT/OT  PMT will continue to offer support as needed  Symptoms: Anxiety:  - Initiate Buspar 70m PO TID   Dyspnea:  - Supplemental O2 - Morphine 2.519mPO Q3H PRN   Time Spent: 25 Greater than 50% of the time was spent in counseling and coordination of care ______________________________________________________________________________________ [facility] [place] Team Cell Phone: [phone] utilize secure chat with additional questions, if there is no response within 30 minutes please call the above phone number  Palliative Medicine Team providers are available by phone from 7am to 7pm daily and can be reached through the team cell phone.  Should this patient require assistance outside of these hours, please call the patient's attending physician.

## 2021-04-01 NOTE — Progress Notes (Signed)
PROGRESS NOTE  Alan Huff EHM:[id number] DOB: 1946-09-05 DOA: 04/02/2021 PCP: [place]  HPI/Recap of past 24 hours: This is a 75 year old male with COPD, chronic respiratory failure on 3 to 4 L/min home O2 history of pneumothorax, history of lung blebs, bronchial alveolar fistula status post pleurodesis.  He presented with respiratory distress and chest discomfort chest x-ray in the ER showed right chest pneumothorax.  Chest tube was inserted and his symptom has improved.  Assessment/Plan: Active Problems:   Lung blebs (HCC)   Persistent cognitive impairment   Pneumothorax, right   COPD (chronic obstructive pulmonary disease) (HCC)   Chronic respiratory failure with hypoxia (HCC)   Protein-calorie malnutrition, severe  Goals of care - Lengthy discussion with daughter today - She and her brother (patient's son) are currently agreeable to low dose oral morphine for comfort - We discussed his baseline lung status and given his non-surgical status he may not be candidate for more than supportive care and chest tube - Will avoid benzos due to history of worsening agitation/delirium in the past where he pulled out chest tube and required intubation - If patient's status deteriorates will progress towards hospice -Unfortunately patient's chest x-ray this morning looks worse acutely, chest tube back on suction per CT surgery  Acute on chronic hypoxic respiratory failure due to pneumothorax Tachypnea/Tachycardia Right pneumothorax - recurrent given history as above Cardiothoracic surgery is following he remains a poor surgical candidate - discussion about transitioning to mini express for discharge. Unfortunately SNF does not appear to be willing to accept patient back with this device -may be able to transition to LTAC  - follow with CM/TOC. Patient's imaging appears to be intervally worsening today, likely etiology for patient's symptoms yesterday -symptoms improved on suction, has  not required morphine since 4 PM yesterday nearly 24 hours since last dose  COPD.  Stable Continue inhalers ICS/LABA/LAMA  Chronic anemia of chronic disease No active bleeding Will monitor hemoglobin Will transfuse if hemoglobin drops to 7  Unspecified mood disorder Continue mirtazapine  BPH  Continue Flomax  Hyperkalemia, mild  Follow repeat labs -increasing over the past 48 hours  Severe protein calorie malnutrition Continue supplemental feeding  Code Status: DNR Family Communication: Daughter updated at length over the phone Disposition Plan: To be determined.  Physical therapy is recommending return to SNF -may require LTAC due to ongoing need for chest tube placement -consider discharge with mini express  consultants: Cardiothoracic Palliative care  Procedures: Thoracentesis/chest tube placement  Antimicrobials: None  DVT prophylaxis: Lovenox  Subjective No acute issues or events overnight, this morning's imaging appears to show worsening pneumothorax, chest tube placed back on suction with CT surgery -symptoms improving, no further episodes of anxiety or dyspnea since yesterday afternoon  Objective: Vitals:   03/31/21 1936 03/31/21 2029 03/31/21 2329 04/01/21 0323  BP: (!) 107/53  (!) 109/55 123/60  Pulse: 90  85 84  Resp: 20  17 19   Temp:   98.3 F (36.8 C) 98.6 F (37 C)  TempSrc:   Oral Oral  SpO2: 99% 98% 99% 100%  Weight:      Height:        Intake/Output Summary (Last 24 hours) at 04/01/2021 0757 Last data filed at 03/31/2021 1800 Gross per 24 hour  Intake 400 ml  Output 550 ml  Net -150 ml    Filed Weights   03/15/2021 2331 03/21/21 0400 03/31/21 1335  Weight: 43.5 kg 45.9 kg 45.9 kg   Body mass index is 15.85 kg/m.  Exam:  General: Cachectic appearing gentleman, pleasantly resting in bed, No acute distress. HEENT:  Normocephalic atraumatic.  Sclerae nonicteric, noninjected.  Extraocular movements intact bilaterally. Neck:  Without  mass or deformity.  Trachea is midline. Lungs:  Clear to auscultate bilaterally without rhonchi, wheeze, or rales.  Left chest tube with continued air leak Heart:  Regular rate and rhythm.  Without murmurs, rubs, or gallops. Abdomen:  Soft, nontender, nondistended.  Without guarding or rebound. Extremities: Without cyanosis, clubbing, edema, or obvious deformity. Vascular:  Dorsalis pedis and posterior tibial pulses palpable bilaterally. Skin:  Warm and dry, no erythema, no ulcerations.  Data Reviewed: CBC: Recent Labs  Lab 03/29/21 0547 03/31/21 0659  WBC 10.2 11.2*  HGB 8.9* 8.7*  HCT 28.8* 27.9*  MCV 98.6 98.2  PLT 421* 416*    Basic Metabolic Panel: Recent Labs  Lab 03/29/21 0547 03/31/21 0659  NA 140 143  K 4.4 5.2*  CL 100 102  CO2 34* 34*  GLUCOSE 115* 107*  BUN 42* 47*  CREATININE 0.84 0.88  CALCIUM 8.5* 8.8*    GFR: Estimated Creatinine Clearance: 46.4 mL/min (by C-G formula based on SCr of 0.88 mg/dL). Liver Function Tests: No results for input(s): AST, ALT, ALKPHOS, BILITOT, PROT, ALBUMIN in the last 168 hours.  No results for input(s): LIPASE, AMYLASE in the last 168 hours. No results for input(s): AMMONIA in the last 168 hours. Coagulation Profile: No results for input(s): INR, PROTIME in the last 168 hours.  Cardiac Enzymes: No results for input(s): CKTOTAL, CKMB, CKMBINDEX, TROPONINI in the last 168 hours. BNP (last 3 results) No results for input(s): PROBNP in the last 8760 hours. HbA1C: No results for input(s): HGBA1C in the last 72 hours. CBG: No results for input(s): GLUCAP in the last 168 hours. Lipid Profile: No results for input(s): CHOL, HDL, LDLCALC, TRIG, CHOLHDL, LDLDIRECT in the last 72 hours. Thyroid Function Tests: No results for input(s): TSH, T4TOTAL, FREET4, T3FREE, THYROIDAB in the last 72 hours. Anemia Panel: No results for input(s): VITAMINB12, FOLATE, FERRITIN, TIBC, IRON, RETICCTPCT in the last 72 hours. Urine  analysis:    Component Value Date/Time   COLORURINE YELLOW 03/12/2021 2345   APPEARANCEUR HAZY (A) 03/30/2021 2345   LABSPEC 1.018 03/31/2021 2345   PHURINE 6.0 03/14/2021 2345   GLUCOSEU 50 (A) 03/26/2021 2345   HGBUR NEGATIVE 04/03/2021 2345   BILIRUBINUR NEGATIVE 03/30/2021 2345   KETONESUR 5 (A) 03/29/2021 2345   PROTEINUR 30 (A) 03/29/2021 2345   UROBILINOGEN 0.2 11/04/2014 1239   NITRITE NEGATIVE 03/11/2021 2345   LEUKOCYTESUR MODERATE (A) 03/16/2021 2345    Studies: DG CHEST PORT 1 VIEW  Result Date: 04/01/2021 CLINICAL DATA:  Follow-up pneumothorax EXAM: PORTABLE CHEST 1 VIEW COMPARISON:  03/31/2021, 03/30/2021, 03/29/2021 FINDINGS: Right-sided chest tube remains in place. Small right apicolateral and basilar pneumothorax, stable to minimally increased in size. Emphysema. Patchy atelectasis or scarring at the bases. Normal cardiac size. IMPRESSION: 1. Stable to minimal increase in size of residual small right apical and basilar pneumothorax. 2. Emphysema with scarring or atelectasis at the bases Electronically Signed   By: Donavan Foil M.D.   On: 04/01/2021 03:56    Scheduled Meds:  busPIRone  5 mg Oral TID   chlorhexidine  15 mL Mouth Rinse BID   enoxaparin (LOVENOX) injection  30 mg Subcutaneous Q24H   feeding supplement  237 mL Oral QID   mouth rinse  15 mL Mouth Rinse q12n4p   mirtazapine  7.5 mg Oral QHS  mometasone-formoterol  2 puff Inhalation BID   tamsulosin  0.4 mg Oral Daily   umeclidinium bromide  1 puff Inhalation Daily     LOS: 13 days   Little Ishikawa, DO [facility]  04/01/2021, 7:57 AM

## 2021-04-01 NOTE — Progress Notes (Addendum)
      [address]       [place] [address]             [phone]       Subjective:  Patient with no complaints this morning.  Pain/shortness of breath controlled.  Rough day yesterday.  Objective: Vital signs in last 24 hours: Temp:  [97.6 F (36.4 C)-98.6 F (37 C)] 98.6 F (37 C) (07/22 0323) Pulse Rate:  [84-109] 84 (07/22 0323) Cardiac Rhythm: Normal sinus rhythm (07/22 0702) Resp:  [17-24] 19 (07/22 0323) BP: (107-198)/(53-77) 123/60 (07/22 0323) SpO2:  [93 %-100 %] 100 % (07/22 0323) Weight:  [45.9 kg] 45.9 kg (07/21 1335)  Intake/Output from previous day: 07/21 0701 - 07/22 0700 In: 400 [P.O.:300] Out: 550 [Urine:550]  General appearance: alert, cooperative, and no distress Heart: regular rate and rhythm Lungs: absent breath sounds right apex, otherwise CTA Wound: clean and dry  Lab Results: Recent Labs    03/31/21 0659  WBC 11.2*  HGB 8.7*  HCT 27.9*  PLT 416*   BMET:  Recent Labs    03/31/21 0659  NA 143  K 5.2*  CL 102  CO2 34*  GLUCOSE 107*  BUN 47*  CREATININE 0.88  CALCIUM 8.8*    PT/INR: No results for input(s): LABPROT, INR in the last 72 hours. ABG    Component Value Date/Time   PHART 7.220 (L) 03/25/2021 1633   HCO3 26.7 03/25/2021 1633   TCO2 25.7 10/30/2014 0500   O2SAT 91.3 03/14/2021 1633   CBG (last 3)  No results for input(s): GLUCAP in the last 72 hours.  Assessment/Plan:  Pneumothorax- chest tube was placed to water seal on 7/20.Marland Kitchen air leak persists... CXR this morning shows increase in apical pneumothorax and new basilar component present, patient is tolerating,  leave on water seal for now, will discuss further management with Dr. Roxan Hockey Anxiety/panic attack- events of yesterday noted, palliative care managing Dispo- care per primary, discussed CXR with Dr. Roxan Hockey this morning, will place chest tube back on suction   LOS: 13 days   Ellwood Handler, PA-C 04/01/2021   Chart reviewed,  patient examined, agree with above. Still has air leak and mildly increased ptx on CXR this am. CT put back to suction.

## 2021-04-01 NOTE — Progress Notes (Signed)
Physical Therapy Treatment Patient Details Name: Alan Huff MRN: [id number] DOB: 10/06/1945 Today's Date: 04/01/2021    History of Present Illness Pt is a 75 y.o. M who presents with large right sided PTX and had chest tube placed. Significant PMH: COPD on 2-4L, prior history of PTX, cognitive impairment.    PT Comments    Pt seen for progression of OOB mobility this morning and initially agreeable. However, after bed-level exercises, the pt reports he [place] unable to get breathing rate under control and declined all further attempts at EOB or OOB mobility. The pt's vital signs remained stable (SpO2 100% and RR 14-18 on 4L O2), but the pt was unable to be redirected or convinced to continue with activity. Educated on importance of OOB and upright positioning, left in chair position in bed with HOB elevated to 55 deg. Will continue to benefit from use of lift to get OOB to chair, and continued attempts to maximize activity and mobility.     Follow Up Recommendations  SNF     Equipment Recommendations  None recommended by PT    Recommendations for Other Services       Precautions / Restrictions Precautions Precautions: Fall;Other (comment) Precaution Comments: chest tube, low vision (legally blind), very HOH Restrictions Weight Bearing Restrictions: No    Mobility  Bed Mobility Overal bed mobility: Needs Assistance Bed Mobility: Rolling           General bed mobility comments: pt declined transition to sitting EOB due to reported inability to slow his breathing however, VSS (SpO2 100% and RR down to 14). pt able to assist with LE for repositioning in bed. tolerating chair position with HOB to 55 deg    Transfers                 General transfer comment: pt declined due to concerns for breathing   Balance Overall balance assessment: Needs assistance Sitting-balance support: Feet unsupported   Sitting balance - Comments: unable to pull trunk away from posterior  support today due to pain and concern for breathing                                    Cognition Arousal/Alertness: Awake/alert Behavior During Therapy: Flat affect Overall Cognitive Status: History of cognitive impairments - at baseline                                 General Comments: able to follow 1-step commands, repeatedly stating "that's what they say but they aren't right" when told VSS, self-limiting due to reported inability to slow his breathing but pt SpO2 at 100% with RR of 14. unable to be redirected or re-educated.      Exercises General Exercises - Lower Extremity Ankle Circles/Pumps: Both;20 reps;Seated Short Arc Quad: AROM;Both;15 reps;Supine Heel Slides: AROM;Both;15 reps;Supine Other Exercises Other Exercises: resisted LE extension x15 each LE in supine Other Exercises: bridges x10    General Comments General comments (skin integrity, edema, etc.): VSS on 4L, RR 14 but pt reporting inability to catch his breath and declined all further mobility      Pertinent Vitals/Pain Pain Assessment: Faces Faces Pain Scale: Hurts little more Pain Location: discomfort with abdominal flexion, at chest tube site. unable to tolerate laying flat Pain Descriptors / Indicators: Grimacing Pain Intervention(s): Monitored during session;Repositioned     PT  Goals (current goals can now be found in the care plan section) Acute Rehab PT Goals Patient Stated Goal: to slow his breathing PT Goal Formulation: With patient Time For Goal Achievement: 04/10/21 Potential to Achieve Goals: Fair Progress towards PT goals: Progressing toward goals    Frequency    Min 2X/week      PT Plan Current plan remains appropriate       AM-PAC PT "6 Clicks" Mobility   Outcome Measure  Help needed turning from your back to your side while in a flat bed without using bedrails?: A Little Help needed moving from lying on your back to sitting on the side of a flat  bed without using bedrails?: A Lot Help needed moving to and from a bed to a chair (including a wheelchair)?: Total Help needed standing up from a chair using your arms (e.g., wheelchair or bedside chair)?: Total Help needed to walk in hospital room?: Total Help needed climbing 3-5 steps with a railing? : Total 6 Click Score: 9    End of Session Equipment Utilized During Treatment: Oxygen Activity Tolerance: Other (comment) (pt reports difficulty slowing breathing (RR 14, SpO2 100%)) Patient left: in bed;with bed alarm set;with call bell/phone within reach (in chair position, 55 deg) Nurse Communication: Mobility status PT Visit Diagnosis: Muscle weakness (generalized) (M62.81);Other abnormalities of gait and mobility (R26.89)     Time: 1761-6073 PT Time Calculation (min) (ACUTE ONLY): 27 min  Charges:  $Therapeutic Exercise: 23-37 mins                     Inocencio Homes, PT, DPT   Acute Rehabilitation Department Pager #: [phone]   Otho Bellows 04/01/2021, 9:18 AM

## 2021-04-02 ENCOUNTER — Inpatient Hospital Stay (HOSPITAL_COMMUNITY): Payer: No Typology Code available for payment source

## 2021-04-02 DIAGNOSIS — J939 Pneumothorax, unspecified: Secondary | ICD-10-CM | POA: Diagnosis not present

## 2021-04-02 DIAGNOSIS — J42 Unspecified chronic bronchitis: Secondary | ICD-10-CM | POA: Diagnosis not present

## 2021-04-02 DIAGNOSIS — J9611 Chronic respiratory failure with hypoxia: Secondary | ICD-10-CM | POA: Diagnosis not present

## 2021-04-02 DIAGNOSIS — E43 Unspecified severe protein-calorie malnutrition: Secondary | ICD-10-CM | POA: Diagnosis not present

## 2021-04-02 LAB — CBC
HCT: 28.9 % — ABNORMAL LOW (ref 39.0–52.0)
Hemoglobin: 9.1 g/dL — ABNORMAL LOW (ref 13.0–17.0)
MCH: 31.2 pg (ref 26.0–34.0)
MCHC: 31.5 g/dL (ref 30.0–36.0)
MCV: 99 fL (ref 80.0–100.0)
Platelets: 425 10*3/uL — ABNORMAL HIGH (ref 150–400)
RBC: 2.92 MIL/uL — ABNORMAL LOW (ref 4.22–5.81)
RDW: 15.4 % (ref 11.5–15.5)
WBC: 12 10*3/uL — ABNORMAL HIGH (ref 4.0–10.5)
nRBC: 0 % (ref 0.0–0.2)

## 2021-04-02 LAB — BASIC METABOLIC PANEL
Anion gap: 5 (ref 5–15)
BUN: 37 mg/dL — ABNORMAL HIGH (ref 8–23)
CO2: 33 mmol/L — ABNORMAL HIGH (ref 22–32)
Calcium: 8.7 mg/dL — ABNORMAL LOW (ref 8.9–10.3)
Chloride: 100 mmol/L (ref 98–111)
Creatinine, Ser: 0.84 mg/dL (ref 0.61–1.24)
GFR, Estimated: >60 mL/min (ref >60)
Glucose, Bld: 99 mg/dL (ref 70–99)
Potassium: 4.5 mmol/L (ref 3.5–5.1)
Sodium: 138 mmol/L (ref 135–145)

## 2021-04-02 NOTE — Progress Notes (Signed)
PROGRESS NOTE  Gordon Vandunk EGB:[id number] DOB: 01-Jul-1946 DOA: 03/28/2021 PCP: [facility]  HPI/Recap of past 24 hours: This is a 75 year old male with COPD, chronic respiratory failure on 3 L/min to 4 L/min home O2 history of pneumothorax, history of lung blebs, bronchial alveolar fistula status post pleurodesis.  He presented with respiratory distress and chest discomfort chest x-ray in the ER showed right chest pneumothorax.  Chest tube was inserted and his symptom has improved.  March 26, 2021: Patient seen and examined at bedside he stated he is feeling fine no new complaint  March 27, 2021: Patient seen and examined at bedside he denies any new complain.  He still has his chest tube which is being managed by vascular cardiothoracic  April 02, 2021 Patient seen and examined at bedside.  Denies any pain or any concerns.  Patient had increased pain yesterday and a repeat chest x-ray was done His chest x-ray yesterday showed worsening pneumothorax and his chest tube was replaced back on suction.  His symptom has improved  Assessment/Plan: Active Problems:   Lung blebs (HCC)   Persistent cognitive impairment   Pneumothorax, right   COPD (chronic obstructive pulmonary disease) (HCC)   Chronic respiratory failure with hypoxia (HCC)   Protein-calorie malnutrition, severe  1.  Acute on chronic hypoxic respiratory failure due to pneumothorax Right pneumothorax Chest tube in place and seal is good cardiothoracic surgery is following Palliative consult following  2.  COPD.  Stable Continue inhalers ICS/LABA/LAMA  3.  Anemia No active bleeding Will monitor hemoglobin Will transfuse if hemoglobin drops to 7  4.  Mood disorder continue mirtazapine  5.  BPH continue Flomax  6.  Severe protein calorie malnutrition Continue supplemental feeding  Code Status: DNR  Severity of Illness: The appropriate patient status for this patient is INPATIENT. Inpatient status is judged  to be reasonable and necessary in order to provide the required intensity of service to ensure the patient's safety. The patient's presenting symptoms, physical exam findings, and initial radiographic and laboratory data in the context of their chronic comorbidities is felt to place them at high risk for further clinical deterioration. Furthermore, it is not anticipated that the patient will be medically stable for discharge from the hospital within 2 midnights of admission. The following factors support the patient status of inpatient.   " Has a chest tube that is being monitored   * I certify that at the point of admission it is my clinical judgment that the patient will require inpatient hospital care spanning beyond 2 midnights from the point of admission due to high intensity of service, high risk for further deterioration and high frequency of surveillance required.*   Family Communication: None at bedside  Disposition Plan: To be determined.  Physical therapy is recommending SNF.  May require LTAC due to ongoing need for chest tube placement consider discharge with mini express Difficulty placing as SNF does not appear to be willing to accept patient back with his device.  consultants: Cardiothoracic Palliative care  Procedures: Thoracentesis  Antimicrobials: None  DVT prophylaxis: Lovenox   Objective: Vitals:   04/02/21 0357 04/02/21 0838 04/02/21 1102 04/02/21 1502  BP: 118/78  119/63 116/90  Pulse: 72  86 100  Resp: 20 20  (!) 24  Temp: 97.8 F (36.6 C)  98 F (36.7 C) 98.1 F (36.7 C)  TempSrc: Oral  Oral Oral  SpO2: 100%  100% 100%  Weight:      Height:  Intake/Output Summary (Last 24 hours) at 04/02/2021 1953 Last data filed at 04/02/2021 1800 Gross per 24 hour  Intake --  Output 710 ml  Net -710 ml    Filed Weights   03/11/2021 2331 03/21/21 0400 03/31/21 1335  Weight: 43.5 kg 45.9 kg 45.9 kg   Body mass index is 15.85 kg/m.  Exam:  General:  75 y.o. year-old male well developed well nourished in no acute distress.  Alert and oriented x3.  Chronically ill looking Cardiovascular: Regular rate and rhythm with no rubs or gallops.  No thyromegaly or JVD noted.   Respiratory: Clear to auscultation with no wheezes or rales. Good inspiratory effort. Abdomen: Soft nontender nondistended with normal bowel sounds x4 quadrants. Musculoskeletal: No lower extremity edema. 2/4 pulses in all 4 extremities. Skin: No ulcerative lesions noted or rashes, Psychiatry: Mood is appropriate for condition and setting    Data Reviewed: CBC: Recent Labs  Lab 03/29/21 0547 03/31/21 0659 04/02/21 0634  WBC 10.2 11.2* 12.0*  HGB 8.9* 8.7* 9.1*  HCT 28.8* 27.9* 28.9*  MCV 98.6 98.2 99.0  PLT 421* 416* 425*    Basic Metabolic Panel: Recent Labs  Lab 03/29/21 0547 03/31/21 0659 04/02/21 0634  NA 140 143 138  K 4.4 5.2* 4.5  CL 100 102 100  CO2 34* 34* 33*  GLUCOSE 115* 107* 99  BUN 42* 47* 37*  CREATININE 0.84 0.88 0.84  CALCIUM 8.5* 8.8* 8.7*    GFR: Estimated Creatinine Clearance: 48.6 mL/min (by C-G formula based on SCr of 0.84 mg/dL). Liver Function Tests: No results for input(s): AST, ALT, ALKPHOS, BILITOT, PROT, ALBUMIN in the last 168 hours.  No results for input(s): LIPASE, AMYLASE in the last 168 hours. No results for input(s): AMMONIA in the last 168 hours. Coagulation Profile: No results for input(s): INR, PROTIME in the last 168 hours.  Cardiac Enzymes: No results for input(s): CKTOTAL, CKMB, CKMBINDEX, TROPONINI in the last 168 hours. BNP (last 3 results) No results for input(s): PROBNP in the last 8760 hours. HbA1C: No results for input(s): HGBA1C in the last 72 hours. CBG: No results for input(s): GLUCAP in the last 168 hours. Lipid Profile: No results for input(s): CHOL, HDL, LDLCALC, TRIG, CHOLHDL, LDLDIRECT in the last 72 hours. Thyroid Function Tests: No results for input(s): TSH, T4TOTAL, FREET4, T3FREE,  THYROIDAB in the last 72 hours. Anemia Panel: No results for input(s): VITAMINB12, FOLATE, FERRITIN, TIBC, IRON, RETICCTPCT in the last 72 hours. Urine analysis:    Component Value Date/Time   COLORURINE YELLOW 03/17/2021 2345   APPEARANCEUR HAZY (A) 04/06/2021 2345   LABSPEC 1.018 04/02/2021 2345   PHURINE 6.0 04/06/2021 2345   GLUCOSEU 50 (A) 04/09/2021 2345   HGBUR NEGATIVE 04/04/2021 2345   BILIRUBINUR NEGATIVE 03/20/2021 2345   KETONESUR 5 (A) 03/12/2021 2345   PROTEINUR 30 (A) 03/20/2021 2345   UROBILINOGEN 0.2 11/04/2014 1239   NITRITE NEGATIVE 03/27/2021 2345   LEUKOCYTESUR MODERATE (A) 04/04/2021 2345   Sepsis Labs: @LABRCNTIP (procalcitonin:4,lacticidven:4)  ) No results found for this or any previous visit (from the past 240 hour(s)).     Studies: DG CHEST PORT 1 VIEW  Result Date: 04/02/2021 CLINICAL DATA:  Shortness of breath. Evaluate right chest tube and pneumothorax. EXAM: PORTABLE CHEST 1 VIEW COMPARISON:  April 01, 2021 FINDINGS: The right chest tube is stable. The right-sided pneumothorax measures 11 mm in maximum thickness today versus 15 mm yesterday, improved. No left-sided pneumothorax. The heart, hila, and mediastinum are unremarkable. No pulmonary nodules or  masses are identified. Hyperinflation of the lungs remains consistent with history of emphysema. Mild opacity in the left base is probably atelectasis and perhaps a small associated effusion. No other abnormalities. IMPRESSION: 1. The right chest tube is stable. The right-sided pneumothorax is smaller in the interval. Recommend attention on follow-up. 2. Emphysematous changes in the lungs. 3. Mild opacity in the left base favored represent atelectasis, perhaps with a small associated effusion. Electronically Signed   By: Dorise Bullion III M.D   On: 04/02/2021 09:02    Scheduled Meds:  busPIRone  5 mg Oral TID   chlorhexidine  15 mL Mouth Rinse BID   enoxaparin (LOVENOX) injection  30 mg Subcutaneous  Q24H   feeding supplement  237 mL Oral QID   mouth rinse  15 mL Mouth Rinse q12n4p   mirtazapine  7.5 mg Oral QHS   mometasone-formoterol  2 puff Inhalation BID   tamsulosin  0.4 mg Oral Daily   umeclidinium bromide  1 puff Inhalation Daily    Continuous Infusions:   LOS: 14 days     Cristal Deer, MD [facility]  To reach me or the doctor on call, go to: [email or web address] Password [facility]  04/02/2021, 7:53 PM

## 2021-04-02 NOTE — Progress Notes (Addendum)
      [address]       [place] [address]             [phone]         Subjective: Feels okay this morning, does not have any pain  Objective: Vital signs in last 24 hours: Temp:  [97.7 F (36.5 C)-98.2 F (36.8 C)] 97.8 F (36.6 C) (07/23 0357) Pulse Rate:  [72-93] 72 (07/23 0357) Cardiac Rhythm: Normal sinus rhythm (07/23 0700) Resp:  [19-23] 20 (07/23 0357) BP: (102-126)/(49-78) 118/78 (07/23 0357) SpO2:  [98 %-100 %] 100 % (07/23 0357)     Intake/Output from previous day: 07/22 0701 - 07/23 0700 In: -  Out: 1350 [Urine:1300; Chest Tube:50] Intake/Output this shift: No intake/output data recorded.  Cor: RRR, no murmur Pulm: CTA bilaterally and in all fields Abd: no tenderness Ext: no edema Wound: chest tube well secured  Lab Results: Recent Labs    03/31/21 0659 04/02/21 0634  WBC 11.2* 12.0*  HGB 8.7* 9.1*  HCT 27.9* 28.9*  PLT 416* 425*   BMET:  Recent Labs    03/31/21 0659 04/02/21 0634  NA 143 138  K 5.2* 4.5  CL 102 100  CO2 34* 33*  GLUCOSE 107* 99  BUN 47* 37*  CREATININE 0.88 0.84  CALCIUM 8.8* 8.7*    PT/INR: No results for input(s): LABPROT, INR in the last 72 hours. ABG    Component Value Date/Time   PHART 7.220 (L) 03/12/2021 1633   HCO3 26.7 04/04/2021 1633   TCO2 25.7 10/30/2014 0500   O2SAT 91.3 03/12/2021 1633   CBG (last 3)  No results for input(s): GLUCAP in the last 72 hours.  Assessment/Plan:   Pneumothorax- chest tube was placed to water seal on 7/20.Marland Kitchen air leak persists... CXR this morning shows improvement in pneumothorax. Keep to suction.  Anxiety/panic attack- events of yesterday noted, palliative care managing  Plan: Keep to suction today CXR in the morning. Pain has been well controlled.    LOS: 14 days    Elgie Collard 04/02/2021   Chart reviewed, patient examined, agree with above. Still has air leak. CXR improved but still has small right ptx. Will keep to suction for now.

## 2021-04-03 ENCOUNTER — Inpatient Hospital Stay (HOSPITAL_COMMUNITY): Payer: No Typology Code available for payment source

## 2021-04-03 DIAGNOSIS — J441 Chronic obstructive pulmonary disease with (acute) exacerbation: Secondary | ICD-10-CM

## 2021-04-03 DIAGNOSIS — J9611 Chronic respiratory failure with hypoxia: Secondary | ICD-10-CM | POA: Diagnosis not present

## 2021-04-03 DIAGNOSIS — E43 Unspecified severe protein-calorie malnutrition: Secondary | ICD-10-CM | POA: Diagnosis not present

## 2021-04-03 DIAGNOSIS — J939 Pneumothorax, unspecified: Secondary | ICD-10-CM | POA: Diagnosis not present

## 2021-04-03 NOTE — Progress Notes (Addendum)
      [address]       [place] [address]             [phone]         Subjective: A little more confused this morning, says he had a sandwich, a pizza, and spaghetti with meat balls for lunch and he was stuffed. Also asking for a breathing treatment  Objective: Vital signs in last 24 hours: Temp:  [97.7 F (36.5 C)-98.4 F (36.9 C)] 98.4 F (36.9 C) (07/24 0328) Pulse Rate:  [86-100] 87 (07/24 0328) Cardiac Rhythm: Bundle branch block;Normal sinus rhythm (07/24 0700) Resp:  [19-24] 19 (07/24 0328) BP: (116-135)/(63-90) 126/71 (07/24 0328) SpO2:  [99 %-100 %] 100 % (07/24 0328)     Intake/Output from previous day: 07/23 0701 - 07/24 0700 In: -  Out: 780 [Urine:750; Chest Tube:30] Intake/Output this shift: No intake/output data recorded.  General appearance: alert, cooperative, and no distress Heart: regular rate and rhythm, S1, S2 normal, no murmur, click, rub or gallop Lungs: clear to auscultation bilaterally Abdomen: soft, non-tender; bowel sounds normal; no masses,  no organomegaly Extremities: extremities normal, atraumatic, no cyanosis or edema Wound: dry around the chest tube  Lab Results: Recent Labs    04/02/21 0634  WBC 12.0*  HGB 9.1*  HCT 28.9*  PLT 425*   BMET:  Recent Labs    04/02/21 0634  NA 138  K 4.5  CL 100  CO2 33*  GLUCOSE 99  BUN 37*  CREATININE 0.84  CALCIUM 8.7*    PT/INR: No results for input(s): LABPROT, INR in the last 72 hours. ABG    Component Value Date/Time   PHART 7.220 (L) 04/09/2021 1633   HCO3 26.7 03/22/2021 1633   TCO2 25.7 10/30/2014 0500   O2SAT 91.3 04/04/2021 1633   CBG (last 3)  No results for input(s): GLUCAP in the last 72 hours.  Assessment/Plan:  Pneumothorax- chest tube was placed to water seal on 7/20.Marland Kitchen air leak persists... CXR this morning shows a persistent small pneumo Keep to suction.  Anxiety/panic attack- palliative care managing Oxygen saturation is 100% on 4L.    Plan: Keep to suction today CXR in the morning. Pain has been well controlled. Duo Neb being administered for shortness of breath.    LOS: 15 days    Elgie Collard 04/03/2021   Chart reviewed, patient examined, agree with above. CXR stable with small ptx. Still has air leak. Keep to suction today and possibly try water seal again tomorrow.

## 2021-04-03 NOTE — Progress Notes (Signed)
PROGRESS NOTE  Alan Huff NLZ:[id number] DOB: 15-Sep-1945 DOA: 03/21/2021 PCP: [facility]  HPI/Recap of past 24 hours: This is a 75 year old male with COPD, chronic respiratory failure on 3 L/min to 4 L/min home O2 history of pneumothorax, history of lung blebs, bronchial alveolar fistula status post pleurodesis.  He presented with respiratory distress and chest discomfort chest x-ray in the ER showed right chest pneumothorax.  Chest tube was inserted and his symptom has improved.  March 26, 2021: Patient seen and examined at bedside he stated he is feeling fine no new complaint  March 27, 2021: Patient seen and examined at bedside he denies any new complain.  He still has his chest tube which is being managed by vascular cardiothoracic  April 02, 2021 Patient seen and examined at bedside.  Denies any pain or any concerns.  Patient had increased pain yesterday and a repeat chest x-ray was done His chest x-ray yesterday showed worsening pneumothorax and his chest tube was replaced back on suction.  His symptom has improved  April 03, 2021: Patient seen and examined at bedside denies any complaints of no shortness of breath nurse reported he is a little confused this morning  Assessment/Plan: Active Problems:   Lung blebs ([place])   Persistent cognitive impairment   Pneumothorax, right   COPD (chronic obstructive pulmonary disease) ([place])   Chronic respiratory failure with hypoxia ([facility])   Protein-calorie malnutrition, severe  1.  Acute on chronic hypoxic respiratory failure due to pneumothorax Right pneumothorax Chest tube in place and patient has an air leak per cardiothoracic and the plan on keeping him on suction and will probably try water seal tomorrow Repeat chest x-ray tomorrow cardiothoracic surgery is following Palliative consult following  2.  COPD.  Stable Continue inhalers ICS/LABA/LAMA Patient is saturating 100% on 4 L/min  3.  Anemia No active bleeding Will  monitor hemoglobin Will transfuse if hemoglobin drops to 7  4.  Mood disorder continue mirtazapine  5.  BPH continue Flomax  6.  Severe protein calorie malnutrition Continue supplemental feeding  Code Status: DNR  Severity of Illness: The appropriate patient status for this patient is INPATIENT. Inpatient status is judged to be reasonable and necessary in order to provide the required intensity of service to ensure the patient's safety. The patient's presenting symptoms, physical exam findings, and initial radiographic and laboratory data in the context of their chronic comorbidities is felt to place them at high risk for further clinical deterioration. Furthermore, it is not anticipated that the patient will be medically stable for discharge from the hospital within 2 midnights of admission. The following factors support the patient status of inpatient.   " Has a chest tube that is being monitored   * I certify that at the point of admission it is my clinical judgment that the patient will require inpatient hospital care spanning beyond 2 midnights from the point of admission due to high intensity of service, high risk for further deterioration and high frequency of surveillance required.*   Family Communication: None at bedside  Disposition Plan: To be determined.  Physical therapy is recommending SNF.  May require LTAC due to ongoing need for chest tube placement consider discharge with mini express Difficulty placing as SNF does not appear to be willing to accept patient back with his device.  consultants: Cardiothoracic Palliative care  Procedures: Thoracentesis  Antimicrobials: None  DVT prophylaxis: Lovenox   Objective: Vitals:   04/02/21 1953 04/02/21 2322 04/03/21 0328 04/03/21 0900  BP: 129/70 135/67 126/71 (!) 138/98  Pulse: 93 91 87 97  Resp: (!) 23 (!) 21 19   Temp: 97.7 F (36.5 C) 98.4 F (36.9 C) 98.4 F (36.9 C) 98.5 F (36.9 C)  TempSrc: Oral Oral  Oral Oral  SpO2: 100% 99% 100% 100%  Weight:      Height:        Intake/Output Summary (Last 24 hours) at 04/03/2021 1243 Last data filed at 04/03/2021 0918 Gross per 24 hour  Intake --  Output 930 ml  Net -930 ml    Filed Weights   04/04/2021 2331 03/21/21 0400 03/31/21 1335  Weight: 43.5 kg 45.9 kg 45.9 kg   Body mass index is 15.85 kg/m.  Exam:  General: 75 y.o. year-old male well developed well nourished in no acute distress.  Alert and oriented x3.  Chronically ill looking Cardiovascular: Regular rate and rhythm with no rubs or gallops.  No thyromegaly or JVD noted.   Respiratory: Clear to auscultation with no wheezes or rales. Good inspiratory effort. Abdomen: Soft nontender nondistended with normal bowel sounds x4 quadrants. Musculoskeletal: No lower extremity edema. 2/4 pulses in all 4 extremities. Skin: No ulcerative lesions noted or rashes, Psychiatry: Mood is appropriate for condition and setting    Data Reviewed: CBC: Recent Labs  Lab 03/29/21 0547 03/31/21 0659 04/02/21 0634  WBC 10.2 11.2* 12.0*  HGB 8.9* 8.7* 9.1*  HCT 28.8* 27.9* 28.9*  MCV 98.6 98.2 99.0  PLT 421* 416* 425*    Basic Metabolic Panel: Recent Labs  Lab 03/29/21 0547 03/31/21 0659 04/02/21 0634  NA 140 143 138  K 4.4 5.2* 4.5  CL 100 102 100  CO2 34* 34* 33*  GLUCOSE 115* 107* 99  BUN 42* 47* 37*  CREATININE 0.84 0.88 0.84  CALCIUM 8.5* 8.8* 8.7*    GFR: Estimated Creatinine Clearance: 48.6 mL/min (by C-G formula based on SCr of 0.84 mg/dL). Liver Function Tests: No results for input(s): AST, ALT, ALKPHOS, BILITOT, PROT, ALBUMIN in the last 168 hours.  No results for input(s): LIPASE, AMYLASE in the last 168 hours. No results for input(s): AMMONIA in the last 168 hours. Coagulation Profile: No results for input(s): INR, PROTIME in the last 168 hours.  Cardiac Enzymes: No results for input(s): CKTOTAL, CKMB, CKMBINDEX, TROPONINI in the last 168 hours. BNP (last 3  results) No results for input(s): PROBNP in the last 8760 hours. HbA1C: No results for input(s): HGBA1C in the last 72 hours. CBG: No results for input(s): GLUCAP in the last 168 hours. Lipid Profile: No results for input(s): CHOL, HDL, LDLCALC, TRIG, CHOLHDL, LDLDIRECT in the last 72 hours. Thyroid Function Tests: No results for input(s): TSH, T4TOTAL, FREET4, T3FREE, THYROIDAB in the last 72 hours. Anemia Panel: No results for input(s): VITAMINB12, FOLATE, FERRITIN, TIBC, IRON, RETICCTPCT in the last 72 hours. Urine analysis:    Component Value Date/Time   COLORURINE YELLOW 04/07/2021 2345   APPEARANCEUR HAZY (A) 03/25/2021 2345   LABSPEC 1.018 04/04/2021 2345   PHURINE 6.0 03/22/2021 2345   GLUCOSEU 50 (A) 03/28/2021 2345   HGBUR NEGATIVE 03/21/2021 2345   BILIRUBINUR NEGATIVE 03/17/2021 2345   KETONESUR 5 (A) 04/02/2021 2345   PROTEINUR 30 (A) 03/16/2021 2345   UROBILINOGEN 0.2 11/04/2014 1239   NITRITE NEGATIVE 03/21/2021 2345   LEUKOCYTESUR MODERATE (A) 03/21/2021 2345   Sepsis Labs: @LABRCNTIP (procalcitonin:4,lacticidven:4)  ) No results found for this or any previous visit (from the past 240 hour(s)).     Studies: DG Chest  Port 1 View  Result Date: 04/03/2021 CLINICAL DATA:  Shortness of breath.  Pneumothorax. EXAM: PORTABLE CHEST 1 VIEW COMPARISON:  Lungs are hyperexpanded. Right pleural drain 04/02/2021 FINDINGS: Remains in place with stable persistent tiny right-sided pneumothorax. Interstitial markings are diffusely coarsened with chronic features. Stable scarring at the left base. The cardiopericardial silhouette is within normal limits for size. Bones are diffusely demineralized. Telemetry leads overlie the chest. IMPRESSION: No interval change. Persistent tiny right pneumothorax. Electronically Signed   By: Misty Stanley M.D.   On: 04/03/2021 09:01    Scheduled Meds:  busPIRone  5 mg Oral TID   chlorhexidine  15 mL Mouth Rinse BID   enoxaparin (LOVENOX)  injection  30 mg Subcutaneous Q24H   feeding supplement  237 mL Oral QID   mouth rinse  15 mL Mouth Rinse q12n4p   mirtazapine  7.5 mg Oral QHS   mometasone-formoterol  2 puff Inhalation BID   tamsulosin  0.4 mg Oral Daily   umeclidinium bromide  1 puff Inhalation Daily    Continuous Infusions:   LOS: 15 days     Cristal Deer, MD [facility]  To reach me or the doctor on call, go to: [email or web address] Password [facility]  04/03/2021, 12:43 PM

## 2021-04-04 ENCOUNTER — Inpatient Hospital Stay (HOSPITAL_COMMUNITY): Payer: No Typology Code available for payment source

## 2021-04-04 DIAGNOSIS — R0603 Acute respiratory distress: Secondary | ICD-10-CM | POA: Diagnosis not present

## 2021-04-04 DIAGNOSIS — Z515 Encounter for palliative care: Secondary | ICD-10-CM | POA: Diagnosis not present

## 2021-04-04 DIAGNOSIS — Z7189 Other specified counseling: Secondary | ICD-10-CM | POA: Diagnosis not present

## 2021-04-04 DIAGNOSIS — J939 Pneumothorax, unspecified: Secondary | ICD-10-CM | POA: Diagnosis not present

## 2021-04-04 DIAGNOSIS — J95812 Postprocedural air leak: Secondary | ICD-10-CM

## 2021-04-04 MED ORDER — ONDANSETRON 4 MG PO TBDP: 4.0000 mg | ORAL_TABLET | Freq: Four times a day (QID) | ORAL | Status: DC | PRN

## 2021-04-04 MED ORDER — LIDOCAINE HCL (PF) 1 % IJ SOLN
30.0000 mL | Freq: Once | INTRAMUSCULAR | Status: DC
  Filled 2021-04-04: qty 30

## 2021-04-04 MED ORDER — GLYCOPYRROLATE 0.2 MG/ML IJ SOLN: 0.1000 mg | Freq: Four times a day (QID) | INTRAMUSCULAR | Status: DC

## 2021-04-04 MED ORDER — GLYCOPYRROLATE 0.2 MG/ML IJ SOLN
0.1000 mg | Freq: Three times a day (TID) | INTRAMUSCULAR | Status: DC
  Administered 2021-04-04: 0.1 mg via INTRAVENOUS
  Filled 2021-04-04: qty 1

## 2021-04-04 MED ORDER — MORPHINE 100MG IN NS 100ML (1MG/ML) PREMIX INFUSION
4.0000 mg/h | INTRAVENOUS | Status: DC
  Administered 2021-04-04 – 2021-04-05 (×2): 4 mg/h via INTRAVENOUS
  Filled 2021-04-04 (×2): qty 100

## 2021-04-04 MED ORDER — BIOTENE DRY MOUTH MT LIQD: 15.0000 mL | OROMUCOSAL | Status: DC | PRN

## 2021-04-04 MED ORDER — MORPHINE SULFATE (PF) 2 MG/ML IV SOLN
1.0000 mg | INTRAVENOUS | Status: DC | PRN
Start: 2021-04-04 — End: 2021-04-04
  Administered 2021-04-04: 1 mg via INTRAVENOUS
  Filled 2021-04-04: qty 1

## 2021-04-04 MED ORDER — ONDANSETRON HCL 4 MG/2ML IJ SOLN: 4.0000 mg | Freq: Four times a day (QID) | INTRAMUSCULAR | Status: DC | PRN

## 2021-04-04 MED ORDER — MORPHINE BOLUS VIA INFUSION
4.0000 mg | INTRAVENOUS | Status: DC | PRN
  Administered 2021-04-04 – 2021-04-05 (×2): 4 mg via INTRAVENOUS
  Filled 2021-04-04: qty 4

## 2021-04-04 MED ORDER — LORAZEPAM 2 MG/ML IJ SOLN: 1.0000 mg | Freq: Four times a day (QID) | INTRAMUSCULAR | Status: DC | PRN

## 2021-04-04 MED ORDER — POLYVINYL ALCOHOL 1.4 % OP SOLN
1.0000 [drp] | Freq: Four times a day (QID) | OPHTHALMIC | Status: DC | PRN
  Filled 2021-04-04: qty 15

## 2021-04-04 MED ORDER — HALOPERIDOL LACTATE 5 MG/ML IJ SOLN
2.0000 mg | Freq: Four times a day (QID) | INTRAMUSCULAR | Status: DC | PRN
  Administered 2021-04-05: 2 mg via INTRAVENOUS
  Filled 2021-04-04: qty 1

## 2021-04-04 MED ORDER — MORPHINE SULFATE (PF) 2 MG/ML IV SOLN
1.0000 mg | Freq: Once | INTRAVENOUS | Status: AC | PRN
  Administered 2021-04-04: 1 mg via INTRAVENOUS
  Filled 2021-04-04: qty 1

## 2021-04-04 MED ORDER — GLYCOPYRROLATE 0.2 MG/ML IJ SOLN
0.4000 mg | INTRAMUSCULAR | Status: DC
  Administered 2021-04-04 – 2021-04-05 (×5): 0.4 mg via INTRAVENOUS
  Filled 2021-04-04 (×6): qty 2

## 2021-04-04 NOTE — Progress Notes (Signed)
Palliative Care has been called to come assess pt and put orders in for pt comfort.  Family at bedside.

## 2021-04-04 NOTE — Progress Notes (Signed)
Pt bed alarm went off, when staff arrived in room, pt confused and had pulled leads off. Pt chest tube was found pulled out and in the bed. Pt appeared more labored with respirations than earlier. CN and RRT RN called. Dressing placed over CT site. O2 99-100%.  Paged Kennon Holter, [facility] NP paged.  Dr. Cyndia Bent, CVTS paged and ordered CXR.  Pt became irritable. Pt breathing became more labored, albuterol inhaler and morphine 2.5 mg given.  Dr Cyndia Bent notified of CXR results. Per Cyndia Bent, dress CT insertion site for now and recheck CXR at 0700.  Pt appears calmer now and has verbalized wanting to sleep. Will continue to monitor pt  closely.

## 2021-04-04 NOTE — Progress Notes (Signed)
Palliative Medicine RN Note: Chart reviewed. Spoke w RN Lu Duffel. He reports they just rec'd an order for morphine IV and are waiting for it to be available in the Pyxis.  Lu Duffel also states family wants pt to "be on palliative care." Further questioning reveals family is at bedside and wants pt transitioned to comfort care. I let him know that PMT team rounds are about to start, and I will let the team know.  Marjie Skiff Hakeen Shipes, RN, BSN, [facility] Palliative Medicine Team 04/04/2021 7:58 AM Office [phone]

## 2021-04-04 NOTE — Significant Event (Addendum)
Rapid Response Event Note   Reason for Call :  Pt pulled out R chest tube  Initial Focused Assessment:  Pt lying in bed, confused. He is tachypneic with mildly labored respirations. His lungs are diminished R>L. Skin warm and dry.  No SQ air felt at chest tube site. Dressing placed to site by bedside RN.   T-98.2, HR-88, BP-126/69, RR-22, SpO2-99% on 4L [place].   Interventions:  PCXR  Plan of Care:  Await PCXR results and relay to MD. Continue to monitor pt closely. Call RRT if further assistance needed.   Event Summary:   MD Notified: Kennon Holter [facility] NP, Dr. Cyndia Bent, CVTS Call [place], Pritesh Sobecki Anderson, RN    Update: 0100-PCXR-Persistent right apical pneumothorax, similar to slightly increased in size from prior. Dr. Cyndia Bent notified. Orders for PCXR at 0700.   Update: 0409-called back to room d/t increased distress and agitation. PCXR-worsening PTX. Pt tachypneic and labored. Unable to hear breath sounds on R side. [place], NP and Dr. Cyndia Bent notified. Will set-up for chest tube reinsertion upon CVTS arrival.   Update: 0520-Dtr has decided forgo chest tube insertion and pursue more of a comfort care path. Dr. Cyndia Bent notified. Will notify [facility] MD as well.

## 2021-04-04 NOTE — Progress Notes (Signed)
Overnight event  Notified by RN this morning that around midnight patient became increasingly more confused and pulled out his chest tube.  Night cross coverage NP was notified at that time and an x-ray was done which did not show significant change in the size of the right pneumothorax.  RN had notified Dr. Cyndia Bent at that time who recommended obtaining a repeat x-ray in the morning.  Per RN, this morning about 40 minutes ago patient started having increasing shortness of breath.  Satting 92-93% on 5 L, placed on nonrebreather for comfort.  Repeat chest x-ray was done which revealed increase in size of right pneumothorax, now 3 cm in thickness at the base where previously it was 1 cm.  Patient seen and examined at bedside.  Currently on nonrebreather and satting in the upper 90s.  Mildly tachypneic with respiratory rate in the low 20s.  Not tachycardic.  Diminished breath sounds at the right lung base, mild scattered wheezing.  He was just given a DuoNeb treatment prior to my arrival.  -I have notified Dr. Cyndia Bent.  He will see the patient in the morning and place another chest tube. -Continue supplemental oxygen and monitor very closely

## 2021-04-04 NOTE — Progress Notes (Signed)
Called to the bedside at 0341. Learned that patient pulled out chest tube hours ago and reported to me that patient is now in distress. On my arrival patient respirations are mildly labored, and counted at 20bpm. Patient states that he is SOB but it is getting better. Patient is speaking to me in unbroken sentences. Patient is confused. Patient is not diaphoretic and reports to me no chest pain.  Instructed RN to call RRT/Unit RT if clinical decompensation is evaluated. Patient is stable at this time.   HR: 86 RR: 20 SpO2: 97 on 4L  Shylah Dossantos L. Tamala Julian, BS, RRT-ACCS, RCP

## 2021-04-04 NOTE — Progress Notes (Signed)
PROGRESS NOTE    Alan Huff  LPF:[id number] DOB: 07/26/1946 DOA: 03/18/2021 PCP: [facility]   Chief Complain: Dyspnea  Brief Narrative: Patient is a 75 year old male with COPD, chronic respiratory failure on 3 L/min to 4 L/min home O2 history of pneumothorax, history of lung blebs, bronchial alveolar fistula status post pleurodesis.  He presented with respiratory distress and chest discomfort chest x-ray in the ER showed right chest pneumothorax.  Chest tube was inserted , cardiothoracic surgery was following.  He accidentally removed the chest tube in the early morning of 04/04/2021.  Because of ongoing respiratory distress and multiple comorbidities, family decided to initiate comfort care.  Palliative care has been consulted.  Started on morphine drip this morning.  Assessment & Plan:   Active Problems:   Lung blebs (HCC)   Persistent cognitive impairment   Pneumothorax, right   COPD (chronic obstructive pulmonary disease) (HCC)   Chronic respiratory failure with hypoxia (HCC)   Protein-calorie malnutrition, severe   1.  Acute on chronic hypoxic respiratory failure due to pneumothorax Right side pneumothorax Chest tube was inserted , cardiothoracic surgery was following.  He accidentally removed the chest tube in the early morning of 04/04/2021.  Because of ongoing respiratory distress and multiple comorbidities, family decided to initiate comfort care.  Palliative care has been consulted.  Started on morphine drip this morning.   2.  COPD.   On comfort care  3.  Anemia No active bleeding Will stop blood works  4.  Mood : He was on mirtazapine  5.  BPH : was on  Flomax  6.  Severe protein calorie malnutrition   Nutrition Problem: Severe Malnutrition Etiology: chronic illness (COPD, dementia)      DVT prophylaxis:None Code Status: Full comfort care Family Communication: family at bedside Status is: Inpatient  Remains inpatient appropriate  because:Hemodynamically unstable  Dispo: The patient is from: Home              Anticipated d/c is to: residential hospice                 Consultants: TCTS,palliative care  Procedures:Chest tube insertion  Antimicrobials:  Anti-infectives (From admission, onward)    Start     Dose/Rate Route Frequency Ordered Stop   03/20/21 1700  vancomycin (VANCOREADY) IVPB 750 mg/150 mL  Status:  Discontinued        750 mg 150 mL/hr over 60 Minutes Intravenous Every 24 hours 03/31/2021 1635 03/21/21 1059   03/20/21 0400  ceFEPIme (MAXIPIME) 2 g in sodium chloride 0.9 % 100 mL IVPB  Status:  Discontinued        2 g 200 mL/hr over 30 Minutes Intravenous Every 12 hours 04/04/2021 1629 03/22/21 1210   04/02/2021 1600  vancomycin (VANCOCIN) IVPB 1000 mg/200 mL premix        1,000 mg 200 mL/hr over 60 Minutes Intravenous  Once 03/14/2021 1553 03/22/2021 1757   04/09/2021 1600  ceFEPIme (MAXIPIME) 2 g in sodium chloride 0.9 % 100 mL IVPB        2 g 200 mL/hr over 30 Minutes Intravenous  Once 04/04/2021 1553 03/14/2021 1648       Subjective:  Patient seen and examined at the bedside and this morning.  He was agitated, looked in severe respiratory distress.  Family at the bedside requesting comfort measures.  He removed his chest tube early this morning.   Objective: Vitals:   04/03/21 2033 04/03/21 2313 04/04/21 0300 04/04/21 0714  BP:  Marland Kitchen)  132/54 138/82 (!) 144/72  Pulse:  89 (!) 102 90  Resp:  (!) 23 17 14   Temp:  98.2 F (36.8 C)  (!) 96 F (35.6 C)  TempSrc:  Oral  Axillary  SpO2: 100% 100% 98% 100%  Weight:      Height:        Intake/Output Summary (Last 24 hours) at 04/04/2021 6644 Last data filed at 04/03/2021 2000 Gross per 24 hour  Intake --  Output 395 ml  Net -395 ml   Filed Weights   03/21/2021 2331 03/21/21 0400 03/31/21 1335  Weight: 43.5 kg 45.9 kg 45.9 kg    Examination:  General exam: In moderate to severe respite distress, agitated Respiratory system: Bilateral wheezing,  crackles Cardiovascular system: Tachycardic.  Gastrointestinal system: Abdomen is nondistended, soft and nontender. Central nervous system: Not alert and oriented Extremities: No edema, no clubbing ,no cyanosis Skin: No rashes, no ulcers,no icterus      Data Reviewed: I have personally reviewed following labs and imaging studies  CBC: Recent Labs  Lab 03/29/21 0547 03/31/21 0659 04/02/21 0634  WBC 10.2 11.2* 12.0*  HGB 8.9* 8.7* 9.1*  HCT 28.8* 27.9* 28.9*  MCV 98.6 98.2 99.0  PLT 421* 416* 034*   Basic Metabolic Panel: Recent Labs  Lab 03/29/21 0547 03/31/21 0659 04/02/21 0634  NA 140 143 138  K 4.4 5.2* 4.5  CL 100 102 100  CO2 34* 34* 33*  GLUCOSE 115* 107* 99  BUN 42* 47* 37*  CREATININE 0.84 0.88 0.84  CALCIUM 8.5* 8.8* 8.7*   GFR: Estimated Creatinine Clearance: 48.6 mL/min (by C-G formula based on SCr of 0.84 mg/dL). Liver Function Tests: No results for input(s): AST, ALT, ALKPHOS, BILITOT, PROT, ALBUMIN in the last 168 hours. No results for input(s): LIPASE, AMYLASE in the last 168 hours. No results for input(s): AMMONIA in the last 168 hours. Coagulation Profile: No results for input(s): INR, PROTIME in the last 168 hours. Cardiac Enzymes: No results for input(s): CKTOTAL, CKMB, CKMBINDEX, TROPONINI in the last 168 hours. BNP (last 3 results) No results for input(s): PROBNP in the last 8760 hours. HbA1C: No results for input(s): HGBA1C in the last 72 hours. CBG: No results for input(s): GLUCAP in the last 168 hours. Lipid Profile: No results for input(s): CHOL, HDL, LDLCALC, TRIG, CHOLHDL, LDLDIRECT in the last 72 hours. Thyroid Function Tests: No results for input(s): TSH, T4TOTAL, FREET4, T3FREE, THYROIDAB in the last 72 hours. Anemia Panel: No results for input(s): VITAMINB12, FOLATE, FERRITIN, TIBC, IRON, RETICCTPCT in the last 72 hours. Sepsis Labs: No results for input(s): PROCALCITON, LATICACIDVEN in the last 168 hours.  No results found  for this or any previous visit (from the past 240 hour(s)).       Radiology Studies: DG CHEST PORT 1 VIEW  Result Date: 04/04/2021 CLINICAL DATA:  Right pneumothorax EXAM: PORTABLE CHEST 1 VIEW COMPARISON:  Earlier same day FINDINGS: Right pneumothorax has again increased in size. There is greater atelectasis of the right lung. Similar appearance of the left lung. Normal heart size. IMPRESSION: Further increase in size of right pneumothorax with greater right lung atelectasis. These results will be called to the ordering clinician or representative by the Radiologist Assistant, and communication documented in the PACS or [employer]. Electronically Signed   By: Macy Mis M.D.   On: 04/04/2021 08:03   DG Chest Port 1 View  Result Date: 04/04/2021 CLINICAL DATA:  Recently displaced chest tube. Pneumothorax follow-up EXAM: PORTABLE CHEST 1 VIEW  COMPARISON:  Earlier the same day FINDINGS: The right pneumothorax is increased, now 3 cm in thickness at the base where previously 1 cm. Pneumothorax continues laterally and apically. Gas under the diaphragm is best attributed to colon. Hyperinflation and emphysematous markings. Normal heart size. These results will be called to the ordering clinician or representative by the Radiologist Assistant, and communication documented in the PACS or [employer]. IMPRESSION: The right pneumothorax has increased from the film earlier today. Electronically Signed   By: Monte Fantasia M.D.   On: 04/04/2021 04:22   DG Chest Port 1 View  Result Date: 04/04/2021 CLINICAL DATA:  Acute respiratory distress, rapid response EXAM: PORTABLE CHEST 1 VIEW COMPARISON:  Radiograph 04/03/2021 FINDINGS: Persistent right apical pneumothorax similar to the minimally increased in size from comparison prior. No left pneumothorax. No clear layering pleural effusion with bibasilar areas of scarring. Surgical material noted in the left mid and upper lung. No new consolidative  opacity or developing edema. Stable cardiomediastinal contours with a calcified aorta. Background of hyperinflation and emphysematous changes. The osseous structures appear diffusely demineralized which may limit detection of small or nondisplaced fractures. No acute osseous abnormality or suspicious osseous lesion. Degenerative changes are present in the imaged spine and shoulders. No worrisome chest wall abnormality. Telemetry leads and external support devices overlie the chest. IMPRESSION: Persistent right apical pneumothorax, similar to slightly increased in size from prior. No other significant interval change. Electronically Signed   By: Lovena Le M.D.   On: 04/04/2021 01:04   DG Chest Port 1 View  Result Date: 04/03/2021 CLINICAL DATA:  Shortness of breath.  Pneumothorax. EXAM: PORTABLE CHEST 1 VIEW COMPARISON:  Lungs are hyperexpanded. Right pleural drain 04/02/2021 FINDINGS: Remains in place with stable persistent tiny right-sided pneumothorax. Interstitial markings are diffusely coarsened with chronic features. Stable scarring at the left base. The cardiopericardial silhouette is within normal limits for size. Bones are diffusely demineralized. Telemetry leads overlie the chest. IMPRESSION: No interval change. Persistent tiny right pneumothorax. Electronically Signed   By: Misty Stanley M.D.   On: 04/03/2021 09:01        Scheduled Meds:  chlorhexidine  15 mL Mouth Rinse BID   glycopyrrolate  0.1 mg Intravenous TID   mometasone-formoterol  2 puff Inhalation BID   Continuous Infusions:  morphine       LOS: 16 days    Time spent: 25 mins.More than 50% of that time was spent in counseling and/or coordination of care.      Shelly Coss, MD [facility] P7/25/2022, 9:09 AM

## 2021-04-04 NOTE — Progress Notes (Signed)
This nurse has spoke with Radiology over the phone and has been informed of the patients right lung atelectasis and the increase in size of pneumothorax not 3 cm in thickness.Will  notify MD.  Patient is currently on 4L HFNC and has just received IV morphine under my care.  Shelbie Proctor, RN

## 2021-04-04 NOTE — Progress Notes (Signed)
Palliative:  HPI: 75 year old male with a history of oxygen dependent COPD on 2-4L, prior history of pneumothorax, long-term resident of skilled nursing facility, admitted to the hospital with respiratory distress.  He was found to have large right-sided pneumothorax and had chest tube placed in the [facility] emergency room.  Pneumothorax resolved after chest tube placement, but has had a persistent air leak since then.  Palliative care following to help address goals of care.  General surgery is also been following for chest tube management. Patient was transferred to [facility] for cardiothoracic surgery/pulmonology consultation to see if there are any less invasive options to help manage pneumothorax.   I met today at Mr. Mcphillips' bedside with his daughter, son-in-law, and grandchildren. Mr. Cada continues to have labored breathing that has seemingly progressed since chest tube out over night. RN has just received morphine infusion and I assisted with recommendations to better ensure comfort. Family all confirm goals for full comfort care at this time. Discussed use of morphine infusion and likely need for frequent boluses until basal rate reaches equilibrium state to better provide comfort. Discussed use of Robinul for secretions. Other medications not adding to comfort and relief of shortness of breath will be stopped. Family note that he does have paradoxical reaction to Ativan so this will not be used. Haldol available for agitation/anxiety which can come in form of moaning or restlessness. He is having relief with morphine bolus and infusion. Orders adjusted to reflect full comfort care. Anticipate hospital death.   All questions/concerns addressed. Emotional support provided.   Exam: Unresponsive. Cachectic. Restless. Moaning. Breathing labored and tachypneic. Abd flat. Extremities cool to touch.   Plan: - Full comfort care.  - Morphine infusion 4 mg/hr with 4 mg bolus every 15 minutes  as needed for shortness of breath/pain. Basal and bolus dosage may be increased as needed to ensure comfort and relief.  - Robinul scheduled for secretion relief.  - Anticipate hospital death.   25 min  Vinie Sill, NP Palliative Medicine Team Pager [phone] (Please see [email or web address] for schedule) Team Phone [phone]    Greater than 50%  of this time was spent counseling and coordinating care related to the above assessment and plan

## 2021-04-04 NOTE — Progress Notes (Signed)
Palliative called again, Lu Duffel, RN left voicemail. Pt increased workload of breathing and visibly uncomfortable. Awaiting return call. Family at bedside.

## 2021-04-04 NOTE — Progress Notes (Signed)
Patient ID: Alan Huff, male   DOB: 11-14-1945, 75 y.o.   MRN: [id number] TCTS:  Pt pulled chest tube out late last night and CXR afterward at 12:20 am showed slight increase in right ptx. I decided to watch this for a few hrs to see if there was any progression and he developed increased respiratory distress requiring NRB. Followup CXR at 3:45 am showed further increase in right ptx basilar component. I came in to put a new chest tube in him but nurses talked with his daughter and she decided to make him comfort care only with no new chest tube given severe oxygen dependent COPD, prolonged air leak, no surgical options to stop air leak and no where for him to go with a chest tube and active leak. Palliative care has been following him.

## 2021-04-11 NOTE — Death Summary Note (Signed)
Death Summary  Million Maharaj JOI:[id number] DOB: 1946-05-26 DOA: 04-18-21  PCP: [facility]  Admit date: 18-Apr-2021 Date of Death: 05/05/21 Time of Death: 0813    History of present illness:  Patient is a 75 year old male with COPD, chronic respiratory failure on 3 L/min to 4 L/min home O2 history of pneumothorax, history of lung blebs, bronchial alveolar fistula status post pleurodesis.  He presented with respiratory distress and chest discomfort chest x-ray in the ER showed right chest pneumothorax.  Chest tube was inserted , cardiothoracic surgery was following.  He accidentally removed the chest tube in the early morning of 04/04/2021.  Because of ongoing respiratory distress and multiple comorbidities, family decided to initiate comfort care.  Palliative care has been consulted.  He was started on full comfort care.  Patient expired today.    Final Diagnoses:  1.   Acute on chronic hypoxic respiratory failure due to Rt sided pneumothorax   The results of significant diagnostics from this hospitalization (including imaging, microbiology, ancillary and laboratory) are listed below for reference.    Significant Diagnostic Studies: CT CHEST WO CONTRAST  Result Date: 03/23/2021 CLINICAL DATA:  75 year old male with COPD and pneumothorax. EXAM: CT CHEST WITHOUT CONTRAST TECHNIQUE: Multidetector CT imaging of the chest was performed following the standard protocol without IV contrast. COMPARISON:  Chest CT dated 10/28/2014 and radiograph dated 03/22/2021. FINDINGS: Evaluation of this exam is limited in the absence of intravenous contrast. Cardiovascular: There is no cardiomegaly. Small pericardial effusion. Coronary vascular calcification. There is moderate atherosclerotic calcification of the thoracic aorta. Evaluation of the aorta and central pulmonary arteries are limited on this noncontrast CT. Partially visualized 3.9 cm aneurysm of the abdominal aorta. Dedicated CT of the abdomen  pelvis with IV contrast is recommended for better evaluation. Mediastinum/Nodes: No definite hilar or mediastinal adenopathy. The esophagus is grossly unremarkable. No mediastinal fluid collection. Lungs/Pleura: Background of severe emphysema. Right apical subpleural scarring. Small bilateral pleural effusions with bibasilar atelectasis or infiltrate. There is a moderate size right pneumothorax, greater than 20%. A right-sided chest tube is noted with pigtail tip in the posterior pleural surface of the right upper lobe. The central airways are patent. Upper Abdomen: Colonic diverticulosis. Musculoskeletal: Osteopenia. No acute osseous pathology. Right chest wall soft tissue emphysema. No fluid collection. IMPRESSION: 1. Moderate-sized right pneumothorax, greater than 20%. A right-sided chest tube with pigtail tip in the posterior pleural surface of the right upper lobe. 2. Small bilateral pleural effusions with bibasilar atelectasis or infiltrate. 3. Partially visualized 3.9 cm abdominal aortic aneurysm. Dedicated CT of the abdomen pelvis with IV contrast is recommended for better evaluation. 4. Aortic Atherosclerosis (ICD10-I70.0) and Emphysema (ICD10-J43.9). Electronically Signed   By: Anner Crete M.D.   On: 03/23/2021 21:25   DG CHEST PORT 1 VIEW  Result Date: 04/04/2021 CLINICAL DATA:  Right pneumothorax EXAM: PORTABLE CHEST 1 VIEW COMPARISON:  Earlier same day FINDINGS: Right pneumothorax has again increased in size. There is greater atelectasis of the right lung. Similar appearance of the left lung. Normal heart size. IMPRESSION: Further increase in size of right pneumothorax with greater right lung atelectasis. These results will be called to the ordering clinician or representative by the Radiologist Assistant, and communication documented in the PACS or [employer]. Electronically Signed   By: Macy Mis M.D.   On: 04/04/2021 08:03   DG Chest Port 1 View  Result Date:  04/04/2021 CLINICAL DATA:  Recently displaced chest tube. Pneumothorax follow-up EXAM: PORTABLE CHEST 1 VIEW COMPARISON:  Earlier the same day FINDINGS: The right pneumothorax is increased, now 3 cm in thickness at the base where previously 1 cm. Pneumothorax continues laterally and apically. Gas under the diaphragm is best attributed to colon. Hyperinflation and emphysematous markings. Normal heart size. These results will be called to the ordering clinician or representative by the Radiologist Assistant, and communication documented in the PACS or [employer]. IMPRESSION: The right pneumothorax has increased from the film earlier today. Electronically Signed   By: Monte Fantasia M.D.   On: 04/04/2021 04:22   DG Chest Port 1 View  Result Date: 04/04/2021 CLINICAL DATA:  Acute respiratory distress, rapid response EXAM: PORTABLE CHEST 1 VIEW COMPARISON:  Radiograph 04/03/2021 FINDINGS: Persistent right apical pneumothorax similar to the minimally increased in size from comparison prior. No left pneumothorax. No clear layering pleural effusion with bibasilar areas of scarring. Surgical material noted in the left mid and upper lung. No new consolidative opacity or developing edema. Stable cardiomediastinal contours with a calcified aorta. Background of hyperinflation and emphysematous changes. The osseous structures appear diffusely demineralized which may limit detection of small or nondisplaced fractures. No acute osseous abnormality or suspicious osseous lesion. Degenerative changes are present in the imaged spine and shoulders. No worrisome chest wall abnormality. Telemetry leads and external support devices overlie the chest. IMPRESSION: Persistent right apical pneumothorax, similar to slightly increased in size from prior. No other significant interval change. Electronically Signed   By: Lovena Le M.D.   On: 04/04/2021 01:04   DG Chest Port 1 View  Result Date: 04/03/2021 CLINICAL DATA:   Shortness of breath.  Pneumothorax. EXAM: PORTABLE CHEST 1 VIEW COMPARISON:  Lungs are hyperexpanded. Right pleural drain 04/02/2021 FINDINGS: Remains in place with stable persistent tiny right-sided pneumothorax. Interstitial markings are diffusely coarsened with chronic features. Stable scarring at the left base. The cardiopericardial silhouette is within normal limits for size. Bones are diffusely demineralized. Telemetry leads overlie the chest. IMPRESSION: No interval change. Persistent tiny right pneumothorax. Electronically Signed   By: Misty Stanley M.D.   On: 04/03/2021 09:01   DG CHEST PORT 1 VIEW  Result Date: 04/02/2021 CLINICAL DATA:  Shortness of breath. Evaluate right chest tube and pneumothorax. EXAM: PORTABLE CHEST 1 VIEW COMPARISON:  April 01, 2021 FINDINGS: The right chest tube is stable. The right-sided pneumothorax measures 11 mm in maximum thickness today versus 15 mm yesterday, improved. No left-sided pneumothorax. The heart, hila, and mediastinum are unremarkable. No pulmonary nodules or masses are identified. Hyperinflation of the lungs remains consistent with history of emphysema. Mild opacity in the left base is probably atelectasis and perhaps a small associated effusion. No other abnormalities. IMPRESSION: 1. The right chest tube is stable. The right-sided pneumothorax is smaller in the interval. Recommend attention on follow-up. 2. Emphysematous changes in the lungs. 3. Mild opacity in the left base favored represent atelectasis, perhaps with a small associated effusion. Electronically Signed   By: Dorise Bullion III M.D   On: 04/02/2021 09:02   DG CHEST PORT 1 VIEW  Result Date: 04/01/2021 CLINICAL DATA:  Follow-up pneumothorax EXAM: PORTABLE CHEST 1 VIEW COMPARISON:  03/31/2021, 03/30/2021, 03/29/2021 FINDINGS: Right-sided chest tube remains in place. Small right apicolateral and basilar pneumothorax, stable to minimally increased in size. Emphysema. Patchy atelectasis or  scarring at the bases. Normal cardiac size. IMPRESSION: 1. Stable to minimal increase in size of residual small right apical and basilar pneumothorax. 2. Emphysema with scarring or atelectasis at the bases Electronically Signed   By: Maudie Mercury  Francoise Ceo M.D.   On: 04/01/2021 03:56   DG CHEST PORT 1 VIEW  Result Date: 03/31/2021 CLINICAL DATA:  Shortness of breath.  Pneumothorax. EXAM: PORTABLE CHEST 1 VIEW COMPARISON:  03/30/2021. FINDINGS: Right chest tube in stable position. Stable small right pneumothorax again noted. Heart size stable. COPD. Persistent bibasilar atelectasis and or scarring. Skin folds noted on the left. Tiny right pleural effusion versus pleural scarring. IMPRESSION: 1. Right chest tube in stable position. Stable small right pneumothorax again noted. 2.  COPD with persistent bibasilar atelectasis and or scarring. Electronically Signed   By: Marcello Moores  Register   On: 03/31/2021 07:01   DG CHEST PORT 1 VIEW  Result Date: 03/30/2021 CLINICAL DATA:  Short of breath EXAM: PORTABLE CHEST 1 VIEW COMPARISON:  03/30/2021 FINDINGS: Single frontal view of the chest demonstrates stable indwelling right pigtail drainage catheter unchanged in position. There is a persistent but decreased trace right apical pneumothorax, volume estimated far less than 5%. Background scarring again seen throughout the lungs compatible with emphysema. No airspace disease or effusion. No acute bony abnormalities. IMPRESSION: 1. Persistent but decreased right apical pneumothorax, volume estimated far less than 5%. 2. Stable emphysema. Electronically Signed   By: Randa Ngo M.D.   On: 03/30/2021 18:43   DG CHEST PORT 1 VIEW  Result Date: 03/30/2021 CLINICAL DATA:  Right pneumothorax. EXAM: PORTABLE CHEST 1 VIEW COMPARISON:  03/29/2021.  01/04/2015.  CT 03/23/2021. FINDINGS: Right chest tube in stable position. Stable small right pneumothorax. Heart size normal. Stable chronic interstitial changes. Stable bibasilar  atelectasis. Stable basilar pleural thickening consistent scarring. IMPRESSION: Right chest tube in stable position. Stable small right pneumothorax. Electronically Signed   By: Marcello Moores  Register   On: 03/30/2021 06:25   DG CHEST PORT 1 VIEW  Result Date: 03/29/2021 CLINICAL DATA:  Follow-up chest tube EXAM: PORTABLE CHEST 1 VIEW COMPARISON:  Film from the previous day. FINDINGS: Pigtail catheter is again noted on the right. Stable right pneumothorax is noted. Cardiac shadow is stable. The lungs are well aerated with mild bilateral scarring. New focal abnormality is noted. IMPRESSION: Stable right apical pneumothorax with chest tube in place. Electronically Signed   By: Inez Catalina M.D.   On: 03/29/2021 09:13   DG CHEST PORT 1 VIEW  Result Date: 03/28/2021 CLINICAL DATA:  Right pneumothorax. EXAM: PORTABLE CHEST 1 VIEW COMPARISON:  March 27, 2021. FINDINGS: The heart size and mediastinal contours are within normal limits. Right-sided chest tube is unchanged in position. Stable small right apical pneumothorax is noted. Stable bibasilar atelectasis or scarring is noted. The visualized skeletal structures are unremarkable. IMPRESSION: Stable position of right-sided chest tube. Stable small right apical pneumothorax. Electronically Signed   By: Marijo Conception M.D.   On: 03/28/2021 08:36   DG CHEST PORT 1 VIEW  Result Date: 03/27/2021 CLINICAL DATA:  Pneumothorax EXAM: PORTABLE CHEST 1 VIEW COMPARISON:  None. FINDINGS: Normal cardiac silhouette.  Lungs are hyperinflated. Small bore RIGHT chest tube unchanged in position. Trace RIGHT lateral pneumothorax noted measuring 7 mm from the lateral chest wall. Improvement in subcutaneous gas seen on comparison exam. IMPRESSION: 1. Trace RIGHT pneumothorax persists. 2. RIGHT chest tube in place. 3. Improvement in subcutaneous gas from comparison exam. 4. Hyperinflated lungs. Electronically Signed   By: [name] M.D.   On: 03/27/2021 08:15   DG Chest Port 1  View  Result Date: 03/22/2021 CLINICAL DATA:  Right chest tube.  Follow-up pneumothorax. EXAM: PORTABLE CHEST 1 VIEW COMPARISON:  03/20/2021 FINDINGS: Right  pigtail thoracostomy tube remains in place. Tiny amount of pleural air remains along the lateral margin. Subcutaneous air remains visible. Heart size is normal. Mediastinal shadows are otherwise normal. There is mild atelectasis at the left lung base. Emphysema and pulmonary scarring as seen previously. IMPRESSION: Right chest tube remains in place. Small amount of pleural air in the lateral pleural space. Subcutaneous emphysema. Mild left base atelectasis. Underlying emphysema pulmonary scarring. Electronically Signed   By: Nelson Chimes M.D.   On: 03/22/2021 07:57   DG Chest Port 1 View  Result Date: 03/20/2021 CLINICAL DATA:  Pneumothorax.  Former smoker. EXAM: PORTABLE CHEST 1 VIEW COMPARISON:  Chest x-rays dated 04/04/2021. FINDINGS: RIGHT-sided chest tube is stable in position. No residual pneumothorax seen. Lungs are hyperexpanded. Chronic bronchitic changes centrally. Coarse lung markings are seen bilaterally. No confluent opacity to suggest a superimposed pneumonia or pulmonary edema. No pleural effusion is seen. Heart size and mediastinal contours are within normal limits. IMPRESSION: 1. RIGHT-sided chest tube is stable in position. No residual pneumothorax seen. 2. No active disease. No evidence of pneumonia or pulmonary edema. 3. Hyperexpanded lungs indicating COPD. Associated chronic bronchitic changes and evidence of chronic interstitial lung disease/fibrosis. Electronically Signed   By: Franki Cabot M.D.   On: 03/20/2021 05:54   DG Chest Port 1 View  Result Date: 03/26/2021 CLINICAL DATA:  Right-sided chest tube placement EXAM: PORTABLE CHEST 1 VIEW COMPARISON:  04/10/2021, 4:27 p.m. FINDINGS: Interval placement of a right-sided pigtail chest tube with re-expansion of the right lung and resolution of a previously seen approximately 50%  right pneumothorax. There is no significant residual pneumothorax. Small volume subcutaneous emphysema about the right chest wall. The left lung is normally aerated. Heart and mediastinum are unremarkable. IMPRESSION: Interval placement of a right-sided pigtail chest tube with re-expansion of the right lung and resolution of a previously seen approximately 50% right pneumothorax. There is no significant residual pneumothorax. Electronically Signed   By: Eddie Candle M.D.   On: 03/18/2021 18:32   DG Chest Port 1 View  Result Date: 03/25/2021 CLINICAL DATA:  Questionable sepsis.  Shortness of breath EXAM: PORTABLE CHEST 1 VIEW COMPARISON:  12/25/2014 FINDINGS: There is a large right pneumothorax, approximately 50%. There is hyperinflation of the lungs compatible with COPD. No confluent opacity on the left. Heart is normal size. Aortic calcifications. IMPRESSION: Emphysema. Large right pneumothorax, approximately 50%. Critical Value/emergent results were called by telephone at the time of interpretation on 04/08/2021 at 4:56 pm to provider [facility] , who verbally acknowledged these results. Electronically Signed   By: Rolm Baptise M.D.   On: 03/25/2021 16:58    Microbiology: No results found for this or any previous visit (from the past 240 hour(s)).   Labs: Basic Metabolic Panel: Recent Labs  Lab 03/31/21 0659 04/02/21 0634  NA 143 138  K 5.2* 4.5  CL 102 100  CO2 34* 33*  GLUCOSE 107* 99  BUN 47* 37*  CREATININE 0.88 0.84  CALCIUM 8.8* 8.7*   Liver Function Tests: No results for input(s): AST, ALT, ALKPHOS, BILITOT, PROT, ALBUMIN in the last 168 hours. No results for input(s): LIPASE, AMYLASE in the last 168 hours. No results for input(s): AMMONIA in the last 168 hours. CBC: Recent Labs  Lab 03/31/21 0659 04/02/21 0634  WBC 11.2* 12.0*  HGB 8.7* 9.1*  HCT 27.9* 28.9*  MCV 98.2 99.0  PLT 416* 425*   Cardiac Enzymes: No results for input(s): CKTOTAL, CKMB, CKMBINDEX, TROPONINI  in the last  168 hours. D-Dimer No results for input(s): DDIMER in the last 72 hours. BNP: Invalid input(s): POCBNP CBG: No results for input(s): GLUCAP in the last 168 hours. Anemia work up No results for input(s): VITAMINB12, FOLATE, FERRITIN, TIBC, IRON, RETICCTPCT in the last 72 hours. Urinalysis    Component Value Date/Time   COLORURINE YELLOW 03/13/2021 2345   APPEARANCEUR HAZY (A) 04/08/2021 2345   LABSPEC 1.018 03/14/2021 2345   PHURINE 6.0 03/14/2021 2345   GLUCOSEU 50 (A) 03/13/2021 2345   HGBUR NEGATIVE 03/23/2021 2345   BILIRUBINUR NEGATIVE 03/31/2021 2345   KETONESUR 5 (A) 04/08/2021 2345   PROTEINUR 30 (A) 03/25/2021 2345   UROBILINOGEN 0.2 11/04/2014 1239   NITRITE NEGATIVE 03/29/2021 2345   LEUKOCYTESUR MODERATE (A) 03/17/2021 2345   Sepsis Labs Invalid input(s): PROCALCITONIN,  WBC,  LACTICIDVEN     SIGNED:  Shelly Coss, MD  [facility] 26-Apr-2021, 10:50 AM Pager [phone]  If 7PM-7AM, please contact night-coverage [email or web address] Password [email or web address]

## 2021-04-11 NOTE — Progress Notes (Signed)
Morphine Sulfate 53mL plus amount in tubing, wasted in stericycle container with Carole Binning, RN.

## 2021-04-11 NOTE — Progress Notes (Signed)
Nutrition Brief Note ° °Chart reviewed. °Pt now transitioning to comfort care.  °No further nutrition interventions planned at this time.  °Please re-consult as needed. ° ° °Kate Nickolaus Bordelon, MS, RD, LDN °Inpatient Clinical Dietitian °Please see [facility] for contact information. ° ° °

## 2021-04-11 DEATH — deceased
